# Patient Record
Sex: Female | Born: 1935 | Race: Black or African American | Hispanic: No | Smoking: Former smoker
Health system: Southern US, Community
[De-identification: ages and names within clinical notes are randomized; demographics above are authoritative.]

## PROBLEM LIST (undated history)

## (undated) DIAGNOSIS — E079 Disorder of thyroid, unspecified: Secondary | ICD-10-CM

## (undated) DIAGNOSIS — R569 Unspecified convulsions: Secondary | ICD-10-CM

## (undated) DIAGNOSIS — S065X9A Traumatic subdural hemorrhage with loss of consciousness of unspecified duration, initial encounter: Secondary | ICD-10-CM

## (undated) DIAGNOSIS — B370 Candidal stomatitis: Secondary | ICD-10-CM

## (undated) DIAGNOSIS — D890 Polyclonal hypergammaglobulinemia: Secondary | ICD-10-CM

## (undated) DIAGNOSIS — S065XAA Traumatic subdural hemorrhage with loss of consciousness status unknown, initial encounter: Secondary | ICD-10-CM

## (undated) DIAGNOSIS — R627 Adult failure to thrive: Secondary | ICD-10-CM

## (undated) DIAGNOSIS — F0390 Unspecified dementia without behavioral disturbance: Secondary | ICD-10-CM

## (undated) HISTORY — PX: OTHER SURGICAL HISTORY: SHX169

## (undated) HISTORY — DX: Unspecified convulsions: R56.9

---

## 2006-10-18 ENCOUNTER — Emergency Department (HOSPITAL_COMMUNITY): Admission: EM | Admit: 2006-10-18 | Discharge: 2006-10-18 | Payer: Self-pay | Admitting: Emergency Medicine

## 2011-04-21 ENCOUNTER — Emergency Department (HOSPITAL_COMMUNITY): Payer: Medicare Other

## 2011-04-21 ENCOUNTER — Inpatient Hospital Stay (HOSPITAL_COMMUNITY): Payer: Medicare Other

## 2011-04-21 ENCOUNTER — Inpatient Hospital Stay (HOSPITAL_COMMUNITY)
Admission: EM | Admit: 2011-04-21 | Discharge: 2011-04-28 | DRG: 480 | Disposition: A | Discharge status: Skilled Nursing Facility | Payer: Medicare Other | Attending: Internal Medicine | Admitting: Internal Medicine

## 2011-04-21 DIAGNOSIS — J189 Pneumonia, unspecified organism: Secondary | ICD-10-CM | POA: Diagnosis not present

## 2011-04-21 DIAGNOSIS — D649 Anemia, unspecified: Secondary | ICD-10-CM

## 2011-04-21 DIAGNOSIS — S72143A Displaced intertrochanteric fracture of unspecified femur, initial encounter for closed fracture: Principal | ICD-10-CM | POA: Diagnosis present

## 2011-04-21 DIAGNOSIS — Y92009 Unspecified place in unspecified non-institutional (private) residence as the place of occurrence of the external cause: Secondary | ICD-10-CM

## 2011-04-21 DIAGNOSIS — E876 Hypokalemia: Secondary | ICD-10-CM | POA: Diagnosis not present

## 2011-04-21 DIAGNOSIS — R404 Transient alteration of awareness: Secondary | ICD-10-CM

## 2011-04-21 DIAGNOSIS — N390 Urinary tract infection, site not specified: Secondary | ICD-10-CM | POA: Diagnosis present

## 2011-04-21 DIAGNOSIS — R5381 Other malaise: Secondary | ICD-10-CM | POA: Diagnosis present

## 2011-04-21 DIAGNOSIS — D62 Acute posthemorrhagic anemia: Secondary | ICD-10-CM | POA: Diagnosis not present

## 2011-04-21 DIAGNOSIS — W010XXA Fall on same level from slipping, tripping and stumbling without subsequent striking against object, initial encounter: Secondary | ICD-10-CM | POA: Diagnosis present

## 2011-04-21 DIAGNOSIS — E8809 Other disorders of plasma-protein metabolism, not elsewhere classified: Secondary | ICD-10-CM | POA: Diagnosis present

## 2011-04-21 DIAGNOSIS — R222 Localized swelling, mass and lump, trunk: Secondary | ICD-10-CM

## 2011-04-21 DIAGNOSIS — F0390 Unspecified dementia without behavioral disturbance: Secondary | ICD-10-CM | POA: Diagnosis present

## 2011-04-21 DIAGNOSIS — E039 Hypothyroidism, unspecified: Secondary | ICD-10-CM | POA: Diagnosis present

## 2011-04-21 DIAGNOSIS — I498 Other specified cardiac arrhythmias: Secondary | ICD-10-CM | POA: Diagnosis present

## 2011-04-21 HISTORY — PX: OTHER SURGICAL HISTORY: SHX169

## 2011-04-21 LAB — BASIC METABOLIC PANEL
BUN: 8 mg/dL (ref 6–23)
Calcium: 9.6 mg/dL (ref 8.4–10.5)
Creatinine, Ser: <0.47 mg/dL — ABNORMAL LOW (ref 0.50–1.10)
Glucose, Bld: 115 mg/dL — ABNORMAL HIGH (ref 70–99)
Sodium: 138 mEq/L (ref 135–145)

## 2011-04-21 LAB — POCT I-STAT, CHEM 8
Chloride: 105 mEq/L (ref 96–112)
HCT: 35 % — ABNORMAL LOW (ref 36.0–46.0)
Hemoglobin: 11.9 g/dL — ABNORMAL LOW (ref 12.0–15.0)

## 2011-04-21 LAB — URINALYSIS, ROUTINE W REFLEX MICROSCOPIC
Glucose, UA: NEGATIVE mg/dL
Ketones, ur: NEGATIVE mg/dL
Leukocytes, UA: NEGATIVE
Specific Gravity, Urine: 1.015 (ref 1.005–1.030)
Urobilinogen, UA: 0.2 mg/dL (ref 0.0–1.0)
pH: 7 (ref 5.0–8.0)

## 2011-04-21 LAB — CARDIAC PANEL(CRET KIN+CKTOT+MB+TROPI)
CK, MB: 1.9 ng/mL (ref 0.3–4.0)
Relative Index: INVALID (ref 0.0–2.5)
Relative Index: 2.5 (ref 0.0–2.5)
Total CK: 103 U/L (ref 7–177)
Troponin I: <0.3 ng/mL (ref <0.30)

## 2011-04-21 LAB — CBC
Hemoglobin: 10.5 g/dL — ABNORMAL LOW (ref 12.0–15.0)
MCHC: 30.4 g/dL (ref 30.0–36.0)
MCV: 81.8 fL (ref 78.0–100.0)
Platelets: 347 10*3/uL (ref 150–400)
Platelets: 340 10*3/uL (ref 150–400)
RDW: 16.2 % — ABNORMAL HIGH (ref 11.5–15.5)
WBC: 6.7 10*3/uL (ref 4.0–10.5)
WBC: 7 10*3/uL (ref 4.0–10.5)

## 2011-04-21 LAB — LIPID PANEL
Triglycerides: 79 mg/dL (ref <150)
VLDL: 16 mg/dL (ref 0–40)

## 2011-04-21 LAB — CK TOTAL AND CKMB (NOT AT ARMC): Total CK: 40 U/L (ref 7–177)

## 2011-04-21 LAB — ABO/RH: ABO/RH(D): A POS

## 2011-04-21 LAB — DIFFERENTIAL
Basophils Relative: 0 % (ref 0–1)
Lymphocytes Relative: 30 % (ref 12–46)

## 2011-04-21 LAB — SURGICAL PCR SCREEN: MRSA, PCR: NEGATIVE

## 2011-04-21 LAB — SAMPLE TO BLOOD BANK

## 2011-04-21 LAB — TSH: TSH: 8.002 u[IU]/mL — ABNORMAL HIGH (ref 0.350–4.500)

## 2011-04-21 MED ORDER — IOHEXOL 300 MG/ML  SOLN
100.0000 mL | Freq: Once | INTRAMUSCULAR | Status: AC | PRN
  Administered 2011-04-21: 100 mL via INTRAVENOUS

## 2011-04-22 LAB — COMPREHENSIVE METABOLIC PANEL
ALT: 8 U/L (ref 0–35)
AST: 20 U/L (ref 0–37)
Albumin: 2.6 g/dL — ABNORMAL LOW (ref 3.5–5.2)
CO2: 28 mEq/L (ref 19–32)
Calcium: 8.8 mg/dL (ref 8.4–10.5)
Chloride: 104 mEq/L (ref 96–112)
Creatinine, Ser: 0.58 mg/dL (ref 0.50–1.10)
GFR calc non Af Amer: >60 mL/min (ref >60)
Sodium: 138 mEq/L (ref 135–145)

## 2011-04-22 LAB — IRON AND TIBC
Saturation Ratios: 12 % — ABNORMAL LOW (ref 20–55)
TIBC: 181 ug/dL — ABNORMAL LOW (ref 250–470)
UIBC: 160 ug/dL

## 2011-04-22 LAB — T4, FREE: Free T4: 0.94 ng/dL (ref 0.80–1.80)

## 2011-04-22 LAB — DIFFERENTIAL
Basophils Relative: 0 % (ref 0–1)
Eosinophils Absolute: 0 10*3/uL (ref 0.0–0.7)
Eosinophils Relative: 0 % (ref 0–5)
Neutrophils Relative %: 69 % (ref 43–77)

## 2011-04-22 LAB — HEMOGLOBIN A1C
Hgb A1c MFr Bld: 5.6 % (ref <5.7)
Mean Plasma Glucose: 114 mg/dL (ref <117)

## 2011-04-22 LAB — TSH: TSH: 8.434 u[IU]/mL — ABNORMAL HIGH (ref 0.350–4.500)

## 2011-04-22 LAB — CBC
MCH: 25.9 pg — ABNORMAL LOW (ref 26.0–34.0)
MCV: 81.4 fL (ref 78.0–100.0)
Platelets: 293 10*3/uL (ref 150–400)
RBC: 3.44 MIL/uL — ABNORMAL LOW (ref 3.87–5.11)
RDW: 15.5 % (ref 11.5–15.5)
WBC: 7.7 10*3/uL (ref 4.0–10.5)

## 2011-04-23 ENCOUNTER — Other Ambulatory Visit (HOSPITAL_COMMUNITY): Payer: Medicare Other

## 2011-04-23 DIAGNOSIS — R222 Localized swelling, mass and lump, trunk: Secondary | ICD-10-CM

## 2011-04-23 DIAGNOSIS — R404 Transient alteration of awareness: Secondary | ICD-10-CM

## 2011-04-23 LAB — BASIC METABOLIC PANEL
CO2: 27 mEq/L (ref 19–32)
Calcium: 8.4 mg/dL (ref 8.4–10.5)
Creatinine, Ser: 0.5 mg/dL (ref 0.50–1.10)
GFR calc non Af Amer: >60 mL/min (ref >60)
Sodium: 138 mEq/L (ref 135–145)

## 2011-04-23 LAB — CBC
MCH: 26.7 pg (ref 26.0–34.0)
MCHC: 33 g/dL (ref 30.0–36.0)
MCV: 80.9 fL (ref 78.0–100.0)
Platelets: 238 10*3/uL (ref 150–400)
RBC: 2.77 MIL/uL — ABNORMAL LOW (ref 3.87–5.11)
RDW: 15.6 % — ABNORMAL HIGH (ref 11.5–15.5)

## 2011-04-23 LAB — PREPARE RBC (CROSSMATCH)

## 2011-04-24 DIAGNOSIS — R509 Fever, unspecified: Secondary | ICD-10-CM

## 2011-04-24 DIAGNOSIS — J984 Other disorders of lung: Secondary | ICD-10-CM

## 2011-04-24 LAB — CROSSMATCH
ABO/RH(D): A POS
Antibody Screen: NEGATIVE
Unit division: 0

## 2011-04-24 LAB — BASIC METABOLIC PANEL
CO2: 27 mEq/L (ref 19–32)
Calcium: 8.7 mg/dL (ref 8.4–10.5)
Creatinine, Ser: <0.47 mg/dL — ABNORMAL LOW (ref 0.50–1.10)

## 2011-04-24 LAB — CBC
MCH: 26.3 pg (ref 26.0–34.0)
MCV: 82.8 fL (ref 78.0–100.0)
Platelets: 265 10*3/uL (ref 150–400)
RDW: 15.7 % — ABNORMAL HIGH (ref 11.5–15.5)
WBC: 10.4 10*3/uL (ref 4.0–10.5)

## 2011-04-24 LAB — PROTEIN ELECTROPH W RFLX QUANT IMMUNOGLOBULINS
Alpha-1-Globulin: 7.3 % — ABNORMAL HIGH (ref 2.9–4.9)
Gamma Globulin: 18.9 % — ABNORMAL HIGH (ref 11.1–18.8)
M-Spike, %: NOT DETECTED g/dL
Total Protein ELP: 5.7 g/dL — ABNORMAL LOW (ref 6.0–8.3)

## 2011-04-24 LAB — IGG, IGA, IGM: IgG (Immunoglobin G), Serum: 1130 mg/dL (ref 690–1700)

## 2011-04-24 NOTE — Op Note (Signed)
NAMEMEGHAM, DWYER                 ACCOUNT NO.:  [id number]  MEDICAL RECORD NO.:  [id number]  LOCATION:  1509                         FACILITY:  [facility]  PHYSICIAN:  Vanita Panda. Magnus Ivan, M.D.DATE OF BIRTH:  Nov 19, 1931  DATE OF PROCEDURE:  04/21/2011 DATE OF DISCHARGE:                              OPERATIVE REPORT   PREOPERATIVE DIAGNOSES:  Left acute versus subacute femoral neck/intratrochanteric femur fracture, approximately 6-18 weeks old.  POSTOPERATIVE DIAGNOSES:  Left acute versus subacute femoral neck/intratrochanteric femur fracture, approximately 49-80 weeks old.  PROCEDURE:  Open reduction and internal fixation of left hip/intertochanteric femur fracture.  IMPLANTS:  [employer]hew 10 x 34 left intramedullary nail with 85 mm lag screw and compression screw.  SURGEON:  Vanita Panda. Magnus Ivan, MD  ANESTHESIA:  General.  ANTIBIOTICS:  Ancef IV 1 g.  BLOOD LOSS:  600 cc.  FLUID:  One unit of packed red blood cells.  COMPLICATIONS:  None.  INDICATIONS:  Ms. Mcginniss is a 77 year old female who lives alone except for a younger gentleman who is a son-like figure to her.  He has lived with her the last 10 years.  Over several weeks now, she has had difficulty ambulating and multiple falls.  After finding her out of bed and on the ground again early this morning, he brought her to the emergency room.  She was found to have a left hip fracture.  There was early callus formation, so I suggested that this fracture had the subacute component to it as well.  I talked to the patient's caregiver at length and he said that she had not been walking for about 3 weeks and that leg had been swelling and she had been favoring it and he had tried to do the best he could to take care of it at home.  It is at the point where a surgical intervention is warranted.  I explained to him the very difficult nature of fracture such as this, in fact it would need a very large incision and  instrumentation to fix this, but likely I would still keep her nonweightbearing for sometime.  The risks and benefits of this were explained to him in detail and the desire was to proceed with surgery.  PROCEDURE DESCRIPTION:  After informed consent was obtained, appropriate left hip was marked, she was brought to the operating room, and general anesthesia was obtained while she was on the stretcher.  She was then placed on a fracture table with the operative leg in midline skeletal traction and a perineal post placed and the right hip flexed and abducted out of the field with a stirrup placed and appropriate padding. Under direct fluoroscopic guidance, I was able to pull traction and found the fracture to be somewhat loose enough to get it in decent alignment.  We then prepped the left hip with DuraPrep and sterile drapes.  A time-out was called and she was identified as the correct patient and correct left hip.  I then made a large extensile excision due to the nature of the greater trochanteric piece and dissected down and divided the IT band.  Components of the fracture had healed and it was difficult  to get a lot of this reduced, but I got in close enough of vicinity to be able to put a guide pin from the greater trochanter and drill this down into the femoral shaft.  I then opened up the femoral canal with initiating reamer.  Prior to surgery, I was able to make a size measurement to determine the size rod.  I then placed an intramedullary nail in antegrade fashion from the greater trochanter down into the femoral canal, verified under fluoroscopic guidance. Through a separate small incision further down the thigh, we were able to place a guide pin traversing the fracture and the femoral neck into the head.  We then drilled for an 85 mm lag screw and placed this lag screw in the compression format with all traction out without difficulty.  The hip did move as a unit following this.   I was pleased with the reduction that we were able to obtain given the nature of this fracture.  I then copiously irrigated the tissues and closed the deep tissue with #1 Vicryl followed by 2-0 Vicryl in subcutaneous tissue and interrupted staples on the skin.  A well-padded sterile dressing was then applied.  She was awakened, extubated, and taken off the fracture table in stable condition.  Postoperatively, I will not allow any type of weightbearing while she heals this and she will likely need skilled nursing placement.     Vanita Panda. Magnus Ivan, M.D.     CYB/MEDQ  D:  04/21/2011  T:  04/22/2011  Job:  [id number]  Electronically Signed by Doneen Poisson M.D. on 04/24/2011 06:03:20 PM

## 2011-04-25 LAB — CBC
MCH: 26.5 pg (ref 26.0–34.0)
MCV: 83.4 fL (ref 78.0–100.0)
Platelets: 296 10*3/uL (ref 150–400)
RDW: 16 % — ABNORMAL HIGH (ref 11.5–15.5)

## 2011-04-25 LAB — URINE CULTURE
Colony Count: >=100000
Culture  Setup Time: 201207211107

## 2011-04-25 LAB — CARDIAC PANEL(CRET KIN+CKTOT+MB+TROPI): Total CK: 324 U/L — ABNORMAL HIGH (ref 7–177)

## 2011-04-25 LAB — BASIC METABOLIC PANEL
Calcium: 8.5 mg/dL (ref 8.4–10.5)
Creatinine, Ser: 0.52 mg/dL (ref 0.50–1.10)
GFR calc Af Amer: >60 mL/min (ref >60)
GFR calc non Af Amer: >60 mL/min (ref >60)

## 2011-04-25 LAB — MAGNESIUM: Magnesium: 2.3 mg/dL (ref 1.5–2.5)

## 2011-04-26 ENCOUNTER — Inpatient Hospital Stay (HOSPITAL_COMMUNITY): Payer: Medicare Other

## 2011-04-26 LAB — CARDIAC PANEL(CRET KIN+CKTOT+MB+TROPI)
CK, MB: 2.1 ng/mL (ref 0.3–4.0)
Relative Index: 0.9 (ref 0.0–2.5)
Relative Index: 0.9 (ref 0.0–2.5)
Total CK: 234 U/L — ABNORMAL HIGH (ref 7–177)
Troponin I: <0.3 ng/mL (ref <0.30)
Troponin I: <0.3 ng/mL (ref <0.30)

## 2011-04-26 LAB — BASIC METABOLIC PANEL
BUN: 8 mg/dL (ref 6–23)
Calcium: 8.8 mg/dL (ref 8.4–10.5)
Chloride: 104 mEq/L (ref 96–112)
Creatinine, Ser: 0.52 mg/dL (ref 0.50–1.10)
GFR calc Af Amer: >60 mL/min (ref >60)
GFR calc non Af Amer: >60 mL/min (ref >60)

## 2011-04-26 LAB — FERRITIN: Ferritin: 304 ng/mL — ABNORMAL HIGH (ref 10–291)

## 2011-04-26 LAB — URINALYSIS, ROUTINE W REFLEX MICROSCOPIC
Bilirubin Urine: NEGATIVE
Leukocytes, UA: NEGATIVE
Nitrite: NEGATIVE
Specific Gravity, Urine: 1.019 (ref 1.005–1.030)
Urobilinogen, UA: 1 mg/dL (ref 0.0–1.0)
pH: 7.5 (ref 5.0–8.0)

## 2011-04-26 LAB — CBC
Hemoglobin: 7.8 g/dL — ABNORMAL LOW (ref 12.0–15.0)
MCHC: 31.7 g/dL (ref 30.0–36.0)
RBC: 2.96 MIL/uL — ABNORMAL LOW (ref 3.87–5.11)

## 2011-04-26 LAB — IRON AND TIBC
Saturation Ratios: 22 % (ref 20–55)
UIBC: 118 ug/dL

## 2011-04-26 LAB — MAGNESIUM: Magnesium: 2.2 mg/dL (ref 1.5–2.5)

## 2011-04-27 LAB — HEMOGLOBIN AND HEMATOCRIT, BLOOD: HCT: 27.1 % — ABNORMAL LOW (ref 36.0–46.0)

## 2011-04-28 LAB — URINE CULTURE
Colony Count: NO GROWTH
Culture  Setup Time: 201207270136

## 2011-04-28 NOTE — H&P (Signed)
Kristi Webb, Kristi Webb                 ACCOUNT NO.:  [id number]  MEDICAL RECORD NO.:  [id number]  LOCATION:  1509                         FACILITY:  [facility]  PHYSICIAN:  Della Goo, M.D. DATE OF BIRTH:  08-Mar-1932  DATE OF ADMISSION:  04/21/2011 DATE OF DISCHARGE:                             HISTORY & PHYSICAL   DATE OF ADMISSION:  April 21, 2011.  PRIMARY CARE PHYSICIAN:  None.  CHIEF COMPLAINT:  Fall, left hip pain.  HISTORY OF PRESENT ILLNESS:  This is a 77 year old female, who was brought to the Emergency Department after suffering a fall this evening at about 1:00 a.m.  Her son found her on the floor and patient could not get up.  Patient has had increased falls over the past few weeks. Patient's son states that he has found her on the floor many times and has had to take her back up.  He bought a wheelchair this past week and has had her in the wheelchair secondary to her having difficulty walking.Marland Kitchen  He states that in the past few weeks when she has fallen, he has found swollen areas on her hips and has placed ice packs on them and these areas would get better.  He reports this evening when he picked her up, she was more agitated and uncomfortable, so he brought her to the Emergency Department for further evaluation.  Patient was seen in the Emergency Department and she was evaluated and found to be confused.  When asked, the son states that she has had worsening confusion and problems with her memory over the past 6-8 months.  Her son also reports that she had been in good health prior to this and had not been going to a physician or had not had to be hospitalized.  PAST MEDICAL HISTORY:  Prior to this, none.  PAST SURGICAL HISTORY:  None.  MEDICATIONS: 1. Centrum Silver. 2. Multivitamin. 3. Vitamin D3. 4. Milk thistle. 5. Lecithin. 6. Aleve. 7. Aspirin.  ALLERGIES:  No known drug allergies.  SOCIAL HISTORY:  Patient is a widow.  She retired from [employer].  She had worked there for 43 years.  She is a former smoker.  She quit recently 3 months ago, prior to this, she had smoked for 35 years, one pack a day.  She is a nondrinker.  No history of illicit drug usage.  FAMILY HISTORY:  Noncontributory.  Her son is her primary caretaker. She lives with her son.  REVIEW OF SYSTEMS:  Unable to obtain.  PHYSICAL EXAMINATION FINDINGS:  GENERAL:  This is a 77 year old elderly, thin, African American female, who is in mild discomfort, but no acute distress. VITAL SIGNS:  Temperature 99.0, blood pressure 160/65, heart rate 84, respirations 16, O2 saturation 100%. HEENT EXAMINATION:  Normocephalic, atraumatic.  Pupils are equally round and reactive to light.  Extraocular movements are intact.  Funduscopic benign.  There is no scleral icterus.  Nares are patent bilaterally. Oropharynx is clear. NECK:  Supple  full range of motion.  No thyromegaly, adenopathy or jugular venous distention. CARDIOVASCULAR:  Regular rate and rhythm.  No murmurs, gallops or rubs appreciated. LUNGS:  Clear to auscultation bilaterally.  Breathing is unlabored. Chest wall excursion is symmetric. ABDOMEN:  Positive bowel sounds, soft, nontender, nondistended.  No hepatosplenomegaly. EXTREMITIES:  Without cyanosis, clubbing or edema. NEUROLOGIC EXAMINATION:  Generalized weakness.  Patient is able to move all four of her extremities; however, has limitation of movement of the left leg secondary to her pain from her hip fracture. SKIN EXAMINATION:  Patient has increased scaling patches in her eyebrow ridges and face and overall dry doughy skin.  LABORATORY STUDIES:  White blood cell count 6.7, hemoglobin 10.5, hematocrit 34.5, MCV 82.3, platelets 347,000, neutrophils 59% lymphocytes 30%.  Sodium 142, potassium 4.5, chloride 105, CO2 of 26, BUN 9, creatinine 0.50 and glucose 125.  DIAGNOSTIC STUDIES:  X-rays of the left hip revealed a left femoral neck and  intratrochanteric fracture, superior displacement of the distal component of the inferior pubic ramus and this appears to be a remote fracture.  Chest x-ray reveals a 3.8 cm mass-like consolidation over the right mid lung and CT scan of the chest reveals fluid attenuation within the right upper lobe along the major fissure associated cavitation medially, mild scattered bolus changes.  No pneumothorax.  No pleural effusion elsewhere.  Mild dependent atelectasis, small hiatal hernia. Scattered atherosclerotic calcifications of the aortic arch and aortic root and valve, mild ectasia of the aorta.  CT scan of the brain reveals no evidence of acute hemorrhage or overt hydrocephalus mass lesion or extra-axial fluid collections.  Chronic microangiopathic changes are seen, however.  ASSESSMENT:  [age]-year-old female being admitted with: 1. Left hip fracture, status post fall. 2. Lung mass in the right upper lobe. 3. Elevated blood pressure without a history of hypertension.  Please     note, this may be secondary to pain. 4. Normocytic anemia. 5. Probable dementia.  PLAN:  Patient will be admitted to the telemetry area for monitoring. Cardiac enzymes will be performed along with neurologic checks.  The orthopedic service has been consulted.  patient will be seen by the consultant, Dr. Allie Bossier for further evaluation of surgical  options.  A pulmonary consultation will also be placed for further evaluation of the lung mass.  Pain control therapy has been ordered for patient's hip fracture pain.  SCDs have been ordered for DVT  prophylaxis and patient's code status is a full code at this time.  An anemia panel will be ordered.     Della Goo, M.D.     HJ/MEDQ  D:  04/21/2011  T:  04/21/2011  Job:  [id number]  Electronically Signed by Della Goo M.D. on 04/28/2011 08:26:43 PM

## 2011-04-30 NOTE — Discharge Summary (Signed)
Kristi Webb, Kristi Webb                 ACCOUNT NO.:  [id number]  MEDICAL RECORD NO.:  [id number]  LOCATION:  1509                         FACILITY:  [facility]  PHYSICIAN:  Baltazar Najjar, MD     DATE OF BIRTH:  03-22-1932  DATE OF ADMISSION:  04/21/2011 DATE OF DISCHARGE:                              DISCHARGE SUMMARY   FINAL DISCHARGE DIAGNOSES: 1. Left intertrochanteric and femoral neck fractures status post open     reduction and internal fixation. 2. Right upper lobe mass on x-ray, rule out malignancy. 3. Pneumonia, possibly postobstructive pneumonia. 4. Recurrent fevers secondary to the above. 5. Acute blood loss anemia. 6. Subclinical hypothyroidism 7. Transient supraventricular tachycardia, resolved. 8. Hypokalemia, repleted. 9. Urinary tract infection. 10.Dementia.  CONSULTATIONS: 1. Dr. Magnus Ivan from Orthopedic Service. 2. Dr. Ninetta Lights from Infectious Disease 3. Dr. Sherene Sires from Pulmonary Service.  RADIOLOGICAL STUDIES:1. Left hip x-ray on April 21, 2011, showed comminuted left     intertrochanteric and femoral neck fracture. 2. Chest x-ray on April 21, 2011, showed 3.8-cm mass-like consolidation     within the right mid lung, recommend chest CT. 3. Head CT showed chronic changes.  White matter hypodensities, most     likely chronic microangiopathic changes.  No definitive acute     intracranial abnormality. 4. Chest CT showed oval-shaped fluid attenuation along the major     fissure within the right upper lung with cavitated medial     component.  This may represent fluid within a previous cavity which     is favored, superimposed acute infection (including mycobacterial     cannot be entirely excluded in the appropriate clinical setting).     Furthermore while no enhancement is appreciated, it is not possible     to exclude an underlying mass which could be further evaluated with     a PET CT. 5. Hip x-ray showed internal fixation of the left hip comminuted  intertrochanteric and femoral neck fracture in near anatomic     alignment. 6. Chest x-ray on April 26, 2011, showed mild vascular congestion.     Suboptimal film due to rotation.  Right upper lobe process poorly     demonstrated.  BRIEF ADMITTING HISTORY:  Please refer to H and P for more details.  In summary, [name] is a 77 year old African-American woman who presented to the ER with left hip pain after she sustained a fall.  Her son found her on the floor.  The patient also noted to have increasing falls over the past few weeks prior to the admission.  Please refer to the H and P for more details.  HOSPITAL COURSE: 1. Workup in the emergency room revealed left hip fracture and right     upper lobe mass as above on imaging studies.  The patient was seen     by Orthopedic Surgery Dr. Doneen Poisson and underwent open     reduction and internal fixation of her left hip fracture. 2. Right upper lobe mass.  The patient was seen by post Pulmonary     Service and Infectious Disease Service and is being treated with     Zosyn for possible postobstructive pneumonia  that was discussed     with Dr. Ninetta Lights who recommended to change antibiotic to Augmentin     on discharge.  The patient was also seen by Pulmonary Serve Dr.     Sherene Sires who recommended to see the patient with family in his office     to determine the goals of care regarding further workup of the     right upper lobe mass, for him to better explain the risks and     benefits of diagnosis and treatment.  The patient will need to     follow up with Dr. Sherene Sires in the office upon discharge. 3. Hypokalemia that was repleted.  Magnesium level was also checked     that was normal range. 4. Transient SVT most likely secondary to hypokalemia that has quickly     resolved with repletion of potassium.  There was no recurrence.  If     there is any further concern in the future, that will need to be     addressed by the PCP.   However, I think it is most likely related     to hypokalemia. 5. Subclinical hypothyroidism.  The patient was found to have high TSH     with normal free T4 and T3 and was started on Synthroid.  She will     need repeat thyroid function test within 4 to 6 weeks to adjust the     dose if needed. 6. Acute blood loss anemia.  Required blood transfusion during this     hospitalization.  Currently his hemoglobin stabilized. 7. Dementia. 8. Deconditioning.  The patient was seen by Physical Therapy during     this hospitalization and she will be discharged to skilled nursing     facility.  The patient seen and examined by me today and she is stable for discharge to skilled nursing facility.  DISCHARGE MEDICATIONS: 1. Augmentin 875 mg/125 mg one tablet p.o. twice daily for 5 more days     to complete 10 days of antibiotics. 2. Synthroid 25 mcg p.o. daily. 3. Oxycodone 5 mg IR tablet p.o. q.8 h p.r.n. for 7 days supply. 4. MiraLax powder 17 g p.o. daily as needed. 5. Senokot S tablets 2 tablets p.o. daily as needed p.r.n. 6. Aleve 220 mg 1 tablet p.o. q.8 h p.r.n. 7. Aspirin 81 mg tablets 1 tablet p.o. daily. 8. Multivitamin 1 tablet p.o. daily. 9. Vitamin D3 1000 units 1 tablet p.o. daily.  DISCHARGE INSTRUCTIONS: 1. The patient to continue above medications as prescribed. 2. The patient to follow with Dr. Magnus Ivan in the office as scheduled     and to follow with Dr. Sherene Sires as well as PCP preferably within 1 week     of discharge or ASAP. 3. Any new symptoms or worsening of any symptoms to be reported to PCP     or the patient can be brought to the ER if that is necessary.  CONDITION ON DISCHARGE:  Stable.  As noted from the chart, the patient does not have a PCP.  I will ask the case manager to see if she can assist the patient establishing a PCP for followup.          ______________________________ Baltazar Najjar, MD     SA/MEDQ  D:  04/27/2011  T:  04/27/2011  Job:   [id number]  cc:   Charlaine Dalton. Sherene Sires, MD, FCCP [address] [place] [address]  Vanita Panda. Magnus Ivan, M.D. Fax: [phone]  Electronically  Signed by Hannah Beat MD on 04/30/2011 03:21:10 PM

## 2011-04-30 NOTE — Discharge Summary (Signed)
  [name], Kristi Webb                 ACCOUNT NO.:  [id number]  MEDICAL RECORD NO.:  [id number]  LOCATION:  1509                         FACILITY:  [facility]  PHYSICIAN:  Baltazar Najjar, MD     DATE OF BIRTH:  [birth year]  DATE OF ADMISSION:  04/21/2011 DATE OF DISCHARGE:                              DISCHARGE SUMMARY   ADDENDUM: The patient is seen and examined by me today.  She is doing well. Denies any pain or any other complaints.  She is stable for discharge to skilled nursing facility today, [facility].  Son at bedside who was agreeable for the discharge to this facility after he tore it yesterday.  LABORATORY DATA:  SPEP with IFE was ordered during this hospitalization that showed some evidence of polyclonal gammopathy.  IgA level was elevated at 448 and on protein electrophoresis, she does have elevated alpha-1 and beta-2 and gammaglobulin.  Immunofixation showed no monoclonal protein identified.  Any further workup to be deferred to PCP and other consultant physicians involved in her care.          ______________________________ Baltazar Najjar, MD     SA/MEDQ  D:  04/28/2011  T:  04/28/2011  Job:  [id number]  cc:   Charlaine Dalton. Sherene Sires, MD, FCCP [address] [place] [address]  Vanita Panda. Magnus Ivan, M.D. Fax: [phone]  Rounding Physician [facility]  Electronically Signed by Hannah Beat MD on 04/30/2011 03:22:03 PM

## 2011-05-03 LAB — CULTURE, BLOOD (ROUTINE X 2)
Culture  Setup Time: 201207270134
Culture: NO GROWTH
Culture: NO GROWTH

## 2011-07-25 ENCOUNTER — Other Ambulatory Visit (HOSPITAL_COMMUNITY): Payer: Self-pay | Admitting: Family Medicine

## 2011-08-01 ENCOUNTER — Ambulatory Visit (HOSPITAL_COMMUNITY): Payer: Medicare Other

## 2011-08-01 ENCOUNTER — Inpatient Hospital Stay (HOSPITAL_COMMUNITY): Admission: RE | Admit: 2011-08-01 | Payer: Medicare Other | Source: Ambulatory Visit

## 2011-11-29 DIAGNOSIS — I10 Essential (primary) hypertension: Secondary | ICD-10-CM | POA: Insufficient documentation

## 2012-05-28 ENCOUNTER — Encounter (HOSPITAL_COMMUNITY): Payer: Self-pay | Admitting: Emergency Medicine

## 2012-05-28 ENCOUNTER — Emergency Department (HOSPITAL_COMMUNITY): Payer: Medicare Other

## 2012-05-28 ENCOUNTER — Inpatient Hospital Stay (HOSPITAL_COMMUNITY)
Admission: EM | Admit: 2012-05-28 | Discharge: 2012-06-04 | DRG: 872 | Disposition: A | Discharge status: Skilled Nursing Facility | Payer: Medicare Other | Attending: Internal Medicine | Admitting: Internal Medicine

## 2012-05-28 DIAGNOSIS — A4189 Other specified sepsis: Principal | ICD-10-CM | POA: Diagnosis present

## 2012-05-28 DIAGNOSIS — L89152 Pressure ulcer of sacral region, stage 2: Secondary | ICD-10-CM | POA: Diagnosis present

## 2012-05-28 DIAGNOSIS — Z66 Do not resuscitate: Secondary | ICD-10-CM | POA: Diagnosis not present

## 2012-05-28 DIAGNOSIS — R627 Adult failure to thrive: Secondary | ICD-10-CM | POA: Diagnosis present

## 2012-05-28 DIAGNOSIS — E079 Disorder of thyroid, unspecified: Secondary | ICD-10-CM | POA: Diagnosis present

## 2012-05-28 DIAGNOSIS — R4182 Altered mental status, unspecified: Secondary | ICD-10-CM | POA: Diagnosis present

## 2012-05-28 DIAGNOSIS — L8991 Pressure ulcer of unspecified site, stage 1: Secondary | ICD-10-CM | POA: Diagnosis present

## 2012-05-28 DIAGNOSIS — D72829 Elevated white blood cell count, unspecified: Secondary | ICD-10-CM | POA: Diagnosis present

## 2012-05-28 DIAGNOSIS — L89509 Pressure ulcer of unspecified ankle, unspecified stage: Secondary | ICD-10-CM | POA: Diagnosis present

## 2012-05-28 DIAGNOSIS — E870 Hyperosmolality and hypernatremia: Secondary | ICD-10-CM | POA: Diagnosis present

## 2012-05-28 DIAGNOSIS — R000 Tachycardia, unspecified: Secondary | ICD-10-CM | POA: Diagnosis present

## 2012-05-28 DIAGNOSIS — E46 Unspecified protein-calorie malnutrition: Secondary | ICD-10-CM | POA: Diagnosis present

## 2012-05-28 DIAGNOSIS — N390 Urinary tract infection, site not specified: Secondary | ICD-10-CM | POA: Diagnosis present

## 2012-05-28 DIAGNOSIS — R6510 Systemic inflammatory response syndrome (SIRS) of non-infectious origin without acute organ dysfunction: Secondary | ICD-10-CM | POA: Diagnosis present

## 2012-05-28 DIAGNOSIS — L89109 Pressure ulcer of unspecified part of back, unspecified stage: Secondary | ICD-10-CM | POA: Diagnosis present

## 2012-05-28 DIAGNOSIS — E873 Alkalosis: Secondary | ICD-10-CM | POA: Diagnosis present

## 2012-05-28 DIAGNOSIS — Z8249 Family history of ischemic heart disease and other diseases of the circulatory system: Secondary | ICD-10-CM

## 2012-05-28 DIAGNOSIS — L8992 Pressure ulcer of unspecified site, stage 2: Secondary | ICD-10-CM | POA: Diagnosis present

## 2012-05-28 DIAGNOSIS — Z79899 Other long term (current) drug therapy: Secondary | ICD-10-CM

## 2012-05-28 DIAGNOSIS — A419 Sepsis, unspecified organism: Secondary | ICD-10-CM | POA: Diagnosis present

## 2012-05-28 DIAGNOSIS — R6520 Severe sepsis without septic shock: Secondary | ICD-10-CM | POA: Diagnosis present

## 2012-05-28 DIAGNOSIS — B370 Candidal stomatitis: Secondary | ICD-10-CM | POA: Diagnosis present

## 2012-05-28 DIAGNOSIS — R0682 Tachypnea, not elsewhere classified: Secondary | ICD-10-CM | POA: Diagnosis present

## 2012-05-28 DIAGNOSIS — E039 Hypothyroidism, unspecified: Secondary | ICD-10-CM | POA: Diagnosis present

## 2012-05-28 DIAGNOSIS — IMO0002 Reserved for concepts with insufficient information to code with codable children: Secondary | ICD-10-CM

## 2012-05-28 DIAGNOSIS — R599 Enlarged lymph nodes, unspecified: Secondary | ICD-10-CM | POA: Diagnosis present

## 2012-05-28 DIAGNOSIS — F0280 Dementia in other diseases classified elsewhere without behavioral disturbance: Secondary | ICD-10-CM | POA: Diagnosis present

## 2012-05-28 DIAGNOSIS — F0390 Unspecified dementia without behavioral disturbance: Secondary | ICD-10-CM | POA: Diagnosis present

## 2012-05-28 DIAGNOSIS — R5381 Other malaise: Secondary | ICD-10-CM | POA: Diagnosis present

## 2012-05-28 DIAGNOSIS — Z87891 Personal history of nicotine dependence: Secondary | ICD-10-CM

## 2012-05-28 HISTORY — DX: Disorder of thyroid, unspecified: E07.9

## 2012-05-28 HISTORY — DX: Candidal stomatitis: B37.0

## 2012-05-28 HISTORY — DX: Traumatic subdural hemorrhage with loss of consciousness of unspecified duration, initial encounter: S06.5X9A

## 2012-05-28 HISTORY — DX: Unspecified dementia, unspecified severity, without behavioral disturbance, psychotic disturbance, mood disturbance, and anxiety: F03.90

## 2012-05-28 HISTORY — DX: Traumatic subdural hemorrhage with loss of consciousness status unknown, initial encounter: S06.5XAA

## 2012-05-28 HISTORY — DX: Polyclonal hypergammaglobulinemia: D89.0

## 2012-05-28 HISTORY — DX: Adult failure to thrive: R62.7

## 2012-05-28 LAB — POCT I-STAT 3, ART BLOOD GAS (G3+)
Bicarbonate: 22.9 mEq/L (ref 20.0–24.0)
O2 Saturation: 96 %
pCO2 arterial: 33.7 mmHg — ABNORMAL LOW (ref 35.0–45.0)
pO2, Arterial: 76 mmHg — ABNORMAL LOW (ref 80.0–100.0)

## 2012-05-28 LAB — CBC WITH DIFFERENTIAL/PLATELET
Hemoglobin: 16.4 g/dL — ABNORMAL HIGH (ref 12.0–15.0)
Lymphocytes Relative: 20 % (ref 12–46)
Lymphs Abs: 2.5 10*3/uL (ref 0.7–4.0)
Neutro Abs: 9.3 10*3/uL — ABNORMAL HIGH (ref 1.7–7.7)
Neutrophils Relative %: 74 % (ref 43–77)
Platelets: 206 10*3/uL (ref 150–400)
RBC: 6.09 MIL/uL — ABNORMAL HIGH (ref 3.87–5.11)
WBC: 12.6 10*3/uL — ABNORMAL HIGH (ref 4.0–10.5)

## 2012-05-28 LAB — URINALYSIS, ROUTINE W REFLEX MICROSCOPIC
Glucose, UA: NEGATIVE mg/dL
Hgb urine dipstick: NEGATIVE
Specific Gravity, Urine: 1.025 (ref 1.005–1.030)
Urobilinogen, UA: 1 mg/dL (ref 0.0–1.0)
pH: 7 (ref 5.0–8.0)

## 2012-05-28 LAB — PROTIME-INR: INR: 1.39 (ref 0.00–1.49)

## 2012-05-28 LAB — BASIC METABOLIC PANEL
BUN: 39 mg/dL — ABNORMAL HIGH (ref 6–23)
BUN: 36 mg/dL — ABNORMAL HIGH (ref 6–23)
CO2: 24 mEq/L (ref 19–32)
Calcium: 9.7 mg/dL (ref 8.4–10.5)
Chloride: 134 mEq/L (ref 96–112)
Chloride: >130 mEq/L (ref 96–112)
Creatinine, Ser: 0.76 mg/dL (ref 0.50–1.10)
GFR calc Af Amer: >90 mL/min (ref >90)
GFR calc Af Amer: >90 mL/min (ref >90)
GFR calc non Af Amer: 57 mL/min — ABNORMAL LOW (ref >90)
GFR calc non Af Amer: 78 mL/min — ABNORMAL LOW (ref >90)
GFR calc non Af Amer: 79 mL/min — ABNORMAL LOW (ref >90)
Glucose, Bld: 133 mg/dL — ABNORMAL HIGH (ref 70–99)
Glucose, Bld: 123 mg/dL — ABNORMAL HIGH (ref 70–99)
Potassium: 4.2 mEq/L (ref 3.5–5.1)
Potassium: 4 mEq/L (ref 3.5–5.1)
Potassium: 4 mEq/L (ref 3.5–5.1)
Sodium: 174 mEq/L (ref 135–145)
Sodium: 170 mEq/L (ref 135–145)

## 2012-05-28 LAB — HEPATIC FUNCTION PANEL
Alkaline Phosphatase: 178 U/L — ABNORMAL HIGH (ref 39–117)
Bilirubin, Direct: 0.1 mg/dL (ref 0.0–0.3)
Total Bilirubin: 0.6 mg/dL (ref 0.3–1.2)

## 2012-05-28 LAB — PHOSPHORUS: Phosphorus: 3 mg/dL (ref 2.3–4.6)

## 2012-05-28 LAB — URINE MICROSCOPIC-ADD ON

## 2012-05-28 LAB — LACTIC ACID, PLASMA: Lactic Acid, Venous: 1.9 mmol/L (ref 0.5–2.2)

## 2012-05-28 LAB — MAGNESIUM: Magnesium: 3.1 mg/dL — ABNORMAL HIGH (ref 1.5–2.5)

## 2012-05-28 LAB — SODIUM, URINE, RANDOM: Sodium, Ur: 24 mEq/L

## 2012-05-28 MED ORDER — SODIUM CHLORIDE 0.9 % IV SOLN
Freq: Once | INTRAVENOUS | Status: AC
  Administered 2012-05-28: 13:00:00 via INTRAVENOUS

## 2012-05-28 MED ORDER — CHLORHEXIDINE GLUCONATE 0.12 % MT SOLN
15.0000 mL | Freq: Two times a day (BID) | OROMUCOSAL | Status: DC
  Administered 2012-05-28 – 2012-06-03 (×12): 15 mL via OROMUCOSAL
  Filled 2012-05-28 (×12): qty 15

## 2012-05-28 MED ORDER — ACETAMINOPHEN 650 MG RE SUPP: 325.0000 mg | RECTAL | Status: DC | PRN

## 2012-05-28 MED ORDER — SODIUM CHLORIDE 0.9 % IV BOLUS (SEPSIS)
2000.0000 mL | Freq: Once | INTRAVENOUS | Status: AC
  Administered 2012-05-28: 2000 mL via INTRAVENOUS

## 2012-05-28 MED ORDER — DONEPEZIL HCL 10 MG PO TABS
10.0000 mg | ORAL_TABLET | Freq: Every day | ORAL | Status: DC
  Filled 2012-05-28: qty 1

## 2012-05-28 MED ORDER — BIOTENE DRY MOUTH MT LIQD
15.0000 mL | Freq: Four times a day (QID) | OROMUCOSAL | Status: DC
  Administered 2012-05-29 – 2012-06-03 (×24): 15 mL via OROMUCOSAL

## 2012-05-28 MED ORDER — POTASSIUM CHLORIDE 2 MEQ/ML IV SOLN
INTRAVENOUS | Status: DC
  Administered 2012-05-28 – 2012-05-29 (×3): via INTRAVENOUS
  Filled 2012-05-28 (×7): qty 1000

## 2012-05-28 MED ORDER — SODIUM CHLORIDE 0.9 % IJ SOLN: 3.0000 mL | Freq: Two times a day (BID) | INTRAMUSCULAR | Status: DC

## 2012-05-28 MED ORDER — NYSTATIN 100000 UNIT/ML MT SUSP
500000.0000 [IU] | Freq: Four times a day (QID) | OROMUCOSAL | Status: DC
  Filled 2012-05-28 (×2): qty 5

## 2012-05-28 NOTE — Progress Notes (Signed)
Lab called with critical  Values :  Na 170 and Cl > 130.  Paged Dr. Andre Lefort.  Values were expected.

## 2012-05-28 NOTE — Progress Notes (Signed)
INITIAL ADULT NUTRITION ASSESSMENT Date: 05/28/2012   Time: 4:37 PM Reason for Assessment: Consult   INTERVENTION: 1. Recommend monitoring magnesium, potassium, and phosphorus daily for at least 3 days, MD to replete as needed, as pt is at risk for refeeding syndrome given poor intake, underweight, weight loss and likely malnourished.   2. If diet is able to advance, will add appropriate nutrition supplements.  3. If unable to advance diet, recommend early nutrition support intervention with slow advancement   4. RD will continue to follow    DOCUMENTATION CODES Per approved criteria  -Underweight    ASSESSMENT: Female 78 y.o.  Dx: AMS  Hx:  Past Medical History  Diagnosis Date  . Dementia     worse since 2012  . Thrush   . Thyroid disease   . FTT (failure to thrive) in adult     Related Meds:     . sodium chloride   Intravenous Once  . donepezil  10 mg Oral Daily  . nystatin  500,000 Units Oral QID  . sodium chloride  2,000 mL Intravenous Once  . sodium chloride  3 mL Intravenous Q12H     Ht: 5\' 9"  (175.3 cm)  Wt: 117 lb 8.1 oz (53.3 kg)  Ideal Wt: 66 kg  % Ideal Wt: 81%  Usual Wt: Family unsure of UBW Wt Readings from Last 5 Encounters:  05/28/12 117 lb 8.1 oz (53.3 kg)   % Usual Wt: --  Body mass index is 17.35 kg/(m^2). Pt is underweight per current BMI   Food/Nutrition Related Hx: family unsure of intake, has lost weight, unsure of amount   Labs:  CMP     Component Value Date/Time   NA 174* 05/28/2012 1104   K 4.2 05/28/2012 1104   CL 134* 05/28/2012 1104   CO2 24 05/28/2012 1104   GLUCOSE 133* 05/28/2012 1104   BUN 44* 05/28/2012 1104   CREATININE 0.93 05/28/2012 1104   CALCIUM 9.9 05/28/2012 1104   PROT 6.0 04/22/2011 0540   ALBUMIN 2.6* 04/22/2011 0540   AST 20 04/22/2011 0540   ALT 8 04/22/2011 0540   ALKPHOS 167* 04/22/2011 0540   BILITOT 0.4 04/22/2011 0540   GFRNONAA 57* 05/28/2012 1104   GFRAA 66* 05/28/2012 1104    No intake or output  data in the 24 hours ending 05/28/12 1639   Diet Order: NPO  Supplements/Tube Feeding: none  IVF:    dextrose 5 % and 0.45% NaCl 1,000 mL with potassium chloride 20 mEq infusion    Estimated Nutritional Needs:   Kcal: 1600-1850  Protein: 66-80 gm  Fluid: 1.6-1.9 L   Pt admitted with AMS, sacral ulcer, dehydration, hypernatremia and weight loss per notes.   RD spoke with pt's daughter, states the pt has not eaten anything for the past few days, hardly taken any water. Does not know what the pt's UBW was, but that she "has lost a lot of weight" RD was unable to physically assess the pt at this time, pt was tighly curled under blankets and had several family member and staff around.   Pt likely meets criteria for malnutrition but unable to obtain intake or weight hx.   Given likely malnutrition, dehydration, weight loss and poor intake prior to admission, pt is at high risk for refeeding syndrome.   Per orders, SLP to see pt before beginning diet. Recommend slow intiation of diet if able to pass swallow evaluation given risk of refeeding.  NUTRITION DIAGNOSIS: -Inadequate oral intake (NI-2.1).  Status: Ongoing  RELATED TO: AMS  AS EVIDENCE BY: Body mass index is 17.35 kg/(m^2). and family reported weight loss  MONITORING/EVALUATION(Goals): Goal: Diet advance or intiation of nutrition support to provide >/=905 estimated nutrition needs  Monitor: refeeding syndrome, weight, labs, I/O's, diet advance   EDUCATION NEEDS: -No education needs identified at this time    Clarene Duke RD, LDN Pager [phone] After Hours pager [phone]  05/28/2012, 4:37 PM

## 2012-05-28 NOTE — H&P (Signed)
Hospital Admission Note Date: 05/28/2012  Patient name: Kristi Webb Medical record number: [id number] Date of birth: 30-Aug-1932 Age: 78 y.o. Gender: female PCP: [facility]   Medical Service: Internal Medicine  Attending physician: Dr. Meredith Webb    1st Contact: Kristi Webb (medical student) Pager: [phone] 2nd Contact: Kristi Maxim MD [phone] 3rd Contact: Kristi Herbert MD Pager: [phone]  After 5 pm or weekends: 1st Contact:  Pager: [phone] 2nd Contact:  Pager: [phone]  Chief Complaint: Altered Mental Status  History of Present Illness: Patient is unable to give history or review of systems.  History is given by her daughter Lavana Webb [phone] and grand-daughter Kristi Webb [phone].    Patient presented to the ED today with family after seeing her PCP Kristi Webb Monday prior to admission.  The daughter reports that she just obtained guardianship of her mother on Saturday prior to admission after going to court.  She reports her mother had a roommate she stayed with for 10 years named Kristi Webb in [employer], [place].  For the past two years he has isolated the family from the patient.  She reports she went to court recently and found out her mother had surgery on her hip and brain within the last two years and has fallen within the last two years as well.  Daughter reports her mother has previously stayed in [facility] facility and was provided care by Kristi Webb.   Monday prior to admission she took her mother to her PCP who diagnosed her with dehydration, thrush, tachycardia.  She was given Diflucan 150 mg x 1 and Nystatin oral solution.  She has a PMH of hypothyroidism and dementia and depression. Dementia has been worsening over 1 year. Of note as well her mother has a wound on her buttocks.  SLP came to the house yesterday and noticed increased sleepiness and decreased responsiveness so recommended that the patient come to the hospital.  The  patient is nonambulatory.  Two days prior to admission the daughter reports she would echo words that she heard her saying like "love you" but not much other communication.     She reports her mother has had a decline in health x 2 years, especially decline memory loss.  The patient does not know who her daughter is by face or name.  She reports her mother has had weight loss, decreased oral intake 2 days prior to admission.   SH: Patient has 4 kids; 1 of which is deceased from suicide.  Patient is former smoker, her daughter denies that she drinks or uses illicit drugs.  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   Meds: Medications Prior to Admission  Medication Sig Dispense Refill  . acetaminophen (TYLENOL) 500 MG tablet Take 1,000 mg by mouth every 6 (six) hours as needed. For pain      . donepezil (ARICEPT) 10 MG tablet Take 10 mg by mouth daily.      . fluconazole (DIFLUCAN) 150 MG tablet Take 150 mg by mouth once.      Marland Kitchen levothyroxine (SYNTHROID, LEVOTHROID) 25 MCG tablet Take 25 mcg by mouth daily.      . mirtazapine (REMERON) 15 MG tablet Take 15 mg by mouth at bedtime as needed. For depression      . nystatin (MYCOSTATIN) 100000 UNIT/ML suspension Take 500,000 Units by mouth 4 (four) times daily.      . potassium chloride (K-DUR) 10 MEQ tablet Take  10 mEq by mouth 2 (two) times daily.      Potassium chloride 05/26/12 Remeron 15 mg 05/25/12 prn Levothyroxine 25 mcg   Allergies: Allergies as of 05/28/2012  . (No Known Allergies)   Past Medical History  Diagnosis Date  . Dementia     worse since 2012  . Thrush   . Thyroid disease   . FTT (failure to thrive) in adult    Past Surgical History  Procedure Date  . Other surgical history 04/21/2011    left hip  . Other surgical history ~2 yrs ago    brain surgery   Family History  Problem Relation Age of Onset  . Hypertension      daughter   . Suicidality      daughter 2   History    Social History  . Marital Status: Divorced    Spouse Name: N/A    Number of Children: 3  . Years of Education: HS grad   Occupational History  . 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 Smoking status: Former Smoker    Types: Cigarettes  . Smokeless tobacco: Not on file  . Alcohol Use: Not on file  . Drug Use: Not on file  . Sexually Active: Not on file   Other Topics Concern  . Not on file   Social History Narrative   Kristi Webb' daughter Kristi Webb recently took over custody of her mother in court. As of last Saturday (05/24/12) Kristi Webb is living with this daughter. The patient was previously living with a female roommate for 10-12 years who did not allow the family to see the patient for the last 2 years.       Review of Systems (per daughter and most unable to obtain because the patient has been nonverbal): General: +weight loss x 1 year, denies fever, denies sweating,decreased po intake since Saturday prior to admission  Daughter reports that patient has not been complaining much.   Physical Exam: Blood pressure 108/70, pulse 105, temperature 97.9 F (36.6 C), temperature source Oral, resp. rate 24, height 5\' 9"  (1.753 m), weight 117 lb 8.1 oz (53.3 kg), SpO2 96.00%. Vitals reviewed. HR 117; 98% on RA, RR 19, BP 125/85 General: resting in bed, nonverbal, eye close, intermittently tugging at pulse ox cord HEENT: PERRL, no scleral icterus, neck turned toward the right, dry oral mucosa with metal hardware in bottom gum and top dentures  Cardiac: tachycardic, no murmers Pulm: appear ctab, pt not able to participate in cooperation with physical exam Abd: soft, nondistended, BS present (normal), unable to assess if ttp pt is nonverbal and does not respond as if painful Ext: warm and well perfused, no pedal edema Neuro: eyes closed, nonverbal, responds to pain GCS 6 Skin: stage 2 sacral decub ulcer, ankle with erythema possible stage 1   Lab  results: Basic Metabolic Panel:  Basename 05/28/12 1104  NA 174*  K 4.2  CL 134*  CO2 24  GLUCOSE 133*  BUN 44*  CREATININE 0.93  CALCIUM 9.9  MG --  PHOS --   CBC:  Basename 05/28/12 1104  WBC 12.6*  NEUTROABS 9.3*  HGB 16.4*  HCT 52.9*  MCV 86.9  PLT 206   Urinalysis:  Basename 05/28/12 1231  COLORURINE YELLOW  LABSPEC 1.025  PHURINE 7.0  GLUCOSEU NEGATIVE  HGBUR NEGATIVE  BILIRUBINUR SMALL*  KETONESUR 15*  PROTEINUR 30*  UROBILINOGEN 1.0  NITRITE NEGATIVE  LEUKOCYTESUR LARGE*  Misc. Labs: Pending acetaminophen  Imaging results:  Dg Chest Portable 1 View  05/28/2012  *RADIOLOGY REPORT*  Clinical Data: Fatigue  PORTABLE CHEST - 1 VIEW  Comparison: 04/26/2011  Findings: Lungs are essentially clear.  No pleural effusion or pneumothorax.  The heart is normal in size.  Right suprahilar / paratracheal prominence.  While some of this may reflect patient rotation, the appearance remains worrisome for underlying adenopathy.  IMPRESSION: Right suprahilar / paratracheal prominence, worrisome for underlying adenopathy.  Consider CT chest with contrast for further evaluation.   Original Report Authenticated By: Charline Bills, M.D.     Other results: EKG resulted   Assessment & Plan by Problem: 78 y.o female PMH dementia, polyclonal gammopathy, hypothyroidism, thrush, depression, decubitus ulcer prior to admission presents for altered mental status on 8/28 found to have hypernatremia, leukocytosis, elevated anion gap with respiratory alkalosis, tachycardia, sacral decubitus stage 2 ulcer, stage 1 to ankle, FTT, 3/4 SIRS criteria on admission.    1. Altered mental status -appears to be worse than baseline dementia prob 2/2 hyerperNa but will work up for infection -GCS 6 -q4 neuro checks -pt is nonverbal, responds to pain, will not follow commands -consider CT head -pending urine culture, blood culture, tsh, acetaminophen level   2. Acute hypernatremia -bmet  q4 -free water deficit is 5.8 L -pt is very dehydrated prob 2/2 decreased po intake -started IVF at 130 cc/hr  3. Elevated anion gap with respiratory alkalosis -ABG 7.439/33.7/76.0/22.9 -pending lactic acid, acetaminophen level, w/u for infection   4. Leukocytosis -pending pan cultures   5. Tachycardia -monitor VS -prob 2/2 dehydration but pt getting IVF   6. Sacral decubitus ulcer, stage II, ankle with stage 1  -pending wound nurse consult   7. Dementia -hold Aricept po 2/2 AMS  8. Thyroid disorder -pending TSH -hold home oral Synthyroid 25 mcg   9.  Thrush -pt had Dilflucan and Nystatin solution prior to admission -oral care  10.  FTT (failure to thrive) in adult -associated with wt loss, decreased po -nutrition consult -pending liver function tests  11. DVT Px -scds  12. F/E/N -D5W/NS 0.45 % at 130 cc/hr -bmet q 4 -npo pending swallow eval  13. Dispo -will admit to step down -pt will need social work for home care services. She currently lives with her daughter x 2 days and her daughter wants her to change agencies from [facility] to [facility] -consulted spiritual, social work to review HCPOA    Signed: Annett Gula [phone] 05/28/2012, 6:07 PM

## 2012-05-28 NOTE — H&P (Signed)
Agree with details outlined here but also see my H&P

## 2012-05-28 NOTE — ED Provider Notes (Signed)
I saw and evaluated the patient, reviewed the resident's note and I agree with the findings and plan. She has dementia.  Daughter recently brought her to her house for care.  Daughter says behavior changed.  She had cough.  Concern she may have aspirated.  No hx of vomiting or diarrhea.  On exam, no distress, tachycardia, mild hypoxia.   No abd ttp or distention.  Will do cxr and labs for evaluation.    Cheri Guppy, MD 05/28/12 1155

## 2012-05-28 NOTE — Progress Notes (Signed)
Received Critical lab of Na 169 and CL greater than 130.  Almost the same as previous lab.  Md already aware.  Will continue to monitor. Kristi Webb

## 2012-05-28 NOTE — H&P (Signed)
[facility] Student Hospital Admission Note Date: 05/28/2012  Patient name: Kristi Webb Medical record number: [id number] Date of birth: [birth year] Age: 78 y.o. Gender: female PCP: [name], [facility]  Medical Service: Internal Medicine Teaching Service - [facility]  Attending physician:  Margarito Liner, MD     Chief Complaint: Altered mental status and lethargy  History of Present Illness: Kristi Webb is a 78yo female with PMH of dementia who presents to the ED with acutely altered mental status and lethargy. Per daughter's report, she was seen by her PCP two days ago for re-establishment of care of dementia and hypothyroidism. Over the last two days, Kristi Webb' mental status has deteriorated, her PO intake has decreased, and she now presents with a GCS of 6 - she withdraws from pain but does not open her eyes and does not respond verbally. Her daughter [name] recently took over custody of her mother in court and as of Saturday (05/24/12) the patient is living with this daughter, who notes significant weight loss and increasing confusion compared to 2 years ago. The patient was previously living with a female roommate who did not allow the family to see the patient for the last 2 years. Oral thrush and a decubitus ulcer of ankle were also noted by the PCP two days ago and are present today, although the thrush has improved significantly with one dose of fluconazole last night. A sacral ulcer is also noted today. The daughter states that Kristi Webb' neck has been somewhat twisted to the right for two days as well.   Meds: Current Outpatient Rx  Name Route Sig Dispense Refill  . ACETAMINOPHEN 500 MG PO TABS Oral Take 1,000 mg by mouth every 6 (six) hours as needed. For pain    . DONEPEZIL HCL 10 MG PO TABS Oral Take 10 mg by mouth daily.    Marland Kitchen FLUCONAZOLE 150 MG PO TABS Oral Take 150 mg by mouth once.    Marland Kitchen LEVOTHYROXINE SODIUM 25 MCG PO TABS Oral Take 25 mcg by mouth daily.    Marland Kitchen  MIRTAZAPINE 15 MG PO TABS Oral Take 15 mg by mouth at bedtime as needed. For depression    . NYSTATIN 100000 UNIT/ML MT SUSP Oral Take 500,000 Units by mouth 4 (four) times daily.    Marland Kitchen POTASSIUM CHLORIDE ER 10 MEQ PO TBCR Oral Take 10 mEq by mouth 2 (two) times daily.      Allergies: Allergies as of 05/28/2012  . (Not on File)   Past Medical History  Diagnosis Date  . Dementia   . Thrush   . Hypothyroidism    Surgical History -"Brain surgery" per daughter report (within last 2 years) -Left hip surgery 04/21/2011 - left open reduction and internal fixation of intertrochanteric and femoral neck fractures  Family History -Daughter: HTN -Daughter: Suicide  History   Social History  . Marital Status: Divorced    Spouse Name: N/A    Number of Children: 4 (3 living)  . Years of Education: High school graduate   Occupational History  . Retired, former English as a second language teacher [employer]   Social History Main Topics  . Smoking status: Past smoker, 30-95yrs per daughter's estimate  . Smokeless tobacco: Not on file  . Alcohol Use:   . Drug Use:   . Sexually Active:    Other Topics Concern  . Not on file   Social History Narrative  . Her daughter [name] recently took over custody of her mother in court  and as of Saturday (05/24/12) the patient is living with this daughter. The patient was previously living with a female roommate for 10-12 years who did not allow the family to see the patient for the last 2 years.     Review of Systems: Constitutional: positive for weight loss and lethargy Eyes: negative Ears, nose, mouth, throat, and face: positive for oral thrush being treated by PCP, decreased PO intake  Respiratory: negative Cardiovascular: negative Gastrointestinal: negative Genitourinary: negative Integument/breast: negative Hematologic/lymphatic: negative Musculoskeletal:positive for neck twisted to right for 2 days Neurological: positive for memory problems and  weakness Behavioral/Psych: positive for depression and fatigue Allergic/Immunologic: negative  Physical Exam: Blood pressure 110/80, pulse 112, temperature 99.4 F (37.4 C), temperature source Oral, resp. rate 23, SpO2 96.00%.  Vitals reviewed. General: resting in ED bed, lethargic HEENT: PERRL, no scleral icterus Cardiac: tacchycardic, no rubs, murmurs or gallops Pulm: clear to auscultation bilaterally, no wheezes, rales, or rhonchi Abd: soft, nontender, nondistended Ext: decubitus ulcer of ankle, stage 2 GU - sacral ulcer, stage 2 Neuro: lethargic, GCS 6, withdraws from pain but does not open eyes, does not respond verbally  Lab results: Basic Metabolic Panel:  Basename 05/28/12 1104  NA 174*  K 4.2  CL 134*  CO2 24  GLUCOSE 133*  BUN 44*  CREATININE 0.93  CALCIUM 9.9  MG --  PHOS --     Basename 05/28/12 1104  WBC 12.6*  NEUTROABS 9.3*  HGB 16.4*  HCT 52.9*  MCV 86.9  PLT 206   Urinalysis:  Basename 05/28/12 1231  COLORURINE YELLOW  LABSPEC 1.025  PHURINE 7.0  GLUCOSEU NEGATIVE  HGBUR NEGATIVE  BILIRUBINUR SMALL*  KETONESUR 15*  PROTEINUR 30*  UROBILINOGEN 1.0  NITRITE NEGATIVE  LEUKOCYTESUR LARGE*    Imaging results:  Dg Chest Portable 1 View  05/28/2012  *RADIOLOGY REPORT*  Clinical Data: Fatigue  PORTABLE CHEST - 1 VIEW  Comparison: 04/26/2011  Findings: Lungs are essentially clear.  No pleural effusion or pneumothorax.  The heart is normal in size.  Right suprahilar / paratracheal prominence.  While some of this may reflect patient rotation, the appearance remains worrisome for underlying adenopathy.  IMPRESSION: Right suprahilar / paratracheal prominence, worrisome for underlying adenopathy.  Consider CT chest with contrast for further evaluation.   Original Report Authenticated By: Charline Bills, M.D.     Other results: EKG: normal EKG, normal sinus rhythm, unchanged from previous tracings.  Assessment & Plan by Problem: Kristi Webb is a  78yo female with dementia who presents with acutely altered mental status and lethargy. She has significant hypernatremia at Na 174. She also has decubitus ulcers of the ankle and sacrum, stage II, as well as oral thrush and weight loss. She has PMH of dementia, depression, and hypothyroidism.   Differential: dehydration, delirium on dementia, depression, failure to thrive, elder abuse  1. Acutely Altered Mental Status and Lethargy -Differential: hypernatremia, dehydration, delirium on dementia, depression, elder abuse -Will continue to monitor as correct hypernatremia -Home meds - Donepezil 10mg  for memory (daily), Mirtazapine 15mg  as needed for depression (daughter not giving)  2. Hypernatremia -Labs @ 11:00am 05/28/12 emergency dept: Na 174, Anion gap 16, Free water deficit ~5L -Start D5 1/2 NS @ 130 + K 20 -Recheck Na q3h -Reassess volume status and infusion rate daily  3. Sacral Decubitus Ulcer, stage II -Keep clean and dry, avoid pressure to area as possible, frequent turns  4. Ankle Decubitus Ulcer, stage II -Tegaderm to area per PCP, keep clean and dry,  avoid pressure to area, frequent turns  5. Oral Thrush -Nystatin started 05/26/12 per PCP -Fluconazole 150mg  1st dose 05/27/12, next dose 06/03/12 per PCP -Appears significantly improved today after 1st dose fluconazole last night  6. Weight Loss -Significant weight loss per daughter's report, 1-40yrs -Will evaluate PO intake once swallow study results -Possible nutrition c/s, calorie counts  7. Hypothyroidism -Levothyroxine per PCP, last renewed 05/19/12  8. Diet -NPO now -Needs swallow study before advance diet  Dispo -Admit to stepdown unit    This is a Psychologist, occupational Note.  The care of the patient was discussed with Dr. Shirlee Latch and the assessment and plan was formulated with their assistance.  Please see their note for official documentation of the patient encounter.   Signed: Lashika Erker E 05/28/2012, 3:35 PM

## 2012-05-28 NOTE — ED Notes (Signed)
Per EMS: pt from home c/o increased lethargy and poor PO intake; pt withdrawal to pain only; baseline pt min responds to family; pt recently treated for thrush and has not eaten in 2 days per family; per family no change in urine output

## 2012-05-28 NOTE — ED Provider Notes (Signed)
History     CSN: [id number]  Arrival date & time 05/28/12  4098   First MD Initiated Contact with Patient 05/28/12 1016      Chief Complaint  Patient presents with  . Fatigue    HPI 78 yo female with dementia who presents with 2 days of altered state. Patient lives with her daughter at home. Her daughter received gardianship of her since Saturday. Per her daughter, the nursing staff that took care of her last week found her to be very different today, not talking and not sitting in her chair per normal. Daughter reports that she has been complaining of more pain in her body and has been favoring the right side of her neck. She also reports significant decreased oral intake. She denies any obvious fevers or chills. No diarrhea. Daughter reports foul smelling urine. She also has a sacral decubitus ulcer that has been monitored by the nursing staff that comes to the home.  No diarrhea, no constipation.   Code Status: daughter unsure at this point.  Past Medical History  Diagnosis Date  . Dementia   . Thrush   . Thyroid disease     History reviewed. No pertinent past surgical history.  History reviewed. No pertinent family history.  History  Substance Use Topics  . Smoking status: Not on file  . Smokeless tobacco: Not on file  . Alcohol Use:     Review of Systems  All other systems reviewed and are negative.    Allergies  Review of patient's allergies indicates not on file.  Home Medications   Current Outpatient Rx  Name Route Sig Dispense Refill  . ACETAMINOPHEN 500 MG PO TABS Oral Take 1,000 mg by mouth every 6 (six) hours as needed. For pain    . DONEPEZIL HCL 10 MG PO TABS Oral Take 10 mg by mouth daily.    Marland Kitchen FLUCONAZOLE 150 MG PO TABS Oral Take 150 mg by mouth once.    Marland Kitchen LEVOTHYROXINE SODIUM 25 MCG PO TABS Oral Take 25 mcg by mouth daily.    Marland Kitchen MIRTAZAPINE 15 MG PO TABS Oral Take 15 mg by mouth at bedtime as needed. For depression    . NYSTATIN 100000 UNIT/ML MT  SUSP Oral Take 500,000 Units by mouth 4 (four) times daily.    Marland Kitchen POTASSIUM CHLORIDE ER 10 MEQ PO TBCR Oral Take 10 mEq by mouth 2 (two) times daily.      BP 110/80  Pulse 112  Temp 99.4 F (37.4 C) (Oral)  Resp 23  SpO2 96%  Physical Exam  Constitutional:       Frail appearing and non verbal  HENT:       Dry mucous membranes  Eyes: EOM are normal. Pupils are equal, round, and reactive to light.  Neck:       Neck contracted to right side.   Cardiovascular: Normal rate and regular rhythm.        2/6 systolic murmur  Pulmonary/Chest: Effort normal and breath sounds normal.       Bibasilar crackles  Abdominal: Soft. She exhibits no distension. There is no rebound and no guarding.  Musculoskeletal: She exhibits no edema.  Skin:       Tenting of the skin is present Stage 2 decubitus ulcer on buttock  Psychiatric:       Able to follow command when asked to squeeze hands and open mouth but non verbal    ED Course  Procedures (including critical care time)  Date: 05/28/2012  Rate: 111  Rhythm: normal sinus rhythm  QRS Axis: left axis deviation  Intervals: normal  ST/T Wave abnormalities: normal  Conduction Disutrbances: none  Narrative Interpretation:   Old EKG Reviewed: 04/29/11 worsening left axis deviation  Labs Reviewed  CBC WITH DIFFERENTIAL - Abnormal; Notable for the following:    WBC 12.6 (*)     RBC 6.09 (*)     Hemoglobin 16.4 (*)     HCT 52.9 (*)     RDW 17.0 (*)     Neutro Abs 9.3 (*)     All other components within normal limits  BASIC METABOLIC PANEL - Abnormal; Notable for the following:    Sodium 174 (*)     Chloride 134 (*)     Glucose, Bld 133 (*)     BUN 44 (*)     GFR calc non Af Amer 57 (*)     GFR calc Af Amer 66 (*)     All other components within normal limits  URINALYSIS, ROUTINE W REFLEX MICROSCOPIC - Abnormal; Notable for the following:    APPearance HAZY (*)     Bilirubin Urine SMALL (*)     Ketones, ur 15 (*)     Protein, ur 30 (*)      Leukocytes, UA LARGE (*)     All other components within normal limits  URINE MICROSCOPIC-ADD ON - Abnormal; Notable for the following:    Squamous Epithelial / LPF MANY (*)     Bacteria, UA MANY (*)     Casts HYALINE CASTS (*)     Crystals TRIPLE PHOSPHATE CRYSTALS (*)     All other components within normal limits  URINE CULTURE   Dg Chest Portable 1 View  05/28/2012  *RADIOLOGY REPORT*  Clinical Data: Fatigue  PORTABLE CHEST - 1 VIEW  Comparison: 04/26/2011  Findings: Lungs are essentially clear.  No pleural effusion or pneumothorax.  The heart is normal in size.  Right suprahilar / paratracheal prominence.  While some of this may reflect patient rotation, the appearance remains worrisome for underlying adenopathy.  IMPRESSION: Right suprahilar / paratracheal prominence, worrisome for underlying adenopathy.  Consider CT chest with contrast for further evaluation.   Original Report Authenticated By: Charline Bills, M.D.    Diagnosis: Hypernatremia   MDM  Dementia patient with altered mental state which is likely from infectious source.  Check cbc, urine cx, ua, cxr, bmp.  BMP significant for hypernatremia with Na of 174. NS at 18ml/hr started. NS bolus d/ced.  Patient to be admitted to step down unit by Internal Medicine Teaching Service.        Lonia Skinner, MD 05/28/12 [phone]

## 2012-05-28 NOTE — Care Management Note (Addendum)
    Page 1 of 2   06/04/2012     8:18:56 AM   CARE MANAGEMENT NOTE 06/04/2012  Patient:  Kristi Webb, Kristi Webb   Account Number:  [id number]  Date Initiated:  05/28/2012  Documentation initiated by:  Junius Creamer  Subjective/Objective Assessment:     Action/Plan:   lives w fam, has aid   Anticipated DC Date:     Anticipated DC Plan:  HOME W HOME HEALTH SERVICES  In-house referral  Clinical Social Worker      DC Associate Professor  CM consult      [facility] Choice  HOME HEALTH   Choice offered to / List presented to:  C-4 Adult Children        HH arranged  HH-1 RN  HH-2 PT  HH-3 OT  HH-4 NURSE'S AIDE  HH-5 SPEECH THERAPY      HH agency  [facility]   Status of service:   Medicare Important Message given?   (If response is "NO", the following Medicare IM given date fields will be blank) Date Medicare IM given:   Date Additional Medicare IM given:    Discharge Disposition:  SKILLED NURSING FACILITY  Per UR Regulation:  Reviewed for med. necessity/level of care/duration of stay  If discussed at Long Length of Stay Meetings, dates discussed:    Comments:  9/4 8:17a debbie Apoorva Bugay rn,bsn met w da vickie and several granda on 9-3 w sw kristen. fam has decided on snf short term while they work on 24hr care. prob dc to snf today.  8/30 15:47 debbie Lachlan Mckim rn,bsn [phone] spoke w da/poa vickie Netherton [phone]. da states pt act w another hhc agency prior to adm but they were not pleased w that agency. copy of hhc agency list in [place] and also sistter priv duty list in room. da req hhc services to be set up w [facility]. ref to donna w [facility]. will await final orders at disch. there maybe eq needs. pt does have hosp bed. alerted da this cm off til tuesday 9/3 but cm will be be in bldg ea day.have cancelled ref w prev hhc agency and they state md office had already cancelled ref. [facility] will now follow up after disch.  8/28 17:00 debbie Charon Akamine rn,bns [phone]

## 2012-05-28 NOTE — Progress Notes (Signed)
I met with the patient's guardian, Kristi Webb, and her daughter in the patient's room this evening.  They had asked to speak with me regarding the code status for [name].  We discussed her current medical problems including hypernatremia, dementia, sacral decubitus wounds, as well as her debility.  The family states that after much consideration of what she would want that they have decided to make her DNR, DNI and that if her heart were to stop or she required intubation for life support measures they would like [place] to "help make her transition from this life to the next comfortable as possible."    We discussed that this, at this time, we would continue to treat her medical problems and support her throughout this admission and will take their wishes into account.  The order was placed for DNR/DNI.   Dietrich Ke Internal Medicine Resident Pager: [phone] 05/28/2012 7:14 PM

## 2012-05-28 NOTE — Progress Notes (Signed)
Md notified about urine lab needed will do I&O to obtain.  Vs stable will continue to monitor. Emilie [place] Liter

## 2012-05-28 NOTE — ED Notes (Signed)
NS bolus stopped patient received of the bolus. Changed rate to 116ml/hr

## 2012-05-29 ENCOUNTER — Encounter (HOSPITAL_COMMUNITY): Payer: Self-pay | Admitting: Internal Medicine

## 2012-05-29 DIAGNOSIS — E46 Unspecified protein-calorie malnutrition: Secondary | ICD-10-CM | POA: Diagnosis present

## 2012-05-29 LAB — CBC WITH DIFFERENTIAL/PLATELET
HCT: 46.4 % — ABNORMAL HIGH (ref 36.0–46.0)
Hemoglobin: 14.1 g/dL (ref 12.0–15.0)
Lymphocytes Relative: 20 % (ref 12–46)
Monocytes Absolute: 0.7 10*3/uL (ref 0.1–1.0)
Monocytes Relative: 5 % (ref 3–12)
Neutro Abs: 9.8 10*3/uL — ABNORMAL HIGH (ref 1.7–7.7)
WBC: 13.1 10*3/uL — ABNORMAL HIGH (ref 4.0–10.5)

## 2012-05-29 LAB — BASIC METABOLIC PANEL
BUN: 29 mg/dL — ABNORMAL HIGH (ref 6–23)
BUN: 24 mg/dL — ABNORMAL HIGH (ref 6–23)
BUN: 21 mg/dL (ref 6–23)
CO2: 24 mEq/L (ref 19–32)
CO2: 25 mEq/L (ref 19–32)
CO2: 25 mEq/L (ref 19–32)
Calcium: 9.4 mg/dL (ref 8.4–10.5)
Calcium: 9.2 mg/dL (ref 8.4–10.5)
Calcium: 9 mg/dL (ref 8.4–10.5)
Chloride: >130 mEq/L (ref 96–112)
Creatinine, Ser: 0.71 mg/dL (ref 0.50–1.10)
Creatinine, Ser: 0.69 mg/dL (ref 0.50–1.10)
Creatinine, Ser: 0.66 mg/dL (ref 0.50–1.10)
GFR calc Af Amer: >90 mL/min (ref >90)
GFR calc non Af Amer: 82 mL/min — ABNORMAL LOW (ref >90)
Glucose, Bld: 161 mg/dL — ABNORMAL HIGH (ref 70–99)
Glucose, Bld: 106 mg/dL — ABNORMAL HIGH (ref 70–99)
Glucose, Bld: 169 mg/dL — ABNORMAL HIGH (ref 70–99)
Sodium: 166 mEq/L (ref 135–145)

## 2012-05-29 LAB — FOLATE: Folate: 8 ng/mL

## 2012-05-29 LAB — BASIC METABOLIC PANEL WITH GFR
BUN: 31 mg/dL — ABNORMAL HIGH (ref 6–23)
CO2: 24 meq/L (ref 19–32)
Calcium: 9.3 mg/dL (ref 8.4–10.5)
Chloride: >130 meq/L (ref 96–112)
Creatinine, Ser: 0.71 mg/dL (ref 0.50–1.10)
GFR calc Af Amer: >90 mL/min
GFR calc non Af Amer: 80 mL/min — ABNORMAL LOW
Glucose, Bld: 173 mg/dL — ABNORMAL HIGH (ref 70–99)
Potassium: 3.7 meq/L (ref 3.5–5.1)
Sodium: 167 meq/L (ref 135–145)

## 2012-05-29 LAB — HEMOGLOBIN A1C
Hgb A1c MFr Bld: 5.7 % — ABNORMAL HIGH
Mean Plasma Glucose: 117 mg/dL — ABNORMAL HIGH

## 2012-05-29 LAB — OSMOLALITY, URINE: Osmolality, Ur: 961 mOsm/kg (ref 390–1090)

## 2012-05-29 LAB — IRON AND TIBC
Iron: 33 ug/dL — ABNORMAL LOW (ref 42–135)
Saturation Ratios: 22 % (ref 20–55)
TIBC: 153 ug/dL — ABNORMAL LOW (ref 250–470)
UIBC: 120 ug/dL — ABNORMAL LOW (ref 125–400)

## 2012-05-29 LAB — HEPATIC FUNCTION PANEL
ALT: 7 U/L (ref 0–35)
AST: 11 U/L (ref 0–37)
Total Protein: 7.2 g/dL (ref 6.0–8.3)

## 2012-05-29 LAB — RETICULOCYTES: RBC.: 5.29 MIL/uL — ABNORMAL HIGH (ref 3.87–5.11)

## 2012-05-29 MED ORDER — DEXTROSE 5 % IV SOLN
20.0000 mmol | Freq: Once | INTRAVENOUS | Status: AC
  Administered 2012-05-29: 20 mmol via INTRAVENOUS
  Filled 2012-05-29: qty 6.67

## 2012-05-29 MED ORDER — POTASSIUM PHOSPHATE DIBASIC 3 MMOLE/ML IV SOLN
40.0000 meq | Freq: Once | INTRAVENOUS | Status: DC
  Filled 2012-05-29: qty 9.09

## 2012-05-29 MED ORDER — SODIUM CHLORIDE 0.9 % IV BOLUS (SEPSIS)
500.0000 mL | Freq: Once | INTRAVENOUS | Status: AC
  Administered 2012-05-29: 500 mL via INTRAVENOUS

## 2012-05-29 MED ORDER — SODIUM CHLORIDE 0.9 % IJ SOLN
10.0000 mL | Freq: Two times a day (BID) | INTRAMUSCULAR | Status: DC
  Administered 2012-05-29 – 2012-06-01 (×4): 10 mL
  Administered 2012-06-02: 20 mL
  Administered 2012-06-02 – 2012-06-03 (×2): 10 mL
  Administered 2012-06-03: 20 mL
  Administered 2012-06-04: 10 mL

## 2012-05-29 MED ORDER — LEVOTHYROXINE SODIUM 100 MCG IV SOLR
12.5000 ug | Freq: Every day | INTRAVENOUS | Status: DC
  Administered 2012-05-29 – 2012-06-04 (×7): 12.5 ug via INTRAVENOUS
  Filled 2012-05-29 (×11): qty 0.63

## 2012-05-29 MED ORDER — POTASSIUM PHOSPHATE DIBASIC 3 MMOLE/ML IV SOLN: 40.0000 meq | Freq: Once | INTRAVENOUS | Status: DC

## 2012-05-29 MED ORDER — DEXTROSE-NACL 5-0.45 % IV SOLN
INTRAVENOUS | Status: DC
  Administered 2012-05-29: 150 mL/h via INTRAVENOUS
  Administered 2012-05-30 (×2): via INTRAVENOUS
  Administered 2012-05-30: 150 mL/h via INTRAVENOUS
  Administered 2012-05-31: 100 mL/h via INTRAVENOUS
  Administered 2012-05-31 – 2012-06-01 (×2): via INTRAVENOUS

## 2012-05-29 MED ORDER — DEXTROSE 5 % IV SOLN
1.0000 g | INTRAVENOUS | Status: AC
  Administered 2012-05-29 – 2012-06-03 (×6): 1 g via INTRAVENOUS
  Filled 2012-05-29 (×6): qty 10

## 2012-05-29 MED ORDER — SODIUM CHLORIDE 0.9 % IJ SOLN
10.0000 mL | INTRAMUSCULAR | Status: DC | PRN
  Administered 2012-05-31: 10 mL

## 2012-05-29 NOTE — Progress Notes (Signed)
Peripherally Inserted Central Catheter/Midline Placement  The IV Nurse has discussed with the patient and/or persons authorized to consent for the patient, the purpose of this procedure and the potential benefits and risks involved with this procedure.  The benefits include less needle sticks, lab draws from the catheter and patient may be discharged home with the catheter.  Risks include, but not limited to, infection, bleeding, blood clot (thrombus formation), and puncture of an artery; nerve damage and irregular heat beat.  Alternatives to this procedure were also discussed.  PICC/Midline Placement Documentation  PICC / Midline Double Lumen 05/29/12 PICC Left Basilic (Active)       Stacie Glaze Horton 05/29/2012, 3:53 PM

## 2012-05-29 NOTE — Progress Notes (Signed)
Nutrition Follow-up  Intervention:   1. Please monitor magnesium, potassium, and phosphorus daily for at least 3 days, MD to replete as needed, as pt is at risk for refeeding syndrome given given poor intake, underweight, weight loss and likely malnourished. 2. Recommend place Panda for temporary nutrition until able to safely take PO's 3. RD will continue to follow     Assessment:   Pt failed swallow evaluation this am.  Recommend initiation of short term EN given likely malnutrition.   Diet Order:  NPO  Meds: Scheduled Meds:   . sodium chloride   Intravenous Once  . antiseptic oral rinse  15 mL Mouth Rinse QID  . cefTRIAXone (ROCEPHIN)  IV  1 g Intravenous Q24H  . chlorhexidine  15 mL Mouth/Throat BID  . levothyroxine  12.5 mcg Intravenous Daily  . potassium phosphate IVPB (mmol)  20 mmol Intravenous Once  . sodium chloride  2,000 mL Intravenous Once  . sodium chloride  3 mL Intravenous Q12H  . DISCONTD: donepezil  10 mg Oral Daily  . DISCONTD: nystatin  500,000 Units Oral QID  . DISCONTD: potassium phosphate IVPB (mEq)  40 mEq Intravenous Once   Continuous Infusions:   . dextrose 5 % and 0.45% NaCl 1,000 mL with potassium chloride 20 mEq infusion 130 mL/hr at 05/29/12 0236   PRN Meds:.acetaminophen  Labs:  CMP     Component Value Date/Time   NA 167* 05/29/2012 0625   K 3.7 05/29/2012 0625   CL >130* 05/29/2012 0625   CO2 24 05/29/2012 0625   GLUCOSE 173* 05/29/2012 0625   BUN 31* 05/29/2012 0625   CREATININE 0.71 05/29/2012 0625   CALCIUM 9.3 05/29/2012 0625   PROT 7.2 05/29/2012 0625   ALBUMIN 3.0* 05/29/2012 0625   AST 11 05/29/2012 0625   ALT 7 05/29/2012 0625   ALKPHOS 158* 05/29/2012 0625   BILITOT 0.5 05/29/2012 0625   GFRNONAA 80* 05/29/2012 0625   GFRAA >90 05/29/2012 0625   Sodium  Date/Time Value Range Status  05/29/2012  6:25 AM 167* 135 - 145 mEq/L Final     CRITICAL RESULT CALLED TO, READ BACK BY AND VERIFIED WITH:     P STRAMOSKI,RN AT 1191 05/29/12 BY K BARR   05/28/2012 11:48 PM 166* 135 - 145 mEq/L Final     CRITICAL RESULT CALLED TO, READ BACK BY AND VERIFIED WITH:     J.COMPTON RN [phone] E.GADDY  05/28/2012  9:12 PM 169* 135 - 145 mEq/L Final     CRITICAL RESULT CALLED TO, READ BACK BY AND VERIFIED WITH:     Thayer Headings [id number] [facility]    Potassium  Date/Time Value Range Status  05/29/2012  6:25 AM 3.7  3.5 - 5.1 mEq/L Final  05/28/2012 11:48 PM 4.7  3.5 - 5.1 mEq/L Final  05/28/2012  9:12 PM 4.0  3.5 - 5.1 mEq/L Final    Phosphorus  Date/Time Value Range Status  05/29/2012  6:25 AM 2.0* 2.3 - 4.6 mg/dL Final  7/84/6962  9:52 PM 3.0  2.3 - 4.6 mg/dL Final    Magnesium  Date/Time Value Range Status  05/29/2012  6:25 AM 2.8* 1.5 - 2.5 mg/dL Final  8/41/3244  0:10 PM 3.1* 1.5 - 2.5 mg/dL Final  2/72/5366  4:40 AM 2.2  1.5 - 2.5 mg/dL Final       Intake/Output Summary (Last 24 hours) at 05/29/12 1025 Last data filed at 05/29/12 1000  Gross per 24 hour  Intake   2080 ml  Output     20 ml  Net   2060 ml    Weight Status:  121 lbs, trending up with increased fluids  Re-estimated needs:  1600-1850 kcal, 66-80 gm protein   Nutrition Dx:  Inadequate oral intake r/t AMS AEB Body mass index is 17.94 kg/(m^2).  Goal:  Diet advance vs EN initiation, currently unmet New Goal: Initiation of EN until able to pass swallow evaluation   Monitor:  EN initiation, weight, labs   Clarene Duke RD, LDN Pager [phone] After Hours pager [phone]

## 2012-05-29 NOTE — Progress Notes (Signed)
Medical Student Daily Progress Note  Subjective: 24 Hour Events : [name] was admitted to the stepdown unit last night. She is currently NPO and failed a swallow study due to LOA this AM. Nutrition saw her yesterday and recommends monitoring Mg, K, phos and repleting as needed as patient is at risk for refeeding syndrome. A discussion was had with the patient's guardian (her daughter Kristi Webb) as well as her granddaughter about code status and the decision was made to make her DNR/DNI. Overnight a urine sample was unable to be obtained due to patient's incontinence. [facility] were called this morning and are sending over her primary care records today. We might want to consider a PICC line as it is extremely difficult to draw blood peripherally per nursing. Otherwise, Kristi Webb is more responsive today and following some commands (i.e., "open mouth").   Objective: Vital signs in last 24 hours: Filed Vitals:   05/29/12 0759 05/29/12 0800 05/29/12 0900 05/29/12 1000  BP:  125/71  132/97  Pulse: 93 92 86 93  Temp: 98.7 F (37.1 C)     TempSrc: Oral     Resp:  19 18 19   Height:      Weight:      SpO2: 97% 98% 98% 99%    Intake/Output Summary (Last 24 hours) at 05/29/12 1022 Last data filed at 05/29/12 1000  IVF: D5 1/2 NS @ 130cc/hr   Gross per 24 hour  Intake   2080 ml  Output     20 ml  Net   2060 ml   Physical Exam: Vitals reviewed. General: laying on side in bed, NAD, nonverbal HEENT: Eyes - open, no scleral icterus. Mouth - top plate bolted, bottom plate missing but metal screw is there Cardiac: RRR, no rubs, murmurs or gallops Pulm: clear to auscultation bilaterally. Exam limited due to lack of following commands d/t AMS Abd: soft, nondistended, BS present. Exam limited d/t AMS but does not appear to be tender with palpation Ext: warm and well perfused, 1+ edema bilaterally. Ankle ulcer stage 1 with Mediplex bandage, clean and dry GU: Sacral ulcer stage 2 with  Mediplex bandage Neuro: nonverbal, GCS improved to 11 today, intermittently following commands (i.e., "open mouth"), eyes open spontaneously, hand grip 2/5 (right)  Lab Results: Basic Metabolic Panel:  Lab 05/29/12 1610 05/28/12 2348 05/28/12 1702  NA 167* 166* --  K 3.7 4.7 --  CL >130* >130* --  CO2 24 22 --  GLUCOSE 173* 161* --  BUN 31* 34* --  CREATININE 0.71 0.71 --  CALCIUM 9.3 9.4 --  MG 2.8* -- 3.1*  PHOS 2.0* -- 3.0   Liver Function Tests:  Lab 05/29/12 0625 05/28/12 1702  AST 11 11  ALT 7 10  ALKPHOS 158* 178*  BILITOT 0.5 0.6  PROT 7.2 8.1  ALBUMIN 3.0* 3.4*   CBC:  Lab 05/28/12 1104  WBC 12.6*  NEUTROABS 9.3*  HGB 16.4*  HCT 52.9*  MCV 86.9  PLT 206   CBG:  Lab 05/28/12 1929  GLUCAP 152*   Thyroid Function Tests:  Lab 05/28/12 1702  TSH 2.563  T4TOTAL --  FREET4 --  T3FREE --  THYROIDAB --   Coagulation:  Lab 05/28/12 1702  LABPROT 17.3*  INR 1.39   Urinalysis:  Lab 05/28/12 1231  COLORURINE YELLOW  LABSPEC 1.025  PHURINE 7.0  GLUCOSEU NEGATIVE  HGBUR NEGATIVE  BILIRUBINUR SMALL*  KETONESUR 15*  PROTEINUR 30*  UROBILINOGEN 1.0  NITRITE NEGATIVE  LEUKOCYTESUR LARGE*  Misc. Labs: Urine osmolarity pending, anemia panel pending  Micro Results: Recent Results (from the past 240 hour(s))  MRSA PCR SCREENING     Status: Normal   Collection Time   05/28/12  4:51 PM      Component Value Range Status Comment   MRSA by PCR NEGATIVE  NEGATIVE Final    Studies/Results: Dg Chest Portable 1 View  05/28/2012  *RADIOLOGY REPORT*  Clinical Data: Fatigue  PORTABLE CHEST - 1 VIEW  Comparison: 04/26/2011  Findings: Lungs are essentially clear.  No pleural effusion or pneumothorax.  The heart is normal in size.  Right suprahilar / paratracheal prominence.  While some of this may reflect patient rotation, the appearance remains worrisome for underlying adenopathy.  IMPRESSION: Right suprahilar / paratracheal prominence, worrisome for  underlying adenopathy.  Consider CT chest with contrast for further evaluation.   Original Report Authenticated By: Charline Bills, M.D.    Medications: I have reviewed the patient's current medications. Scheduled Meds:   . sodium chloride   Intravenous Once  . antiseptic oral rinse  15 mL Mouth Rinse QID  . cefTRIAXone (ROCEPHIN)  IV  1 g Intravenous Q24H  . chlorhexidine  15 mL Mouth/Throat BID  . levothyroxine  12.5 mcg Intravenous Daily  . potassium phosphate IVPB (mmol)  20 mmol Intravenous Once  . sodium chloride  2,000 mL Intravenous Once  . sodium chloride  3 mL Intravenous Q12H  . DISCONTD: donepezil  10 mg Oral Daily  . DISCONTD: nystatin  500,000 Units Oral QID  . DISCONTD: potassium phosphate IVPB (mEq)  40 mEq Intravenous Once   Continuous Infusions:   . dextrose 5 % and 0.45% NaCl 1,000 mL with potassium chloride 20 mEq infusion 130 mL/hr at 05/29/12 0236   PRN Meds:.acetaminophen  Assessment/Plan: Kristi Webb is a 78yo female with dementia who presented to the ED on 05/28/12 with acutely altered mental status. On admission she had significant hypernatremia at Na 174. She also has decubitus stage 2 sacral ulcer, stage 1 ankle ulcer, as well as oral thrush and failure to thrive. She has PMH of dementia, depression, and hypothyroidism.   1. Hypernatremia - trending down: 174--> 170--> 169--> 166-->167, so -7 over 19.5hrs from 8/28 11:00am - 8/29 6:25am -Continue IVF D5 1/2 NS @ 130cc/hr, likely severely dehydrated -Monitor with bmet q4 -Pending urine Os - possible foley -Free water deficit improved to 4.9 L this morning  2. Altered Mental Status - improved: GCS 6-->11 -Likely 2/2 hypernatremia, baseline dementia, possible UTI -GCS 11 this morning: nonverbal, intermittently follows commands, eyes spontaneously open -Consider CT head/neck, non-contrast -UA suspicious for UTI, empirically tx w/ Rocephin (ceftriaxone)  3. Sacral Decubitus Ulcer, stage 2  -Mediplex in  place; pt turned on side -Keep clean and dry, avoid pressure to area as possible, frequent turns  -Wound care nurse c/s pending  4. Ankle Decubitus Ulcer, stage 1  -Mediplex in place -Keep clean and dry, avoid pressure to area, frequent turns -Wound care nurse c/s pending   5. Oral Thrush - improved  -Nystatin started 05/26/12 per PCP  -Fluconazole 150mg  1st dose 05/27/12, next dose 06/03/12 per PCP  -Appears significantly improved after 1st dose fluconazole Tues night   6. Failure to Thrive / Diet - weight trending up: 117-->121lb -Significant weight loss per daughter's report, 1-49yrs  -BMI 17.94, weight trending up with fluids -Currently NPO, speech consult - failed swallow study -Nutrition consult: monitor Mg, K, and phos and replete as needed; patient is at risk for  refeeding syndrome -Recommend place Panda for temporary nutrition until able to safely take PO's -pending pre-albumin (albumin 3.0)  7. Hypothyroidism - prior diagnosis -Levothyroxine per PCP, last renewed 05/19/12, holding for now  -TSH 2.563 this admission  8. Pulmonary Mass - noted previously, want to f/u -Previous CXR 04/2011: 3.8 mass like consolidation in the R mid lung  -Previous CT 04/2011: concerning for infection with R upper lobe lesion  -this admission CXR 05/2012: R suprahilar/paratracheal prominence, worrisome for underlying adenopathy  -consider repeat CT chest this admission   9. DVT Px -scds  10. Dispo  -Admitted to stepdown unit -Social work c/s, spiritual c/s -Daughter wants to change agencies [facility] to [facility]  Code status: DNR/DNI   LOS: 1 day   This is a Psychologist, occupational Note.  The care of the patient was discussed with Dr. Shirlee Latch and the assessment and plan formulated with their assistance.  Please see their attached note for official documentation of the daily encounter.  Kristi Webb E 05/29/2012, 10:22 AM

## 2012-05-29 NOTE — Progress Notes (Signed)
Spoke with RN Dennie Bible states patient is not putting out much urine.  Only recorded 100 cc since this am and 75 in the bag now.  RN reports the patient is more alert and answering yes to questions.  GCS 11.   She reports PICC line was placed at 3:50 PM and the IVF started running at 4 PM.

## 2012-05-29 NOTE — Progress Notes (Signed)
MD (Dr. Manson Passey) notified of current lab results from 2000 lab draw, no new orders. Will cont to monitor.

## 2012-05-29 NOTE — Progress Notes (Signed)
Dr.  Samuel Bouche notified of positive aerobic blood culture:  Gram positive cocci in pairs and clusters.

## 2012-05-29 NOTE — Consult Note (Signed)
WOC consult Note Reason for Consult: Consult requested for sacrum and left ankle wounds. Wound type: Sacrum and buttocks with pink scar tissue from previous wound which is healing.  Unable to determine previous stage of wound.  Pt is immobile and was incontinent prior to receiving foley upon admission. Pressure Ulcer POA: Yes Measurement: Patchy areas of multiple stage 2 wounds in area 8X10cm.   All pink and dry with depth of .1cm. Left ankle with .5X.5cm dark purple deep tissue injury. Drainage (amount, consistency, odor) No odor or drainage to any sites. Dressing procedure/placement/frequency: Foam dressing to protect and promote healing.  Air overlay mattress to reduce pressure.  Optimize nutrition. Will not plan to follow further unless re-consulted.  [address], RN, MSN, [employer]  [phone]

## 2012-05-29 NOTE — Progress Notes (Signed)
ANTIBIOTIC CONSULT NOTE - INITIAL  Pharmacy Consult for Rocephin Indication:   Possible UTI  No Known Allergies  Patient Measurements: Height: 5\' 9"  (175.3 cm) Weight: 121 lb 7.6 oz (55.1 kg) IBW/kg (Calculated) : 66.2    Vital Signs: Temp: 98.7 F (37.1 C) (08/29 0759) Temp src: Oral (08/29 0759) BP: 125/71 mmHg (08/29 0800) Pulse Rate: 93  (08/29 0759) Intake/Output from previous day: 08/28 0701 - 08/29 0700 In: 1560 [I.V.:1560] Out: -  Intake/Output from this shift:    Labs:  Basename 05/29/12 0625 05/28/12 2348 05/28/12 2112 05/28/12 1104  WBC -- -- -- 12.6*  HGB -- -- -- 16.4*  PLT -- -- -- 206  LABCREA -- -- -- --  CREATININE 0.71 0.71 0.76 --   Estimated Creatinine Clearance: 50.4 ml/min (by C-G formula based on Cr of 0.71). No results found for this basename: VANCOTROUGH:2,VANCOPEAK:2,VANCORANDOM:2,GENTTROUGH:2,GENTPEAK:2,GENTRANDOM:2,TOBRATROUGH:2,TOBRAPEAK:2,TOBRARND:2,AMIKACINPEAK:2,AMIKACINTROU:2,AMIKACIN:2, in the last 72 hours   Microbiology: Recent Results (from the past 720 hour(s))  MRSA PCR SCREENING     Status: Normal   Collection Time   05/28/12  4:51 PM      Component Value Range Status Comment   MRSA by PCR NEGATIVE  NEGATIVE Final     Medical History: Past Medical History  Diagnosis Date  . Dementia     worse since 2012  . Thrush   . Thyroid disease   . FTT (failure to thrive) in adult    Assessment: 80yof with Hx dementia, admitted with MS changes, leukocytosis.  UA hazy with leukocytes possible UTI, Cxr negative for pna.  Empiric abx - rocephin for possible UTI.  Renal function stable with Cr 0.7 and crcl approx 68ml/min  Goal of Therapy:  Resolution of infection  Plan:  Rocephin 1Gm IV daily This does not need to be adjusted for renal function or other so RX will sign off. Please reconsult if further ABX needs.  Thank you for allowing [place] to help care for this patient.   Marcelino Scot 05/29/2012,8:44 AM

## 2012-05-29 NOTE — Progress Notes (Signed)
Attempted twice during night to obtain Urine sample for lab. Once another Rn, then later myself.  Each time pt had incont. Episode.  Pt unable to verbalize the need to void.  Vs stable will continue to monitor. Kristi Webb

## 2012-05-29 NOTE — Evaluation (Signed)
Clinical/Bedside Swallow Evaluation Patient Details  Name: CHRISANDRA WIEMERS MRN: [id number] Date of Birth: [birth year]  Today's Date: 05/29/2012 Time: 0810-0834 SLP Time Calculation (min): 24 min  Past Medical History:  Past Medical History  Diagnosis Date  . Dementia     worse since 2012  . Thrush   . Thyroid disease   . FTT (failure to thrive) in adult    Past Surgical History:  Past Surgical History  Procedure Date  . Other surgical history 04/21/2011    left hip  . Other surgical history ~2 yrs ago    brain surgery   HPI:  Ms. Croslin is a 78yo female with dementia who presents with acutely altered mental status and lethargy. She has significant hypernatremia at Na 174. She also has decubitus ulcers of the ankle and sacrum, stage II, as well as oral thrush and weight loss. She has PMH of dementia, depression, and hypothyroidism.    Assessment / Plan / Recommendation Clinical Impression  Pt presents with decreased LOA impairing awareness of POs. With max cues to stimulate pt and presentation of PO with contextual cues, pt with no response or awareness of PO. PO poured anteriorally from oral cavity. No swallow response or coughing elicited. SLP discussed with RN and pts daughter who verbalizes understanding of NPO status. SLP will follow closely for increased alertness. Consider short term alternate nutrition if mental status persists. SLP received order for Bedside swallow and Eval and Treat. Unclear if MD desire cognitive linguistic evaluation. Will defer at this time, if so as pt is not able to participate in evaluation due to LOA.     Aspiration Risk  Severe    Diet Recommendation NPO;Alternative means - temporary        Other  Recommendations Oral Care Recommendations: Oral care QID   Follow Up Recommendations   (TBD)    Frequency and Duration min 2x/week  2 weeks   Pertinent Vitals/Pain NA    SLP Swallow Goals Goal #3: Pt will orally manipulate transit and swallow  SLP provided trials of thin and puree consistencies with max contextual cues to determine readiness for PO intake.    Swallow Study Prior Functional Status       General HPI: Ms. Laatsch is a 78yo female with dementia who presents with acutely altered mental status and lethargy. She has significant hypernatremia at Na 174. She also has decubitus ulcers of the ankle and sacrum, stage II, as well as oral thrush and weight loss. She has PMH of dementia, depression, and hypothyroidism.  Type of Study: Bedside swallow evaluation Diet Prior to this Study: NPO Temperature Spikes Noted: No History of Recent Intubation: No Behavior/Cognition: Other (comment) (awake but unresponsive) Oral Cavity - Dentition: Dentures, top (dentures bottom not available) Self-Feeding Abilities: Refused PO Patient Positioning: Upright in bed Baseline Vocal Quality: Other (comment) (No attempt at phonation) Volitional Cough: Cognitively unable to elicit Volitional Swallow: Unable to elicit    Oral/Motor/Sensory Function Overall Oral Motor/Sensory Function: Impaired (No attempts at movement)   Ice Chips Ice chips: Impaired Presentation: Spoon Oral Phase Impairments: Poor awareness of bolus Oral Phase Functional Implications: Right anterior spillage;Oral holding Pharyngeal Phase Impairments:  (No attempt to trigger swallow)   Thin Liquid Thin Liquid: Impaired Presentation: Cup;Straw Oral Phase Impairments: Poor awareness of bolus (No attempt to manipulate bolus) Oral Phase Functional Implications: Right anterior spillage;Oral holding;Oral residue    Nectar Thick Nectar Thick Liquid: Not tested   Honey Thick Honey Thick Liquid: Not tested  Puree Puree: Not tested   Solid   GO    Solid: Not tested       Emersen Carroll, [name] 05/29/2012,8:54 AM

## 2012-05-29 NOTE — Progress Notes (Signed)
Rec'd referral to see pt.  Pt unresp.  Spoke with nurse who asked for chapl to return whn family is present.

## 2012-05-29 NOTE — Progress Notes (Signed)
Critical value of Na 160 and CL greater than 130 received from lab. Na slightly improving.  Md already aware of abn values from previous lab.  Vs stable will continue to monitor. Emilie [place] Liter

## 2012-05-29 NOTE — H&P (Signed)
Internal Medicine Attending Admission Note Date: 05/29/2012  Patient name: Kristi Webb Medical record number: [id number] Date of birth: [birth year] Age: 78 y.o. Gender: female  I saw and evaluated the patient. I reviewed the resident's note and I agree with the resident's findings and plan as documented in the resident's note, with the following additional comments.  Chief Complaint(s): Altered mental status  History - key components related to admission: Patient is a 78 year old woman with history of dementia, normocytic anemia, left hip fracture status post open reduction and internal fixation in July of 2012, right upper lobe lesion by chest x-ray and chest CT in July 2012, and other problems as outlined in medical history admitted with altered mental status and decreased responsiveness.   Physical Exam - key components related to admission:  Filed Vitals:   05/29/12 0759 05/29/12 0800 05/29/12 0900 05/29/12 1000  BP:  125/71  132/97  Pulse: 93 92 86 93  Temp: 98.7 F (37.1 C)     TempSrc: Oral     Resp:  19 18 19   Height:      Weight:      SpO2: 97% 98% 98% 99%   General: Alert, opens eyes; nonverbal; does not follow commands Lungs: Clear Heart: Regular; S1-S2, no S3, no S4, no murmurs Abdomen: Bowel sounds present, soft, nontender Extremities: No edema  Lab results:   Basename 05/29/12 0625 05/28/12 2348 05/28/12 2112 05/28/12 1702 05/28/12 1104  NA 167* 166* 169* 170* 174*  K 3.7 4.7 4.0 4.0 4.2  CL >130* >130* >130* >130* 134*  CO2 24 22 24 24 24   BUN 31* 34* 36* 39* 44*  CREATININE 0.71 0.71 0.76 0.77 0.93  GLUCOSE 173* 161* 174* 123* 133*  CALCIUM 9.3 9.4 9.4 9.7 9.9  MG 2.8* -- -- 3.1* --  PHOS 2.0* -- -- 3.0 --     Liver Function Tests:  Basename 05/29/12 0625 05/28/12 1702  AST 11 11  ALT 7 10  ALKPHOS 158* 178*  BILITOT 0.5 0.6  PROT 7.2 8.1  ALBUMIN 3.0* 3.4*    CBC:  Basename 05/28/12 1104  WBC 12.6*  NEUTROABS 9.3*  HGB 16.4*  HCT  52.9*  MCV 86.9  PLT 206    CBG:  Basename 05/28/12 1929  GLUCAP 152*   Hemoglobin A1C:  Basename 05/28/12 1702  HGBA1C 5.7*    Thyroid Function Tests:  Basename 05/28/12 1702  TSH 2.563  T4TOTAL --  FREET4 --  T3FREE --  THYROIDAB --    Coagulation:  Basename 05/28/12 1702  INR 1.39   Urinalysis    Component Value Date/Time   COLORURINE YELLOW 05/28/2012 1231   APPEARANCEUR HAZY* 05/28/2012 1231   LABSPEC 1.025 05/28/2012 1231   PHURINE 7.0 05/28/2012 1231   GLUCOSEU NEGATIVE 05/28/2012 1231   HGBUR NEGATIVE 05/28/2012 1231   BILIRUBINUR SMALL* 05/28/2012 1231   KETONESUR 15* 05/28/2012 1231   PROTEINUR 30* 05/28/2012 1231   UROBILINOGEN 1.0 05/28/2012 1231   NITRITE NEGATIVE 05/28/2012 1231   LEUKOCYTESUR LARGE* 05/28/2012 1231    Urine microscopic: WBCs 21-50, RBCs 0-2, many squamous epithelial, many bacteria, triple phosphate crystals, hyaline casts    Imaging results:  Dg Chest Portable 1 View  05/28/2012  *RADIOLOGY REPORT*  Clinical Data: Fatigue  PORTABLE CHEST - 1 VIEW  Comparison: 04/26/2011  Findings: Lungs are essentially clear.  No pleural effusion or pneumothorax.  The heart is normal in size.  Right suprahilar / paratracheal prominence.  While some of this may reflect  patient rotation, the appearance remains worrisome for underlying adenopathy.  IMPRESSION: Right suprahilar / paratracheal prominence, worrisome for underlying adenopathy.  Consider CT chest with contrast for further evaluation.   Original Report Authenticated By: Charline Bills, M.D.     Other results: EKG:  normal sinus rhythm, left axis deviation, borderline repolarization abnormality  Assessment & Plan by Problem:  1.  Hypernatremia.  Chronicity is unclear; likely due to dehydration secondary to poor oral intake.  Plan is gradual correction at a rate of no more than 10 mEq/L in a 24 hour period; given concurrent volume deficit, we are using D5 half-normal saline; can switch to D5W  when patient is euvolemic.  Would place a Foley catheter to monitor urine output given patient's incontinence.  Would also check a urine osmolality.  2.  Altered mental status.  Patient's baseline mental status is unclear; would discuss with family to further clarify.  Acute worsening is likely due to hypernatremia, with possible contribution from urinary tract infection.  There also appears to be a component of chronic dementia.  3.  UTI.  Plan is empiric ceftriaxone pending urine culture result.  4.  Other problems as per resident physician.

## 2012-05-29 NOTE — Progress Notes (Signed)
Subjective: Unable to obtain as patient is nonverbal   Objective: Vital signs in last 24 hours: Filed Vitals:   05/29/12 0759 05/29/12 0800 05/29/12 0900 05/29/12 1000  BP:  125/71  132/97  Pulse: 93 92 86 93  Temp: 98.7 F (37.1 C)     TempSrc: Oral     Resp:  19 18 19   Height:      Weight:      SpO2: 97% 98% 98% 99%   Weight change:   Intake/Output Summary (Last 24 hours) at 05/29/12 1014 Last data filed at 05/29/12 1000  Gross per 24 hour  Intake   2080 ml  Output     20 ml  Net   2060 ml   Vitals reviewed. General: resting in bed, NAD, nonverbal, HEENT: spontaneously opening eyes, no scleral icterus Cardiac: slightly tachycardic, no rubs, murmurs or gallops Pulm: clear to auscultation bilaterally though not able to assess adequately due to lack of cooperation d/t AMS Abd: soft, nondistended, BS present (normal); not responding with palpation as if abdomen is tender Ext: warm and well perfused, 1+ edema b/l, stage 1 to ankle with Mediplex bandage Neuro: nonverbal, will open eyes to verbal command, will obey verbal command but hand grip is 2/5 (right)  Lab Results: Basic Metabolic Panel:  Lab 05/29/12 9562 05/28/12 2348 05/28/12 1702  NA 167* 166* --  K 3.7 4.7 --  CL >130* >130* --  CO2 24 22 --  GLUCOSE 173* 161* --  BUN 31* 34* --  CREATININE 0.71 0.71 --  CALCIUM 9.3 9.4 --  MG 2.8* -- 3.1*  PHOS 2.0* -- 3.0   Liver Function Tests:  Lab 05/29/12 0625 05/28/12 1702  AST 11 11  ALT 7 10  ALKPHOS 158* 178*  BILITOT 0.5 0.6  PROT 7.2 8.1  ALBUMIN 3.0* 3.4*   CBC:  Lab 05/28/12 1104  WBC 12.6*  NEUTROABS 9.3*  HGB 16.4*  HCT 52.9*  MCV 86.9  PLT 206   CBG:  Lab 05/28/12 1929  GLUCAP 152*   Thyroid Function Tests:  Lab 05/28/12 1702  TSH 2.563  T4TOTAL --  FREET4 --  T3FREE --  THYROIDAB --   Coagulation:  Lab 05/28/12 1702  LABPROT 17.3*  INR 1.39   Urinalysis:  Lab 05/28/12 1231  COLORURINE YELLOW  LABSPEC 1.025  PHURINE  7.0  GLUCOSEU NEGATIVE  HGBUR NEGATIVE  BILIRUBINUR SMALL*  KETONESUR 15*  PROTEINUR 30*  UROBILINOGEN 1.0  NITRITE NEGATIVE  LEUKOCYTESUR LARGE*   Misc. Labs: Urine Os pending, anemia panel  Micro Results: Recent Results (from the past 240 hour(s))  MRSA PCR SCREENING     Status: Normal   Collection Time   05/28/12  4:51 PM      Component Value Range Status Comment   MRSA by PCR NEGATIVE  NEGATIVE Final    Studies/Results: Dg Chest Portable 1 View  05/28/2012  *RADIOLOGY REPORT*  Clinical Data: Fatigue  PORTABLE CHEST - 1 VIEW  Comparison: 04/26/2011  Findings: Lungs are essentially clear.  No pleural effusion or pneumothorax.  The heart is normal in size.  Right suprahilar / paratracheal prominence.  While some of this may reflect patient rotation, the appearance remains worrisome for underlying adenopathy.  IMPRESSION: Right suprahilar / paratracheal prominence, worrisome for underlying adenopathy.  Consider CT chest with contrast for further evaluation.   Original Report Authenticated By: Charline Bills, M.D.    Medications:  Scheduled Meds:    . sodium chloride   Intravenous Once  .  antiseptic oral rinse  15 mL Mouth Rinse QID  . cefTRIAXone (ROCEPHIN)  IV  1 g Intravenous Q24H  . chlorhexidine  15 mL Mouth/Throat BID  . levothyroxine  12.5 mcg Intravenous Daily  . potassium phosphate IVPB (mmol)  20 mmol Intravenous Once  . sodium chloride  2,000 mL Intravenous Once  . sodium chloride  3 mL Intravenous Q12H  . DISCONTD: donepezil  10 mg Oral Daily  . DISCONTD: nystatin  500,000 Units Oral QID  . DISCONTD: potassium phosphate IVPB (mEq)  40 mEq Intravenous Once   Continuous Infusions:    . dextrose 5 % and 0.45% NaCl 1,000 mL with potassium chloride 20 mEq infusion 130 mL/hr at 05/29/12 0236   PRN Meds:.acetaminophen Assessment/Plan: 78 y.o female PMH dementia, polyclonal gammopathy, hypothyroidism, thrush, depression, decubitus ulcer prior to admission presents  for altered mental status on 8/28 found to have hypernatremia, leukocytosis, elevated anion gap (16) with respiratory alkalosis, tachycardia, sacral decubitus stage 2 ulcer, stage 1 to ankle, FTT, 3/4 SIRS criteria on admission 8/28.    1. Altered mental status  -multifactorial prob 2/2 baseline dementia, hyerperNa, but will work up for infection  -GCS 10 this morning;pt is nonverbal, intermittently follows commands, opens eyes to verbal command  -q4 neuro checks  -consider CT head/neck w/o contrast when patient is able to tolerate  -pending urine culture, blood culture, UA suspcious for UTI -tsh2.563, acetaminophen level <15.0, lactic acid 1.9 -Empirically tx'ing possibly UTI with Rocephin  2. Acute hypernatremia  -bmet q4  -free water deficit was 5.8 L on admission, FWD this am is 4.9 L -pt is very dehydrated prob 2/2 decreased po intake which potentially could be related to worsening dementia contributing decreased want for po intake which could be leading to hypernatremia -started IVF at 130 cc/hr  -pending urine Os  3. Elevated anion gap with respiratory alkalosis  -ABG 7.439/33.7/76.0/22.9  -lactic acid 1.9, acetaminophen level <15.0 -w/u for infection and treating for dehydration  4. Leukocytosis  -pt remains afebrile, still tachycardic, slightly tachypneic -pending CBC this am -pending pan cultures   5. Tachycardia  -monitor VS  -prob 2/2 dehydration but pt getting IVF   6. Sacral decubitus ulcer, stage II, ankle with stage 1  -pending wound nurse consult  -RN to continue wound care  7. Dementia  -holding Aricept b/c pt is NPO  8. Thyroid disorder  -h/o subclinical hypothyroidism  -TSH was 2.563 this admission -holding home oral Synthyroid 25 mcg   9. Thrush  -continue oral care   10. FTT (failure to thrive) in adult  -currently NPO -associated with wt loss, decreased po  -nutrition consulted -pending preAlbumin, Albumin was low 3.0    11.  Pulmonary -previous CXR 04/2011 showed 3.8 mass like consolidation in the R mid lung -previous CT 04/2011 concerning for infection with right upper lobe lesion -CXR this admission with right suprahilar/paratracheal prominence, worrisome for underlying adenopathy -Consider repeat CT chest this admission.   12. DVT Px  -scds   13. F/E/N  -D5W/NS 0.45 % at 130 cc/hr; IV access currently consider PICC b/c pt is hard stick -bmet q 4 (replaced phosphorus today with Kphos 20 mmol iv) -npo currently failed swallow evaluation   14. Dispo   -Consulted social work for home care services. Her daughter wants her to change agencies from [place] to [facility]); consulted also to fill out HCPOA forms with daughter Larene Beach -consulted spiritual   Code Status: DNR     LOS: 1 day  Desma Maxim N 05/29/2012, 10:14 AM

## 2012-05-30 ENCOUNTER — Inpatient Hospital Stay (HOSPITAL_COMMUNITY): Payer: Medicare Other

## 2012-05-30 ENCOUNTER — Encounter (HOSPITAL_COMMUNITY): Payer: Self-pay | Admitting: Internal Medicine

## 2012-05-30 DIAGNOSIS — E870 Hyperosmolality and hypernatremia: Secondary | ICD-10-CM

## 2012-05-30 DIAGNOSIS — R4182 Altered mental status, unspecified: Secondary | ICD-10-CM

## 2012-05-30 LAB — BASIC METABOLIC PANEL
BUN: 17 mg/dL (ref 6–23)
BUN: 14 mg/dL (ref 6–23)
CO2: 24 mEq/L (ref 19–32)
CO2: 20 mEq/L (ref 19–32)
Calcium: 8.6 mg/dL (ref 8.4–10.5)
Calcium: 7.6 mg/dL — ABNORMAL LOW (ref 8.4–10.5)
Calcium: 8.6 mg/dL (ref 8.4–10.5)
Chloride: >130 mEq/L (ref 96–112)
Chloride: 125 mEq/L — ABNORMAL HIGH (ref 96–112)
Creatinine, Ser: 0.54 mg/dL (ref 0.50–1.10)
Creatinine, Ser: 0.49 mg/dL — ABNORMAL LOW (ref 0.50–1.10)
Creatinine, Ser: 0.5 mg/dL (ref 0.50–1.10)
GFR calc Af Amer: >90 mL/min (ref >90)
GFR calc Af Amer: >90 mL/min (ref >90)
GFR calc Af Amer: >90 mL/min (ref >90)
GFR calc Af Amer: >90 mL/min (ref >90)
GFR calc non Af Amer: 87 mL/min — ABNORMAL LOW (ref >90)
GFR calc non Af Amer: 88 mL/min — ABNORMAL LOW (ref >90)
GFR calc non Af Amer: >90 mL/min (ref >90)
GFR calc non Af Amer: 89 mL/min — ABNORMAL LOW (ref >90)
GFR calc non Af Amer: >90 mL/min (ref >90)
Glucose, Bld: 130 mg/dL — ABNORMAL HIGH (ref 70–99)
Glucose, Bld: 108 mg/dL — ABNORMAL HIGH (ref 70–99)
Potassium: 3 mEq/L — ABNORMAL LOW (ref 3.5–5.1)
Sodium: 151 mEq/L — ABNORMAL HIGH (ref 135–145)
Sodium: 155 mEq/L — ABNORMAL HIGH (ref 135–145)

## 2012-05-30 LAB — HEPATIC FUNCTION PANEL
ALT: 8 U/L (ref 0–35)
AST: 15 U/L (ref 0–37)
Bilirubin, Direct: 0.1 mg/dL (ref 0.0–0.3)
Indirect Bilirubin: 0.2 mg/dL — ABNORMAL LOW (ref 0.3–0.9)
Total Bilirubin: 0.3 mg/dL (ref 0.3–1.2)

## 2012-05-30 LAB — CBC WITH DIFFERENTIAL/PLATELET
Basophils Absolute: 0 10*3/uL (ref 0.0–0.1)
Basophils Relative: 0 % (ref 0–1)
Eosinophils Absolute: 0.1 10*3/uL (ref 0.0–0.7)
Hemoglobin: 11.8 g/dL — ABNORMAL LOW (ref 12.0–15.0)
MCH: 26.5 pg (ref 26.0–34.0)
MCHC: 30.5 g/dL (ref 30.0–36.0)
Monocytes Relative: 6 % (ref 3–12)
Neutro Abs: 5.8 10*3/uL (ref 1.7–7.7)
Neutrophils Relative %: 61 % (ref 43–77)
Platelets: 126 10*3/uL — ABNORMAL LOW (ref 150–400)
RDW: 16.4 % — ABNORMAL HIGH (ref 11.5–15.5)

## 2012-05-30 LAB — PHOSPHORUS: Phosphorus: 2.3 mg/dL (ref 2.3–4.6)

## 2012-05-30 LAB — MAGNESIUM: Magnesium: 2.5 mg/dL (ref 1.5–2.5)

## 2012-05-30 MED ORDER — POTASSIUM CHLORIDE 10 MEQ/50ML IV SOLN
10.0000 meq | INTRAVENOUS | Status: AC
  Administered 2012-05-31 (×3): 10 meq via INTRAVENOUS
  Filled 2012-05-30 (×2): qty 50

## 2012-05-30 MED ORDER — POTASSIUM CHLORIDE 10 MEQ/100ML IV SOLN: 10.0000 meq | INTRAVENOUS | Status: DC

## 2012-05-30 MED ORDER — POTASSIUM CHLORIDE 10 MEQ/50ML IV SOLN
INTRAVENOUS | Status: AC
  Administered 2012-05-30: 10 meq via INTRAVENOUS
  Filled 2012-05-30: qty 200

## 2012-05-30 MED ORDER — POTASSIUM CHLORIDE 10 MEQ/50ML IV SOLN
INTRAVENOUS | Status: AC
  Administered 2012-05-30: 10 meq via INTRAVENOUS
  Filled 2012-05-30: qty 100

## 2012-05-30 MED ORDER — POTASSIUM CHLORIDE 10 MEQ/50ML IV SOLN
10.0000 meq | INTRAVENOUS | Status: AC
  Administered 2012-05-30 (×4): 10 meq via INTRAVENOUS

## 2012-05-30 MED ORDER — POTASSIUM CHLORIDE 10 MEQ/50ML IV SOLN
10.0000 meq | INTRAVENOUS | Status: AC
  Administered 2012-05-30 (×4): 10 meq via INTRAVENOUS
  Filled 2012-05-30: qty 100

## 2012-05-30 NOTE — Progress Notes (Signed)
MD made rounds and d51/2ns decreased (112ml/h)as requested verbally and pt to have 4 runs of potassium ( each) for a total fluid of 127ml/h. Will cont to monitor.   Critical lab (na and cl) not called to MD as they are aware and na level trending down.

## 2012-05-30 NOTE — Progress Notes (Signed)
Clinical Social Work-CSW received referral and discussed case with MD with regards to Daughters Guardianship-CSW attempted to contact dtr and has left message-Pt AMS and not able to participate in d/c planning at this time-full assessment to follow Jodean Lima, [phone]
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
## 2012-05-30 NOTE — Progress Notes (Signed)
Medical records were obtained via fax from [facility] on 05/28/12. I've reviewed these records and note the following:  4 total encounters @ [facility] (appts with Iona Hansen, FNP-C) 1. 07/20/11 - dx dementia 2. 09/14/11 - dx dementia, constipation 3. 10/17/11 - dx dementia 4. 05/26/12 - dx dementia, hypothyroidism, tachycardia, candida infection of mouth, decubitus ulcer or ankle, decreased mental status. This was 2 days prior to current hospital admission.  Pertinent info gained from [facility] notes: 1. Mentioned admission to [facility] 2013. Dx: acute/chronic subdural hematomas (see below) 2. Mentioned [facility] for rehab 6/20-7/17/13 per [facility] nurse Johnny Bridge 3. Per H&P: dementia was dx'd in the last 1-2 years 4. Per social history: stopped smoking in last 1-2 years, wears glasses 5. Per ROS 05/26/12: General - Not wanting to eat or drink anything other then ensure or applesauce (good to know as we advance diet) MSK - not able to walk, uses wheelchair, has hospital bed (per daughter 05/26/12) 6. Ms. Olkowski was accompanied by Leonette Most (former caregiver) to earlier appts (07/20/11 & 09/14/11).The 09/14/11 note relays that "Leonette Most is tired, 'can't do this much longer,' plans to turn over to her daughter by March."  [facility]  11/29/11 ED visit. CC: Tremors, AMS. Of note, wt 74kg (183lb), BMI 27.1 (much lower now, 05/28/12). HPI: AMS progressed over 1 wk, family reported very functional prior, able to eat and dress herself. Now nonverbal and trouble swallowing, GCS 13. PMH: dementia, hypothyroidism. Labs - Na 138, K 4.8, Cl 103, glucose 102. 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 Plan: admit to neuro ICU  04/25/12 CT head without contrast - no significant interval change or acute abnormality (comparison: 02/04/12; don't have these records)  Pertinent info gained from [facility] notes: 1. Mentioned dementia  of unknown etiology since March 2012  Missing: Neuro ICU hospital course, questionable "brain surgery" per daughter's report, CT head 02/14/12, any explanation of metal plates/screw in mouth

## 2012-05-30 NOTE — Progress Notes (Signed)
Agree with medical student. See my noted from 8/29 as well

## 2012-05-30 NOTE — Progress Notes (Signed)
Went to see patient around 1:10 am.  Patient resting comfortably in bed, opens eyes when we walk in the room.  She is unable to follow commands while we are in the room, but nursing reports that she is intermittently able to open her mouth to command and squeeze the nurse's hand on command.  Potassium noted to be 3.4, and orders were placed for 4 runs of potassium IV.  Fluids adjusted accordingly, so that patient will only get 150 cc/hr total of fluids (100 cc/hr of D5,1/2NS, with 50 cc/hr of NS).  Signed, Janalyn Harder, PGY2 05/30/2012, 1:21 AM

## 2012-05-30 NOTE — Progress Notes (Signed)
Speech Language Pathology Dysphagia Treatment Patient Details Name: Kristi Webb MRN: [id number] DOB: 17-Oct-1931 Today's Date: 05/30/2012 Time: 1130-1200 SLP Time Calculation (min): 30 min  Assessment / Plan / Recommendation Clinical Impression  Pt now awake and alert. Demonstrates a moderate, cognitive based oral dysphagia. SLP was able to provide max assist for pt to feed herself. Pt held thin liquid boluses in oral cavity for several second with piecemeal deglutition, no signs of aspiraiton. With trial of puree, despite max cues pt did not transit bolus to pharynx, applesauce was suctioned from oral cavity. Recommend initiating a full liquid diet with a nutrition consult to faciliate calorie intake. SLP will continue trials of solid PO and recommend upgrade to solids when pt demonstrates functional consumption.     Diet Recommendation  Initiate / Change Diet: Thin liquid    SLP Plan Continue with current plan of care   Pertinent Vitals/Pain NA   Swallowing Goals  SLP Swallowing Goals Patient will consume recommended diet without observed clinical signs of aspiration with: Maximum assistance Goal #3: Pt will orally manipulate transit and swallow SLP provided trials of thin and puree consistencies with max contextual cues to determine readiness for PO intake.  Swallow Study Goal #3 - Progress: Met Goal #4: Pt will conume trials of pureed solids with max contextual cues without prolongued pocketing x5 during one session.   General Temperature Spikes Noted: No Respiratory Status: Room air Behavior/Cognition: Alert;Confused;Requires cueing Oral Cavity - Dentition: Dentures, top Patient Positioning: Upright in bed  Oral Cavity - Oral Hygiene Patient is AT RISK - Oral Care Protocol followed (see row info): Yes   Dysphagia Treatment Treatment focused on: Skilled observation of diet tolerance Treatment Methods/Modalities: Skilled observation Patient observed directly with PO's:  Yes Type of PO's observed: Dysphagia 1 (puree);Thin liquids Feeding: Able to feed self;Needs assist Liquids provided via: Cup;Straw Oral Phase Signs & Symptoms: Prolonged oral phase Type of cueing: Verbal;Tactile;Visual Amount of cueing: Minimal   GO     Kristi Webb, Kristi Webb 05/30/2012, 1:26 PM

## 2012-05-30 NOTE — Progress Notes (Signed)
Excellent summary of hospital admission will work on trying to get further records

## 2012-05-30 NOTE — Progress Notes (Signed)
HOME HEALTH AGENCIES SERVING [place]   Agencies that are Medicare-Certified and are affiliated with [facility]  Telephone Number Address  [facility]   [facility] has ownership interest in this company; however, you are under no obligation to use this agency. [phone] or  [phone] [address] [place], [place] [address]   Agencies that are Medicare-Certified and are not affiliated with [facility]                                                                                 Home Health Agency Telephone Number Address  [facility] [phone] Fax [phone] [address], [address] [place], [place]  [address]  [facility] [phone] or [phone] Fax [phone] [address] [place], [place] [address]  [facility] [phone] Fax [phone] [address] [place], [place] [address]  [facility] [phone] Fax [phone] [address], [address] [place], [place]  [address]  [facility] [phone] Fax [phone] [address], [address] [place], [place] [address]  [facility] [phone] [address] [place], [place] [address]  [facility] [phone]  [address] [place], [place] [address]  [facility] [phone] or [phone] Fax [phone] [address], [address] , [place]  [address]  [facility] [phone] Fax [phone] [address] [place], [place]  [address]  [facility]  [phone] Fax [phone] [address] [place], [place] [address]      

## 2012-05-30 NOTE — Progress Notes (Addendum)
Subjective: Patient says good morning when I said good morning RN: pt is intermittently following commands for the night RN and said thank you; urine 15 cc/hr.    Objective: Vital signs in last 24 hours: Filed Vitals:   05/30/12 1100 05/30/12 1200 05/30/12 1204 05/30/12 1300  BP:  111/59    Pulse: 65 66 71 64  Temp:   97.3 F (36.3 C)   TempSrc:   Oral   Resp: 15 18  15   Height:      Weight:      SpO2: 100% 100% 100% 100%   Weight change:   Intake/Output Summary (Last 24 hours) at 05/30/12 1357 Last data filed at 05/30/12 1300  Gross per 24 hour  Intake 3060.34 ml  Output    505 ml  Net 2555.34 ml   Vitals reviewed. General: resting in bed, NAD, eyes open HEENT: no scleral icterus Cardiac: RRR, no rubs, murmurs or gallops Pulm: ctab Abd: soft, non tender, nondistended, BS present (normal); ab with area that appears to be previous PEG site in epigastric region Ext: warm and well perfused, 1+ edema b/l, ecchymosis to left ankle with Mediplex bandage intact Neuro: nonverbal but answers questions appropriately by nodding head or mouthing words; eyes spontaneously; intermittently obeys verbal command GCS 11-12   Lab Results: Basic Metabolic Panel:  Lab 05/30/12 1610 05/30/12 0819 05/30/12 0540 05/29/12 0625  NA 155* 151* -- --  K 3.0* 2.9* -- --  CL 125* 122* -- --  CO2 22 20 -- --  GLUCOSE 262* 539* -- --  BUN 12 12 -- --  CREATININE 0.52 0.49* -- --  CALCIUM 8.2* 7.6* -- --  MG -- -- 2.5 2.8*  PHOS -- -- 2.3 2.0*   Liver Function Tests:  Lab 05/30/12 0540 05/29/12 0625  AST 15 11  ALT 8 7  ALKPHOS 129* 158*  BILITOT 0.3 0.5  PROT 5.9* 7.2  ALBUMIN 2.3* 3.0*   CBC:  Lab 05/30/12 0540 05/29/12 0956  WBC 9.4 13.1*  NEUTROABS 5.8 9.8*  HGB 11.8* 14.1  HCT 38.7 46.4*  MCV 86.8 87.7  PLT 126* 170   CBG:  Lab 05/28/12 1929  GLUCAP 152*   Thyroid Function Tests:  Lab 05/28/12 1702  TSH 2.563  T4TOTAL --  FREET4 --  T3FREE --  THYROIDAB --    Coagulation:  Lab 05/28/12 1702  LABPROT 17.3*  INR 1.39   Urinalysis:  Lab 05/28/12 1231  COLORURINE YELLOW  LABSPEC 1.025  PHURINE 7.0  GLUCOSEU NEGATIVE  HGBUR NEGATIVE  BILIRUBINUR SMALL*  KETONESUR 15*  PROTEINUR 30*  UROBILINOGEN 1.0  NITRITE NEGATIVE  LEUKOCYTESUR LARGE*   Misc. Labs: none  Micro Results: Recent Results (from the past 240 hour(s))  URINE CULTURE     Status: Normal (Preliminary result)   Collection Time   05/28/12 12:32 PM      Component Value Range Status Comment   Specimen Description URINE, RANDOM   Final    Special Requests NONE   Final    Culture  Setup Time 05/28/2012 17:51   Final    Colony Count 85,000 COLONIES/ML   Final    Culture PROTEUS MIRABILIS   Final    Report Status PENDING   Incomplete   MRSA PCR SCREENING     Status: Normal   Collection Time   05/28/12  4:51 PM      Component Value Range Status Comment   MRSA by PCR NEGATIVE  NEGATIVE Final   CULTURE,  BLOOD (ROUTINE X 2)     Status: Normal (Preliminary result)   Collection Time   05/28/12  9:12 PM      Component Value Range Status Comment   Specimen Description BLOOD RIGHT HAND   Final    Special Requests BOTTLES DRAWN AEROBIC ONLY   Final    Culture  Setup Time 05/29/2012 01:37   Final    Culture     Final    Value: GRAM POSITIVE COCCI IN PAIRS AND     GRAM POSITIVE COCCI IN CLUSTERS     29 Note: Gram Stain Report Called to,Read Back By and Verified With: PAT STRAMOSKI AT 6:22PM 8 13  BY THOMI   Report Status PENDING   Incomplete   CULTURE, BLOOD (ROUTINE X 2)     Status: Normal (Preliminary result)   Collection Time   05/28/12  9:16 PM      Component Value Range Status Comment   Specimen Description BLOOD RIGHT ARM   Final    Special Requests BOTTLES DRAWN AEROBIC ONLY   Final    Culture  Setup Time 05/29/2012 01:37   Final    Culture     Final    Value:        BLOOD CULTURE RECEIVED NO GROWTH TO DATE CULTURE WILL BE HELD FOR 5 DAYS BEFORE ISSUING A FINAL  NEGATIVE REPORT   Report Status PENDING   Incomplete    Studies/Results: Dg Chest Port 1 View  05/30/2012  *RADIOLOGY REPORT*  Clinical Data: Possible aspiration.  PORTABLE CHEST - 1 VIEW  Comparison: 05/28/2012.  Findings: Normal sized heart.  Clear lungs.  Less prominent right suprahilar and paratracheal regions.  Interval left PICC with its tip in the inferior aspect of the superior vena cava.  Clear lungs with normal vascularity.  Diffuse osteopenia.  Bilateral shoulder degenerative changes.  IMPRESSION: No acute abnormality.   Original Report Authenticated By: Darrol Angel, M.D.    Medications:  Scheduled Meds:    . antiseptic oral rinse  15 mL Mouth Rinse QID  . cefTRIAXone (ROCEPHIN)  IV  1 g Intravenous Q24H  . chlorhexidine  15 mL Mouth/Throat BID  . levothyroxine  12.5 mcg Intravenous Daily  . potassium chloride  10 mEq Intravenous Q1 Hr x 4  . potassium chloride  10 mEq Intravenous Q1 Hr x 4  . potassium chloride      . potassium phosphate IVPB (mmol)  20 mmol Intravenous Once  . sodium chloride  500 mL Intravenous Once  . sodium chloride  10-40 mL Intracatheter Q12H  . sodium chloride  3 mL Intravenous Q12H  . DISCONTD: potassium phosphate IVPB (mEq)  40 mEq Intravenous Once   Continuous Infusions:    . dextrose 5 % and 0.45% NaCl 100 mL/hr at 05/30/12 1303  . DISCONTD: dextrose 5 % and 0.45% NaCl 1,000 mL with potassium chloride 20 mEq infusion 130 mL/hr at 05/29/12 1600   PRN Meds:.acetaminophen, sodium chloride Assessment/Plan: 79 y.o female PMH dementia, polyclonal gammopathy, hypothyroidism, thrush, depression, decubitus ulcer prior to admission presents for altered mental status on 8/28 found to have hypernatremia, leukocytosis, elevated anion gap (16) with respiratory alkalosis, tachycardia, sacral decubitus stage 2 ulcer, stage 1 to ankle, FTT, 3/4 SIRS criteria on admission 8/28.    1. Acute hypernatremia  -last bmet q4 last Na was 163 this am trended down to  155 after repeat -free water deficit was 5.8 L on admission, FWD this am is 3.9  L -pt is very dehydrated prob 2/2 decreased po intake which potentially could be related to worsening dementia contributing decreased want for po intake which could be leading to hypernatremia -urine Os >800 (961) consistent with above dx -changing bmet q6 -Changed D51/2NS 150cc/hr-->100cc/hr  2. Altered mental status  -pt actually silently conversing today -multifactorial prob 2/2 baseline dementia, hyerperNa but pending w/u for infection showed +Proteus UTI and 1/2 aerobic blood cxs showing gm +cocci in pairs and clusters which could be a contaminant -GCS 11-12 -q4 neuro checks  -Cont Rocephin   3. +blood culture -1/2 aerobic bottles with gm +cocci in pairs and clusters -may be a contaminant -will follow  4. Proteus UTI -On Rocephin pending sensitivities  5. Leukocytosis, resolved -pt remains afebrile, still intermittently tachycardic, intermittently tachypneic -pending pan cultures (+Proteus urine, 1/2 aerobic blood cultures showing gm +cocci in pairs and clusters) -on Rocephin  6. Decreased urine output -over 24 hours made 425 cc urine out recorded  -strict i/o with foley  7 Tachycardia, intermittent -monitor VS   8.Sacral decubitus ulcer, stage II, ankle with stage 1  -wound nurse consult saw 8/29 with recs of foam dressing per RN -RN to continue wound care qd  9 Dementia  -unknown baseline per daughter pt did not recognize her at baseline -holding Aricept currently   10.thyroid disorder  -h/o subclinical hypothyroidism  -TSH was 2.563 this admission -holding home oral Synthyroid 25 mcg   11. Thrush  -continue oral care   12. FTT (failure to thrive) in adult  -currently NPO, repeat swallow eval showed pt can try thin liq diet which was started today -associated with wt loss, decreased po  -prealbumin 9.7 (low), Albumin was low 3.0   -if we do resume feeds monitor for re-feeding  syndrome (Mag, Phos, K)  13. Pulmonary -previous CXR 04/2011 showed 3.8 mass like consolidation in the R mid lung -previous CT 04/2011 concerning for infection with right upper lobe lesion -CXR this admission with right suprahilar/paratracheal prominence, worrisome for underlying adenopathy -Consider repeat CT chest this admission once stable -repeat CXR 8/30 neg  14. History of acute on chronic b/l SDH -noted at [facility] 11/2011 pt was in neuro ICU placed on Keppra 250 mg bid -will review records further to see if pt needs repeat head CT this admission and needs to be on Keppra  15. DVT Px  -scds   16. F/E/N  -D5W/NS 0.45 % at 150 cc/h via PICC decreased to 100 cc/hr  -bmet q 6, will replace electrolytes prn (will replace K with10 meq x 4 runs) -passed swallow changed to thin liq diet    17. Dispo   -Consulted social work who had been in touch with family   Code Status: DNR     LOS: 2 days   Annett Gula [phone] 05/30/2012, 1:57 PM

## 2012-05-30 NOTE — Progress Notes (Signed)
Internal Medicine Attending  Date: 05/30/2012  Patient name: Kristi Webb Medical record number: [id number] Date of birth: 23-Jul-1932 Age: 78 y.o. Gender: female  I saw and evaluated the patient on a.m. rounds with house staff. I reviewed the resident's note by Dr. Shirlee Latch and I agree with the resident's findings and plans as documented in her note, with the following additional comments.  Patient's mental status is much improved with further correction of her volume status and serum sodium.  Her urine output has also picked up.  Agree with plans to titrate her IV rate with a target of correcting her serum sodium by about 10 mEq per liter over a 24-hour period.  Further assessment with repeat chest CT scan of her previously noted right lung lesion (an oval shaped fluid attenuation along the major fissure within the right upper lung, with a cavitated medial component on chest CT scan done 04/21/2011) can be done once her metabolic abnormalities have corrected.

## 2012-05-31 LAB — URINE CULTURE

## 2012-05-31 LAB — BASIC METABOLIC PANEL
BUN: 7 mg/dL (ref 6–23)
BUN: 6 mg/dL (ref 6–23)
CO2: 21 mEq/L (ref 19–32)
CO2: 22 mEq/L (ref 19–32)
Calcium: 8.5 mg/dL (ref 8.4–10.5)
Calcium: 8.5 mg/dL (ref 8.4–10.5)
Calcium: 8.3 mg/dL — ABNORMAL LOW (ref 8.4–10.5)
Chloride: 121 mEq/L — ABNORMAL HIGH (ref 96–112)
Chloride: 121 mEq/L — ABNORMAL HIGH (ref 96–112)
Creatinine, Ser: 0.51 mg/dL (ref 0.50–1.10)
Creatinine, Ser: 0.49 mg/dL — ABNORMAL LOW (ref 0.50–1.10)
GFR calc Af Amer: >90 mL/min (ref >90)
GFR calc Af Amer: >90 mL/min (ref >90)
GFR calc Af Amer: >90 mL/min (ref >90)
GFR calc Af Amer: >90 mL/min (ref >90)
GFR calc Af Amer: >90 mL/min (ref >90)
GFR calc non Af Amer: >90 mL/min (ref >90)
GFR calc non Af Amer: 90 mL/min — ABNORMAL LOW (ref >90)
Glucose, Bld: 105 mg/dL — ABNORMAL HIGH (ref 70–99)
Potassium: 3.5 mEq/L (ref 3.5–5.1)
Sodium: 154 mEq/L — ABNORMAL HIGH (ref 135–145)
Sodium: 151 mEq/L — ABNORMAL HIGH (ref 135–145)
Sodium: 151 mEq/L — ABNORMAL HIGH (ref 135–145)

## 2012-05-31 LAB — CBC WITH DIFFERENTIAL/PLATELET
Basophils Absolute: 0 10*3/uL (ref 0.0–0.1)
Eosinophils Relative: 2 % (ref 0–5)
HCT: 37.9 % (ref 36.0–46.0)
Lymphocytes Relative: 42 % (ref 12–46)
Lymphs Abs: 3.2 10*3/uL (ref 0.7–4.0)
MCV: 83.8 fL (ref 78.0–100.0)
Monocytes Absolute: 0.4 10*3/uL (ref 0.1–1.0)
Neutro Abs: 3.9 10*3/uL (ref 1.7–7.7)
Platelets: 139 10*3/uL — ABNORMAL LOW (ref 150–400)
RBC: 4.52 MIL/uL (ref 3.87–5.11)
RDW: 15.8 % — ABNORMAL HIGH (ref 11.5–15.5)
WBC: 7.6 10*3/uL (ref 4.0–10.5)

## 2012-05-31 LAB — MAGNESIUM: Magnesium: 2.1 mg/dL (ref 1.5–2.5)

## 2012-05-31 LAB — OCCULT BLOOD X 1 CARD TO LAB, STOOL: Fecal Occult Bld: NEGATIVE

## 2012-05-31 LAB — CULTURE, BLOOD (ROUTINE X 2)

## 2012-05-31 MED ORDER — POTASSIUM CHLORIDE 10 MEQ/50ML IV SOLN
INTRAVENOUS | Status: AC
  Administered 2012-05-31: 10 meq via INTRAVENOUS
  Filled 2012-05-31: qty 50

## 2012-05-31 MED ORDER — POTASSIUM CHLORIDE 10 MEQ/100ML IV SOLN
10.0000 meq | INTRAVENOUS | Status: AC
  Administered 2012-05-31 (×5): 10 meq via INTRAVENOUS
  Filled 2012-05-31: qty 100
  Filled 2012-05-31: qty 400
  Filled 2012-05-31: qty 100

## 2012-05-31 MED ORDER — PNEUMOCOCCAL VAC POLYVALENT 25 MCG/0.5ML IJ INJ
0.5000 mL | INJECTION | INTRAMUSCULAR | Status: AC
  Administered 2012-06-01: 0.5 mL via INTRAMUSCULAR
  Filled 2012-05-31: qty 0.5

## 2012-05-31 MED ORDER — SODIUM CHLORIDE 0.9 % IJ SOLN
10.0000 mL | Freq: Two times a day (BID) | INTRAMUSCULAR | Status: DC
  Administered 2012-05-31 – 2012-06-04 (×4): 10 mL via INTRAVENOUS

## 2012-05-31 NOTE — Progress Notes (Signed)
Subjective: Non-verbal towards MD today besides grunting or slightly nodding to signal yes or no, no acute events overnight  Objective: Vital signs in last 24 hours: Filed Vitals:   05/31/12 0800 05/31/12 1141 05/31/12 1200 05/31/12 1555  BP: 122/74     Pulse: 70  78   Temp:  99.1 F (37.3 C)  98.4 F (36.9 C)  TempSrc:  Tympanic  Oral  Resp: 18  18   Height:      Weight:      SpO2:   100%    Weight change:   Intake/Output Summary (Last 24 hours) at 05/31/12 1647 Last data filed at 05/31/12 1500  Gross per 24 hour  Intake   3470 ml  Output    802 ml  Net   2668 ml   Vitals reviewed. General: resting in bed bent over on left side, NAD, eyes open to verbal command, cooperative on exam and will take deep breathes when asked HEENT: no scleral icterus Cardiac: RRR, no rubs, murmurs or gallops Pulm: ctab Abd: soft, non tender, nondistended, BS present (normal); ab with area that appears to be previous PEG site in epigastric region Ext: warm and well perfused, 1+ edema b/l, ecchymosis to left ankle with Mediplex bandage intact Neuro: nonverbal but answers questions appropriately by nodding head or mouthing words; eyes spontaneously; intermittently obeys verbal command GCS 11-12   Lab Results: Basic Metabolic Panel:  Lab 05/31/12 6045 05/31/12 0500 05/31/12 0455 05/30/12 0540  NA 146* 151* -- --  K 3.1* 3.4* -- --  CL 117* 121* -- --  CO2 21 21 -- --  GLUCOSE 347* 101* -- --  BUN 6 6 -- --  CREATININE 0.51 0.53 -- --  CALCIUM 8.1* 8.4 -- --  MG -- -- 2.1 2.5  PHOS -- -- 2.3 2.3   Liver Function Tests:  Lab 05/30/12 0540 05/29/12 0625  AST 15 11  ALT 8 7  ALKPHOS 129* 158*  BILITOT 0.3 0.5  PROT 5.9* 7.2  ALBUMIN 2.3* 3.0*   CBC:  Lab 05/31/12 0455 05/30/12 0540  WBC 7.6 9.4  NEUTROABS 3.9 5.8  HGB 11.9* 11.8*  HCT 37.9 38.7  MCV 83.8 86.8  PLT 139* 126*   CBG:  Lab 05/28/12 1929  GLUCAP 152*   Thyroid Function Tests:  Lab 05/28/12 1702  TSH 2.563   T4TOTAL --  FREET4 --  T3FREE --  THYROIDAB --   Coagulation:  Lab 05/28/12 1702  LABPROT 17.3*  INR 1.39   Urinalysis:  Lab 05/28/12 1231  COLORURINE YELLOW  LABSPEC 1.025  PHURINE 7.0  GLUCOSEU NEGATIVE  HGBUR NEGATIVE  BILIRUBINUR SMALL*  KETONESUR 15*  PROTEINUR 30*  UROBILINOGEN 1.0  NITRITE NEGATIVE  LEUKOCYTESUR LARGE*   Misc. Labs: none  Micro Results: Recent Results (from the past 240 hour(s))  URINE CULTURE     Status: Normal   Collection Time   05/28/12 12:32 PM      Component Value Range Status Comment   Specimen Description URINE, RANDOM   Final    Special Requests NONE   Final    Culture  Setup Time 05/28/2012 17:51   Final    Colony Count 85,000 COLONIES/ML   Final    Culture PROTEUS MIRABILIS   Final    Report Status 05/31/2012 FINAL   Final    Organism ID, Bacteria PROTEUS MIRABILIS   Final   MRSA PCR SCREENING     Status: Normal   Collection Time   05/28/12  4:51 PM      Component Value Range Status Comment   MRSA by PCR NEGATIVE  NEGATIVE Final   CULTURE, BLOOD (ROUTINE X 2)     Status: Normal   Collection Time   05/28/12  9:12 PM      Component Value Range Status Comment   Specimen Description BLOOD RIGHT HAND   Final    Special Requests BOTTLES DRAWN AEROBIC ONLY   Final    Culture  Setup Time 05/29/2012 01:37   Final    Culture     Final    Value: STAPHYLOCOCCUS SPECIES (COAGULASE NEGATIVE)     Note: THE SIGNIFICANCE OF ISOLATING THIS ORGANISM FROM A SINGLE SET OF BLOOD CULTURES WHEN MULTIPLE SETS ARE DRAWN IS UNCERTAIN. PLEASE NOTIFY THE MICROBIOLOGY DEPARTMENT WITHIN ONE WEEK IF SPECIATION AND SENSITIVITIES ARE REQUIRED.     DIPHTHEROIDS(CORYNEBACTERIUM SPECIES)     Note: Standardized susceptibility testing for this organism is not available.     29 Note: Gram Stain Report Called to,Read Back By and Verified With: PAT STRAMOSKI AT 6:22PM 8 13  BY THOMI   Report Status 05/31/2012 FINAL   Final   CULTURE, BLOOD (ROUTINE X 2)      Status: Normal (Preliminary result)   Collection Time   05/28/12  9:16 PM      Component Value Range Status Comment   Specimen Description BLOOD RIGHT ARM   Final    Special Requests BOTTLES DRAWN AEROBIC ONLY   Final    Culture  Setup Time 05/29/2012 01:37   Final    Culture     Final    Value:        BLOOD CULTURE RECEIVED NO GROWTH TO DATE CULTURE WILL BE HELD FOR 5 DAYS BEFORE ISSUING A FINAL NEGATIVE REPORT   Report Status PENDING   Incomplete    Studies/Results: Dg Chest Port 1 View  05/30/2012  *RADIOLOGY REPORT*  Clinical Data: Possible aspiration.  PORTABLE CHEST - 1 VIEW  Comparison: 05/28/2012.  Findings: Normal sized heart.  Clear lungs.  Less prominent right suprahilar and paratracheal regions.  Interval left PICC with its tip in the inferior aspect of the superior vena cava.  Clear lungs with normal vascularity.  Diffuse osteopenia.  Bilateral shoulder degenerative changes.  IMPRESSION: No acute abnormality.   Original Report Authenticated By: Darrol Angel, M.D.    Medications:  Scheduled Meds:    . antiseptic oral rinse  15 mL Mouth Rinse QID  . cefTRIAXone (ROCEPHIN)  IV  1 g Intravenous Q24H  . chlorhexidine  15 mL Mouth/Throat BID  . levothyroxine  12.5 mcg Intravenous Daily  . pneumococcal 23 valent vaccine  0.5 mL Intramuscular Tomorrow-1000  . potassium chloride  10 mEq Intravenous Q1 Hr x 6  . potassium chloride  10 mEq Intravenous Q1 Hr x 4  . potassium chloride  10 mEq Intravenous Q1 Hr x 2  . sodium chloride  10 mL Intravenous Q12H  . sodium chloride  10-40 mL Intracatheter Q12H  . DISCONTD: sodium chloride  3 mL Intravenous Q12H   Continuous Infusions:    . dextrose 5 % and 0.45% NaCl 100 mL/hr (05/31/12 0825)   PRN Meds:.acetaminophen, sodium chloride Assessment/Plan: 78 y.o female PMH dementia, polyclonal gammopathy, hypothyroidism, thrush, depression, decubitus ulcer prior to admission presents for altered mental status on 8/28 found to have  hypernatremia, leukocytosis, elevated anion gap (16) with respiratory alkalosis, tachycardia, sacral decubitus stage 2 ulcer, stage 1 to ankle, FTT,  3/4 SIRS criteria on admission 8/28.    1. Acute hypernatremia: resolving with hydration -free water deficit was 5.8 L on admission -urine Os >800 (961) consistent with above dx -cont bmet q6 -Changed D51/2NS100cc/hr  Lab 05/31/12 1200 05/31/12 0500 05/31/12 0455 05/31/12 0115 05/30/12 2005  NA 146* 151* 151* 154* 153*    2. Altered mental status: stable -multifactorial prob 2/2 baseline dementia, hyerperNa but pending w/u for infection showed +Proteus UTI and 1/2 aerobic blood cxs showing gm +cocci in pairs and clusters which could be a contaminant -GCS 11-12 -q4 neuro checks  -Cont Rocephin  3. +blood culture -1/2 aerobic bottles with coag neg staph thus likely contaminant   4. Proteus UTI: sensitive to ceftriaxone, Day #3 -cont Rocephin  5. Leukocytosis, resolved  Lab 05/31/12 0455 05/30/12 0540 05/29/12 0956 05/28/12 1104  WBC 7.6 9.4 13.1* 12.6*    6. Decreased urine output -over 24 hours made 425 cc urine out recorded  -strict i/o with foley  7. Tachycardia, intermittent -monitor VS   8.Sacral decubitus ulcer, stage II, ankle with stage 1  -wound nurse consult saw 8/29 with recs of foam dressing per RN -RN to continue wound care qd  9 Dementia  -unknown baseline per daughter pt did not recognize her at baseline -holding Aricept currently   10.thyroid disorder  -h/o subclinical hypothyroidism  -TSH was 2.563 this admission -holding home oral Synthyroid 25 mcg   11. Thrush  -continue oral care   12. FTT (failure to thrive) in adult  -on full liquid diet -associated with wt loss, decreased po  -prealbumin 9.7 (low), Albumin was low 3.0   -will monitor for re-feeding syndrome (Mg and Phos normal) K 3.1 -replete K   13. Pulmonary -previous CXR 04/2011 showed 3.8 mass like consolidation in the R mid  lung -previous CT 04/2011 concerning for infection with right upper lobe lesion -CXR this admission with right suprahilar/paratracheal prominence, worrisome for underlying adenopathy -Consider repeat CT chest this admission once stable -repeat CXR 8/30 neg  14. History of acute on chronic b/l SDH -noted at [facility] 11/2011 pt was in neuro ICU placed on Keppra 250 mg bid -will review records further to see if pt needs repeat head CT this admission and needs to be on Keppra  15. DVT Px  -scds   16. F/E/N  -D5W/NS 0.45 % at 100 cc/h via PICC  -bmet q 6, will replace electrolytes prn (will replace K with10 meq x 4 runs) -passed swallow -->thin liq diet    17. Dispo   -social work consulted  Code Status: DNR     LOS: 3 days   Declin Rajan  05/31/2012, 4:47 PM
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
## 2012-06-01 LAB — BASIC METABOLIC PANEL
BUN: 3 mg/dL — ABNORMAL LOW (ref 6–23)
Calcium: 7.7 mg/dL — ABNORMAL LOW (ref 8.4–10.5)
Chloride: 111 mEq/L (ref 96–112)
Creatinine, Ser: 0.45 mg/dL — ABNORMAL LOW (ref 0.50–1.10)
GFR calc Af Amer: >90 mL/min (ref >90)
GFR calc non Af Amer: >90 mL/min (ref >90)
GFR calc non Af Amer: >90 mL/min (ref >90)
Glucose, Bld: 448 mg/dL — ABNORMAL HIGH (ref 70–99)
Potassium: 3.4 mEq/L — ABNORMAL LOW (ref 3.5–5.1)
Sodium: 140 mEq/L (ref 135–145)
Sodium: 140 mEq/L (ref 135–145)

## 2012-06-01 LAB — CBC WITH DIFFERENTIAL/PLATELET
Basophils Absolute: 0 10*3/uL (ref 0.0–0.1)
Basophils Relative: 0 % (ref 0–1)
Eosinophils Absolute: 0.1 10*3/uL (ref 0.0–0.7)
Hemoglobin: 11.3 g/dL — ABNORMAL LOW (ref 12.0–15.0)
MCHC: 32.1 g/dL (ref 30.0–36.0)
Monocytes Relative: 6 % (ref 3–12)
Neutro Abs: 3.3 10*3/uL (ref 1.7–7.7)
Neutrophils Relative %: 47 % (ref 43–77)
Platelets: 118 10*3/uL — ABNORMAL LOW (ref 150–400)

## 2012-06-01 MED ORDER — POTASSIUM CHLORIDE 10 MEQ/100ML IV SOLN
10.0000 meq | INTRAVENOUS | Status: AC
  Administered 2012-06-01 (×3): 10 meq via INTRAVENOUS
  Filled 2012-06-01: qty 300

## 2012-06-01 MED ORDER — POTASSIUM CHLORIDE 10 MEQ/50ML IV SOLN
10.0000 meq | INTRAVENOUS | Status: AC
  Administered 2012-06-01: 10 meq via INTRAVENOUS
  Filled 2012-06-01 (×2): qty 50

## 2012-06-01 MED ORDER — POTASSIUM CHLORIDE CRYS ER 20 MEQ PO TBCR: 40.0000 meq | EXTENDED_RELEASE_TABLET | Freq: Once | ORAL | Status: DC

## 2012-06-01 MED ORDER — SODIUM CHLORIDE 0.45 % IV SOLN
INTRAVENOUS | Status: DC
  Administered 2012-06-01 – 2012-06-02 (×2): via INTRAVENOUS

## 2012-06-01 NOTE — Progress Notes (Signed)
Subjective:  Pt more verbal today expressing no complaints and oriented to place and self. Per nursing pt refused to eat yesterday even when family attempted to feed her. Pt not combative. Per nursing pt also having some diarrhea.   Objective:  Vital signs in last 24 hours:  Filed Vitals:   06/01/12 0200 06/01/12 0337 06/01/12 0400 06/01/12 0756  BP: 110/61  110/61   Pulse: 85  80   Temp:  98.9 F (37.2 C)  97.4 F (36.3 C)  TempSrc:  Oral  Oral  Resp: 20  22   Height:      Weight:      SpO2: 100%  100%    Weight change:   Intake/Output Summary (Last 24 hours) at 05/31/12 1647 Last data filed at 05/31/12 1500   Gross per 24 hour   Intake  3470 ml   Output  802 ml   Net  2668 ml    Vitals reviewed.  General: resting in bed, NAD, eyes open to verbal command, cooperative on exam and can follow instuctions HEENT: no scleral icterus  Cardiac: RRR, no rubs, murmurs or gallops  Pulm: CTAB, no crackles or wheezes Abd: soft, non tender, nondistended, BS present (normal); ab with area that appears to be previous PEG site in epigastric region  Ext: warm and well perfused, 1+ edema b/l, ecchymosis to left ankle with Mediplex bandage intact  Neuro: verbal with short answers yes/no or one word; open eyes spontaneously; intermittently obeys verbal command GCS 11-12   Lab Results:  Basic Metabolic Panel:  BMET    Component Value Date/Time   NA 140 06/01/2012 0535   K 3.4* 06/01/2012 0535   CL 111 06/01/2012 0535   CO2 22 06/01/2012 0535   GLUCOSE 359* 06/01/2012 0535   BUN 3* 06/01/2012 0535   CREATININE 0.45* 06/01/2012 0535   CALCIUM 7.9* 06/01/2012 0535   GFRNONAA >90 06/01/2012 0535   GFRAA >90 06/01/2012 0535   CBC    Component Value Date/Time   WBC 7.0 06/01/2012 0535   RBC 4.28 06/01/2012 0535   HGB 11.3* 06/01/2012 0535   HCT 35.2* 06/01/2012 0535   PLT 118* 06/01/2012 0535   MCV 82.2 06/01/2012 0535   MCH 26.4 06/01/2012 0535   MCHC 32.1 06/01/2012 0535   RDW 15.4 06/01/2012 0535   LYMPHSABS 3.2  06/01/2012 0535   MONOABS 0.4 06/01/2012 0535   EOSABS 0.1 06/01/2012 0535   BASOSABS 0.0 06/01/2012 0535   CBG:  448 and 359   Medications:  Scheduled Meds:     . antiseptic oral rinse  15 mL Mouth Rinse QID  . cefTRIAXone (ROCEPHIN)  IV  1 g Intravenous Q24H  . chlorhexidine  15 mL Mouth/Throat BID  . levothyroxine  12.5 mcg Intravenous Daily  . pneumococcal 23 valent vaccine  0.5 mL Intramuscular Tomorrow-1000  . potassium chloride  10 mEq Intravenous Q1 Hr x 6  . potassium chloride  10 mEq Intravenous Q1 Hr x 2  . potassium chloride  10 mEq Intravenous Q1 Hr x 2  . potassium chloride  40 mEq Oral Once  . sodium chloride  10 mL Intravenous Q12H  . sodium chloride  10-40 mL Intracatheter Q12H   Continuous Infusions:   .  dextrose 5 % and 0.45% NaCl  100 mL/hr (05/31/12 0825)    PRN Meds:.acetaminophen, sodium chloride   Assessment/Plan:  78 y.o female PMH dementia, polyclonal gammopathy, hypothyroidism, thrush, depression, decubitus ulcer prior to admission presents for altered mental  status on 8/28 found to have hypernatremia, leukocytosis, elevated anion gap (16) with respiratory alkalosis, tachycardia, sacral decubitus stage 2 ulcer, stage 1 to ankle, FTT, 3/4 SIRS criteria on admission 8/28. Hypernatremia, leukocytosis, AG, tachycardia all resolved hospital day 4.   1. Acute hypernatremia: resolving with hydration  -free water deficit was 5.8 L on admission urine Os >800 (961) consistent with above dx  -cont bmet q6  - Discontinue D51/2NS100cc/hr but pt eating and having elevated CBG in 300s  -switch to 1/2 NS 100cc/hr -strict I/O 1450cc in last 24hr   Ref. Range 05/31/2012 12:00 05/31/2012 12:30 05/31/2012 18:00 06/01/2012 00:00 06/01/2012 05:35  Sodium Latest Range: 135-145 mEq/L 146 (H)  146 (H) 140 140   2. Altered mental status: stable  -multifactorial prob 2/2 baseline dementia, +Proteus UTI and 1/2 aerobic blood cxs showing gm +cocci in pairs and clusters which could be a  contaminant  -GCS 11-12  -q4 neuro checks  -Cont Rocephin   3. +blood culture  -1/2 aerobic bottles with coag neg staph thus likely contaminant  -final Cx pending  4. Proteus UTI: sensitive to ceftriaxone, Day #4  -cont Rocephin   5.Sacral decubitus ulcer, stage II, ankle with stage 1  -wound nurse consult saw 8/29 with recs of foam dressing RN to continue wound care qd   6. Dementia  -unknown baseline per daughter pt did not recognize her at baseline  -holding Aricept currently   7.thyroid disorder  -h/o subclinical hypothyroidism  -TSH was 2.563 this admission  -holding home oral Synthyroid 25 mcg   8. Thrush  -continue oral care   9. FTT (failure to thrive) in adult  -on full liquid diet and tolerating intermittently 2/2 mood -associated with wt loss, decreased po  -prealbumin 9.7 (low), Albumin was low 3.0  -will monitor for re-feeding syndrome (Mg and Phos normal)   -K 3.4 today replete 20 IV K given since pt not tolerating PO consistently  10. Concerning pulmonary adenopathy  -previous CXR 04/2011 showed 3.8 mass like consolidation in the R mid lung  -previous CT 04/2011 concerning for infection with right upper lobe lesion  -CXR this admission with right suprahilar/paratracheal prominence, worrisome for underlying adenopathy  -Consider repeat CT chest this admission once stable  -repeat CXR 8/30 neg   11. History of acute on chronic b/l SDH  -noted at [facility] 11/2011 pt was in neuro ICU placed on Keppra 250 mg bid  -will review records further to see if pt needs repeat head CT this admission and needs to be on Keppra   12. DVT Px  -scds given slight thrombocytopenia  13. F/E/N  -NS 0.45 % at 100 cc/h via PICC given recent elevated CBGs in 300s HgbA1c 5.7 on 6/13 -bmet q 6, will replace electrolytes prn (will replace K with10 meq x 4 runs)  -passed swallow -->thin liq diet   14. Dispo  -social work consulted  Code Status: DNR  Christen Bame, MD  PGY-1 [phone]

## 2012-06-01 NOTE — Progress Notes (Signed)
Internal Medicine Attending  Date: 06/01/2012  Patient name: Kristi Webb Medical record number: [id number] Date of birth: 09-22-1932 Age: 78 y.o. Gender: female  I saw and evaluated the patient, and discussed her care with house staff.  She is more alert, answers a few questions, is doing well.  Her hypernatremia has corrected.  She is now on maintenance IV fluid, and if she does well with oral intake we can discontinue maintenance IV fluid.  We could reduce the frequency of her labs now that her sodium has corrected. She will need a CT scan of her chest to follow up the previously noted abnormality; would discuss with radiology whether IV contrast will be needed for that study.

## 2012-06-01 NOTE — ED Provider Notes (Signed)
I saw and evaluated the patient, reviewed the resident's note and I agree with the findings and plan.  Cheri Guppy, MD 06/01/12 [phone]

## 2012-06-02 DIAGNOSIS — R627 Adult failure to thrive: Secondary | ICD-10-CM

## 2012-06-02 DIAGNOSIS — F0390 Unspecified dementia without behavioral disturbance: Secondary | ICD-10-CM

## 2012-06-02 LAB — BASIC METABOLIC PANEL
BUN: 3 mg/dL — ABNORMAL LOW (ref 6–23)
BUN: 3 mg/dL — ABNORMAL LOW (ref 6–23)
CO2: 23 mEq/L (ref 19–32)
CO2: 24 mEq/L (ref 19–32)
Chloride: 109 mEq/L (ref 96–112)
Chloride: 108 mEq/L (ref 96–112)
Creatinine, Ser: 0.43 mg/dL — ABNORMAL LOW (ref 0.50–1.10)
Glucose, Bld: 379 mg/dL — ABNORMAL HIGH (ref 70–99)
Potassium: 3.6 mEq/L (ref 3.5–5.1)
Potassium: 3.3 mEq/L — ABNORMAL LOW (ref 3.5–5.1)

## 2012-06-02 LAB — GLUCOSE, CAPILLARY: Glucose-Capillary: 98 mg/dL (ref 70–99)

## 2012-06-02 MED ORDER — POTASSIUM CHLORIDE 20 MEQ/15ML (10%) PO LIQD: 20.0000 meq | Freq: Two times a day (BID) | ORAL | Status: DC

## 2012-06-02 MED ORDER — POTASSIUM CHLORIDE CRYS ER 20 MEQ PO TBCR
20.0000 meq | EXTENDED_RELEASE_TABLET | Freq: Two times a day (BID) | ORAL | Status: DC
  Administered 2012-06-02 – 2012-06-04 (×5): 20 meq via ORAL
  Filled 2012-06-02 (×6): qty 1

## 2012-06-02 MED ORDER — INSULIN ASPART 100 UNIT/ML ~~LOC~~ SOLN
0.0000 [IU] | Freq: Three times a day (TID) | SUBCUTANEOUS | Status: DC
  Administered 2012-06-03: 2 [IU] via SUBCUTANEOUS

## 2012-06-02 MED ORDER — POTASSIUM CHLORIDE IN NACL 20-0.45 MEQ/L-% IV SOLN
INTRAVENOUS | Status: DC
  Administered 2012-06-02 – 2012-06-03 (×2): via INTRAVENOUS
  Filled 2012-06-02 (×6): qty 1000
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
## 2012-06-02 NOTE — Progress Notes (Signed)
Internal Medicine Attending  Date: 06/02/2012  Patient name: Kristi Webb Medical record number: [id number] Date of birth: [birth year] Age: 78 y.o. Gender: female  I saw and evaluated the patient, and discussed her care with house staff. I reviewed the resident's note by Dr. Burtis Junes and I agree with the resident's findings and plans as documented in her note, with the following additional comments.  Patient has persisting hyperglycemia; would use a sliding scale insulin protocol with pre-meal checks while we are assessing her need for a basal regimen.  She will need a repeat chest CT scan this admission; would discuss with radiology to see if it should be done with IV contrast.  Dr. Josem Kaufmann will take over as attending physician tomorrow 06/03/2012.

## 2012-06-02 NOTE — Progress Notes (Addendum)
Speech Language Pathology Dysphagia Treatment Patient Details Name: KASSIDIE HENDRIKS MRN: [id number] DOB: 08/09/1932 Today's Date: 06/02/2012 Time: 1000-1029 SLP Time Calculation (min): 29 min  Assessment / Plan / Recommendation Clinical Impression  Pt maximally awake and interactive, responbd to automatic social communication. SLP provided trials of thin liquids and puree, allowing pt to feed herself, eliciting a more automatic response. Prolonged oral phase still present, but pt cues herself to swallow by presenting consecutive boluses. No observed s/s of aspiration. Recommned upgrade to dys 1 (purre) diet with thin liquids.  Compensatory strategies for self feeding posted in room, discussed with RN.     Diet Recommendation  Initiate / Change Diet: Dysphagia 1 (puree);Thin liquid    SLP Plan Continue with current plan of care   Pertinent Vitals/Pain NA   Swallowing Goals  SLP Swallowing Goals Patient will consume recommended diet without observed clinical signs of aspiration with: Minimal assistance Patient will utilize recommended strategies during swallow to increase swallowing safety with: Total assistance  General Temperature Spikes Noted: No Respiratory Status: Room air Behavior/Cognition: Alert;Confused;Requires cueing Oral Cavity - Dentition: Dentures, top Patient Positioning: Upright in bed  Oral Cavity - Oral Hygiene Does patient have any of the following "at risk" factors?: Nutritional status - inadequate Brush patient's teeth BID with toothbrush (using toothpaste with fluoride): Yes Patient is AT RISK - Oral Care Protocol followed (see row info): Yes   Dysphagia Treatment Treatment focused on: Upgraded PO texture trials;Skilled observation of diet tolerance;Facilitation of oral phase;Facilitation of pharyngeal phase Treatment Methods/Modalities: Skilled observation Patient observed directly with PO's: Yes Type of PO's observed: Dysphagia 1 (puree);Thin  liquids Feeding: Able to feed self;Needs assist Liquids provided via: Cup;Straw Oral Phase Signs & Symptoms: Prolonged bolus formation (oral holding) Pharyngeal Phase Signs & Symptoms: Suspected delayed swallow initiation Type of cueing: Verbal;Tactile;Visual Amount of cueing: Minimal   GO     Maryclare Nydam, [name] 06/02/2012, 10:33 AM

## 2012-06-02 NOTE — Progress Notes (Signed)
Subjective:  No acute events overnight. Pt more verbal today expressing no complaints and oriented to place and self. Pt was sitting up and eating.  Per nursing pt doing much better today. Social worker was there evaluating placement situation for patient.   Objective:  Vital signs in last 24 hours:  Filed Vitals:   06/02/12 0000 06/02/12 0339 06/02/12 0340 06/02/12 0813  BP: 101/55  118/68 95/56  Pulse: 88  76 74  Temp:  98 F (36.7 C)  98.2 F (36.8 C)  TempSrc:  Oral  Oral  Resp: 18  19 18   Height:      Weight:      SpO2: 100%  100% 100%   Weight change:   Intake/Output Summary (Last 24 hours) at 05/31/12 1647 Last data filed at 05/31/12 1500   Gross per 24 hour   Intake  3470 ml   Output  802 ml   Net  2668 ml    Vitals reviewed.  General: sitting up in bed, NAD, eyes open to verbal command, cooperative on exam and can follow instuctions HEENT: no scleral icterus  Cardiac: RRR, no rubs, murmurs or gallops  Pulm: CTAB, no crackles or wheezes Abd: soft, non tender, nondistended, BS present (normal); ab with area that appears to be previous PEG site in epigastric region  Ext: warm and well perfused, 1+ edema b/l, ecchymosis to left ankle with Mediplex bandage intact  Neuro: alert, verbal with short answers intermittently obeys verbal command GCS 11-12   Lab Results:  Basic Metabolic Panel:  BMET    Component Value Date/Time   NA 139 06/02/2012 0500   K 3.3* 06/02/2012 0500   CL 108 06/02/2012 0500   CO2 24 06/02/2012 0500   GLUCOSE 379* 06/02/2012 0500   BUN 3* 06/02/2012 0500   CREATININE 0.41* 06/02/2012 0500   CALCIUM 7.9* 06/02/2012 0500   GFRNONAA >90 06/02/2012 0500   GFRAA >90 06/02/2012 0500   CBC    Component Value Date/Time   WBC 7.0 06/01/2012 0535   RBC 4.28 06/01/2012 0535   HGB 11.3* 06/01/2012 0535   HCT 35.2* 06/01/2012 0535   PLT 118* 06/01/2012 0535   MCV 82.2 06/01/2012 0535   MCH 26.4 06/01/2012 0535   MCHC 32.1 06/01/2012 0535   RDW 15.4 06/01/2012 0535   LYMPHSABS 3.2  06/01/2012 0535   MONOABS 0.4 06/01/2012 0535   EOSABS 0.1 06/01/2012 0535   BASOSABS 0.0 06/01/2012 0535   CBG:  448 and 359   Medications:  Scheduled Meds:     . antiseptic oral rinse  15 mL Mouth Rinse QID  . cefTRIAXone (ROCEPHIN)  IV  1 g Intravenous Q24H  . chlorhexidine  15 mL Mouth/Throat BID  . levothyroxine  12.5 mcg Intravenous Daily  . pneumococcal 23 valent vaccine  0.5 mL Intramuscular Tomorrow-1000  . potassium chloride  10 mEq Intravenous Q1 Hr x 3  . potassium chloride  20 mEq Oral BID  . sodium chloride  10 mL Intravenous Q12H  . sodium chloride  10-40 mL Intracatheter Q12H  . DISCONTD: potassium chloride  40 mEq Oral Once   Continuous Infusions:   .  dextrose 5 % and 0.45% NaCl  100 mL/hr (05/31/12 0825)    PRN Meds:.acetaminophen, sodium chloride   Assessment/Plan:  78 y.o female PMH dementia, polyclonal gammopathy, hypothyroidism, thrush, depression, decubitus ulcer prior to admission presents for altered mental status on 8/28 found to have hypernatremia, leukocytosis, elevated anion gap (16) with respiratory alkalosis, tachycardia,  sacral decubitus stage 2 ulcer, stage 1 to ankle, FTT, 3/4 SIRS criteria on admission 8/28. Hypernatremia, leukocytosis, AG, tachycardia all resolved hospital day 5.   1. FTT (failure to thrive) in adult  -speech evaluated today and since pt improving tolerating thicker liquids- dysphagia 1 diet -associated with wt loss, decreased po  -prealbumin 9.7 (low), Albumin was low 3.0  -will monitor for re-feeding syndrome (Mg and Phos normal)  -pt continues to be hypokalemic transition fluids 1/2NS + K and K solution   2. Altered mental status: stable  -multifactorial prob 2/2 baseline dementia, +Proteus UTI and 1/2 aerobic blood cxs showing gm +cocci in pairs and clusters which could be a contaminant  -GCS 11-12  -q4 neuro checks  -Cont Rocephin and last dose will be 06/03/12 at 9AM  3. +blood culture and Proteus UTI: sensitive to  ceftriaxone, Day #5  -1/2 aerobic bottles with coag neg staph thus likely contaminant final Cx pending -pt will complete on 06/03/12 for total of 6 days rocephin  4.Sacral decubitus ulcer, stage II, ankle with stage 1  -wound nurse consult saw 8/29 with recs of foam dressing RN to continue wound care qd   5. Dementia  -unknown baseline per daughter pt did not recognize her at baseline  -holding Aricept currently   6.thyroid disorder  -h/o subclinical hypothyroidism  -TSH was 2.563 this admission  -holding home oral Synthyroid 25 mcg   7. Thrush  -continue oral care   8. Concerning pulmonary adenopathy  -previous CXR 04/2011 showed 3.8 mass like consolidation in the R mid lung  -previous CT 04/2011 concerning for infection with right upper lobe lesion  -CXR this admission with right suprahilar/paratracheal prominence, worrisome for underlying adenopathy  -Consider repeat CT chest this admission once stable  -repeat CXR 8/30 neg   9. History of acute on chronic b/l SDH  -noted at [facility] 11/2011 pt was in neuro ICU placed on Keppra 250 mg bid  -will review records further to see if pt needs repeat head CT this admission and needs to be on Keppra   10. DVT Px  -scds given slight thrombocytopenia  11. F/E/N  -1/2 NS 0.45 % at 100 cc/h via PICC given recent elevated CBGs in 300s HgbA1c 5.7 on 6/13 -bmet q 6, will replace electrolytes prn (will replace K with10 meq x 4 runs)  -dsyphagia 1  12. Dispo  -social work consulted and feels pt can be d/c home  Code Status: DNR  Christen Bame, MD PGY-1 [phone]

## 2012-06-03 LAB — BASIC METABOLIC PANEL
BUN: 4 mg/dL — ABNORMAL LOW (ref 6–23)
CO2: 24 mEq/L (ref 19–32)
Chloride: 107 mEq/L (ref 96–112)
Creatinine, Ser: 0.4 mg/dL — ABNORMAL LOW (ref 0.50–1.10)
Potassium: 4.8 mEq/L (ref 3.5–5.1)

## 2012-06-03 LAB — GLUCOSE, CAPILLARY
Glucose-Capillary: 77 mg/dL (ref 70–99)
Glucose-Capillary: 105 mg/dL — ABNORMAL HIGH (ref 70–99)
Glucose-Capillary: 120 mg/dL — ABNORMAL HIGH (ref 70–99)

## 2012-06-03 MED ORDER — BIOTENE DRY MOUTH MT LIQD
15.0000 mL | Freq: Two times a day (BID) | OROMUCOSAL | Status: DC
  Administered 2012-06-03 – 2012-06-04 (×2): 15 mL via OROMUCOSAL

## 2012-06-03 MED ORDER — FLUCONAZOLE 150 MG PO TABS
150.0000 mg | ORAL_TABLET | Freq: Once | ORAL | Status: AC
  Administered 2012-06-03: 150 mg via ORAL
  Filled 2012-06-03 (×2): qty 1

## 2012-06-03 MED ORDER — ENSURE COMPLETE PO LIQD
237.0000 mL | Freq: Two times a day (BID) | ORAL | Status: DC
  Administered 2012-06-04: 237 mL via ORAL

## 2012-06-03 NOTE — Progress Notes (Signed)
Nutrition Follow-up  Intervention:   1. Ensure Complete po BID, each supplement provides 350 kcal and 13 grams of protein. 2. Encourage protein food intake  3. RD will continue to follow    Assessment:   Pt more alert today. Provided some yes/no answers.  Diet was advanced to full liquid on 8/30 and then to D1 on 9/2. Refeeding labs have looked good.  Plans to d/c home with family likely tomorrow.  Pt with pressure ulcers, now that she is taking PO's, will likely benefit from increased protein.   Diet Order:  Dysphagia 1, thin liquids  PO intake: 50-75% Supplements: none   Meds: Scheduled Meds:   . antiseptic oral rinse  15 mL Mouth Rinse QID  . cefTRIAXone (ROCEPHIN)  IV  1 g Intravenous Q24H  . chlorhexidine  15 mL Mouth/Throat BID  . insulin aspart  0-9 Units Subcutaneous TID WC  . levothyroxine  12.5 mcg Intravenous Daily  . potassium chloride  20 mEq Oral BID  . sodium chloride  10 mL Intravenous Q12H  . sodium chloride  10-40 mL Intracatheter Q12H   Continuous Infusions:   . DISCONTD: 0.45 % NaCl with KCl 20 mEq / L Stopped (06/03/12 1130)   PRN Meds:.acetaminophen, sodium chloride  Labs:  CMP     Component Value Date/Time   NA 136 06/03/2012 0430   K 4.8 06/03/2012 0430   CL 107 06/03/2012 0430   CO2 24 06/03/2012 0430   GLUCOSE 85 06/03/2012 0430   BUN 4* 06/03/2012 0430   CREATININE 0.40* 06/03/2012 0430   CALCIUM 8.1* 06/03/2012 0430   PROT 5.9* 05/30/2012 0540   ALBUMIN 2.3* 05/30/2012 0540   AST 15 05/30/2012 0540   ALT 8 05/30/2012 0540   ALKPHOS 129* 05/30/2012 0540   BILITOT 0.3 05/30/2012 0540   GFRNONAA >90 06/03/2012 0430   GFRAA >90 06/03/2012 0430   Sodium  Date/Time Value Range Status  06/03/2012  4:30 AM 136  135 - 145 mEq/L Final  06/02/2012  5:00 AM 139  135 - 145 mEq/L Final  06/02/2012  1:00 AM 139  135 - 145 mEq/L Final    Potassium  Date/Time Value Range Status  06/03/2012  4:30 AM 4.8  3.5 - 5.1 mEq/L Final     DELTA CHECK NOTED     NO VISIBLE HEMOLYSIS    06/02/2012  5:00 AM 3.3* 3.5 - 5.1 mEq/L Final  06/02/2012  1:00 AM 3.6  3.5 - 5.1 mEq/L Final    Phosphorus  Date/Time Value Range Status  05/31/2012  4:55 AM 2.3  2.3 - 4.6 mg/dL Final  9/60/4540  9:81 AM 2.3  2.3 - 4.6 mg/dL Final  1/91/4782  9:56 AM 2.0* 2.3 - 4.6 mg/dL Final    Magnesium  Date/Time Value Range Status  05/31/2012  4:55 AM 2.1  1.5 - 2.5 mg/dL Final  11/14/863  7:84 AM 2.5  1.5 - 2.5 mg/dL Final  6/96/2952  8:41 AM 2.8* 1.5 - 2.5 mg/dL Final       Intake/Output Summary (Last 24 hours) at 06/03/12 1602 Last data filed at 06/03/12 1400  Gross per 24 hour  Intake   2240 ml  Output   3050 ml  Net   -810 ml    Weight Status:  133 lbs, up 16 lbs from admission, likely some from fluids.   Re-estimated needs:  1600-1850 kcal, 66-80 gm protein  Nutrition Dx:  Inadequate oral intake r/t AMS AEB Body mass index is 17.94 kg/(m^2).  Goal:  Initiation of EN goal no longer applicable  New Goal: PO intake of meals and supplements to meet 90-100% of estimated nutrition needs  Monitor:  PO intake, weight, labs   Clarene Duke RD, LDN Pager [phone] After Hours pager [phone]

## 2012-06-03 NOTE — Progress Notes (Signed)
Medical Student Daily Progress Note  Subjective: 24 Hour Events : No acute events overnight. Pt is markedly improved, alert and responsive, answering some questions and following commands. Very pleasant. She is sitting up in bed eating scrambled eggs this morning and says she has no additional issues we can help her with. Denies any pain.   Objective: Vital signs in last 24 hours: Filed Vitals:   06/02/12 2013 06/02/12 2300 06/03/12 0000 06/03/12 0400  BP:   147/92 115/59  Pulse:   45   Temp: 99.4 F (37.4 C) 98.9 F (37.2 C)  98.4 F (36.9 C)  TempSrc:  Oral  Oral  Resp:   22 15  Height:      Weight:    60.7 kg (133 lb 13.1 oz)  SpO2: 100% 96% 97% 95%    Intake/Output Summary (Last 24 hours) at 06/03/12 0824 Last data filed at 06/03/12 0700  Gross per 24 hour  Intake   2530 ml  Output   2675 ml  Net   -145 ml   Physical Exam: Vitals reviewed. General: sitting up in bed, NAD, feeding self scrambled eggs HEENT: no scleral icterus Cardiac: RRR, no rubs, murmurs or gallops Pulm: clear to auscultation bilaterally Abd: soft, nontender, nondistended Ext: warm and well perfused, 1+ edema bilaterally. Left ankle ulcer with Mediplex bandage intact Neuro: alert, answers questions, follows commands. Markedly improved.   Lab Results: Basic Metabolic Panel:  Lab 06/03/12 1610 06/02/12 0500 05/31/12 0455 05/30/12 0540  NA 136 139 -- --  K 4.8 3.3* -- --  CL 107 108 -- --  CO2 24 24 -- --  GLUCOSE 85 379* -- --  BUN 4* 3* -- --  CREATININE 0.40* 0.41* -- --  CALCIUM 8.1* 7.9* -- --  MG -- -- 2.1 2.5  PHOS -- -- 2.3 2.3   Liver Function Tests:  Lab 05/30/12 0540 05/29/12 0625  AST 15 11  ALT 8 7  ALKPHOS 129* 158*  BILITOT 0.3 0.5  PROT 5.9* 7.2  ALBUMIN 2.3* 3.0*   CBC:  Lab 06/01/12 0535 05/31/12 0455  WBC 7.0 7.6  NEUTROABS 3.3 3.9  HGB 11.3* 11.9*  HCT 35.2* 37.9  MCV 82.2 83.8  PLT 118* 139*   CBG:  Lab 06/02/12 2105 06/02/12 1648 05/28/12 1929    GLUCAP 98 92 152*   Hemoglobin A1C:  Lab 05/28/12 1702  HGBA1C 5.7*   Thyroid Function Tests:  Lab 05/28/12 1702  TSH 2.563  T4TOTAL --  FREET4 --  T3FREE --  THYROIDAB --   Coagulation:  Lab 05/28/12 1702  LABPROT 17.3*  INR 1.39   Anemia Panel:  Lab 05/29/12 0956  VITAMINB12 1400*  FOLATE 8.0  FERRITIN 224  TIBC 153*  IRON 33*  RETICCTPCT 0.6   Urinalysis:  Lab 05/28/12 1231  COLORURINE YELLOW  LABSPEC 1.025  PHURINE 7.0  GLUCOSEU NEGATIVE  HGBUR NEGATIVE  BILIRUBINUR SMALL*  KETONESUR 15*  PROTEINUR 30*  UROBILINOGEN 1.0  NITRITE NEGATIVE  LEUKOCYTESUR LARGE*    Micro Results: Recent Results (from the past 240 hour(s))  URINE CULTURE     Status: Normal   Collection Time   05/28/12 12:32 PM      Component Value Range Status Comment   Specimen Description URINE, RANDOM   Final    Special Requests NONE   Final    Culture  Setup Time 05/28/2012 17:51   Final    Colony Count 85,000 COLONIES/ML   Final    Culture PROTEUS  MIRABILIS   Final    Report Status 05/31/2012 FINAL   Final    Organism ID, Bacteria PROTEUS MIRABILIS   Final   MRSA PCR SCREENING     Status: Normal   Collection Time   05/28/12  4:51 PM      Component Value Range Status Comment   MRSA by PCR NEGATIVE  NEGATIVE Final   CULTURE, BLOOD (ROUTINE X 2)     Status: Normal   Collection Time   05/28/12  9:12 PM      Component Value Range Status Comment   Specimen Description BLOOD RIGHT HAND   Final    Special Requests BOTTLES DRAWN AEROBIC ONLY   Final    Culture  Setup Time 05/29/2012 01:37   Final    Culture     Final    Value: STAPHYLOCOCCUS SPECIES (COAGULASE NEGATIVE)     Note: THE SIGNIFICANCE OF ISOLATING THIS ORGANISM FROM A SINGLE SET OF BLOOD CULTURES WHEN MULTIPLE SETS ARE DRAWN IS UNCERTAIN. PLEASE NOTIFY THE MICROBIOLOGY DEPARTMENT WITHIN ONE WEEK IF SPECIATION AND SENSITIVITIES ARE REQUIRED.     DIPHTHEROIDS(CORYNEBACTERIUM SPECIES)     Note: Standardized  susceptibility testing for this organism is not available.     29 Note: Gram Stain Report Called to,Read Back By and Verified With: PAT STRAMOSKI AT 6:22PM 8 13  BY THOMI   Report Status 05/31/2012 FINAL   Final   CULTURE, BLOOD (ROUTINE X 2)     Status: Normal (Preliminary result)   Collection Time   05/28/12  9:16 PM      Component Value Range Status Comment   Specimen Description BLOOD RIGHT ARM   Final    Special Requests BOTTLES DRAWN AEROBIC ONLY   Final    Culture  Setup Time 05/29/2012 01:37   Final    Culture     Final    Value:        BLOOD CULTURE RECEIVED NO GROWTH TO DATE CULTURE WILL BE HELD FOR 5 DAYS BEFORE ISSUING A FINAL NEGATIVE REPORT   Report Status PENDING   Incomplete    Medications: I have reviewed the patient's current medications. Scheduled Meds:   . antiseptic oral rinse  15 mL Mouth Rinse QID  . cefTRIAXone (ROCEPHIN)  IV  1 g Intravenous Q24H  . chlorhexidine  15 mL Mouth/Throat BID  . insulin aspart  0-9 Units Subcutaneous TID WC  . levothyroxine  12.5 mcg Intravenous Daily  . potassium chloride  20 mEq Oral BID  . sodium chloride  10 mL Intravenous Q12H  . sodium chloride  10-40 mL Intracatheter Q12H  . DISCONTD: potassium chloride  20 mEq Oral BID   Continuous Infusions:   . 0.45 % NaCl with KCl 20 mEq / L 100 mL/hr at 06/03/12 0100  . DISCONTD: sodium chloride 100 mL/hr at 06/02/12 0613   PRN Meds:.acetaminophen, sodium chloride  Assessment/Plan: [name] is a 79yo female with dementia who presented to the ED on 05/28/12 with acutely altered mental status. On admission she had significant hypernatremia at Na 174. She also has decubitus stage 2 sacral ulcer, stage 1 ankle ulcer, as well as oral thrush and failure to thrive. She has PMH of dementia, depression, and hypothyroidism.   1. Hypernatremia -resolved: 174-->155-->146--> 139-->136 -D/c'd IVF 06/03/12 -Modify bmet q4-->q12 06/03/12 -Replace electrolytes prn   2. Altered Mental Status  -improved: GCS 6-->11-12-->14-15  -Likely 2/2 hypernatremia, baseline dementia, possible UTI  -GCS 14-15 this morning: verbally cconverses,  intermittently oriented to name, follows commands, spontaneously opens eyes  -Consider CT head/neck, non-contrast  -UA suspicious for UTI, empirically tx'd w/ Rocephin, last dose today 06/03/12  3. Sacral Decubitus Ulcer, stage 2  -Mediplex in place -Keep clean and dry, avoid pressure to area as possible, frequent turns  -Wound care nurse c/s ordered air mattress, says nothing further to do  4. Ankle Decubitus Ulcer, stage 1  -Mediplex in place  -Keep clean and dry, avoid pressure to area, frequent turns  -Wound care nurse c/s ordered air mattress, says nothing further to do  5. Oral Thrush - improved  -Nystatin started 05/26/12 per PCP  -Fluconazole 150mg  1st dose 05/27/12, next dose 06/03/12 per PCP  -Appears significantly improved after 1st dose fluconazole last Tues night; due for 2nd dose tonight 06/03/12   6. Failure to Thrive / Diet - weight trending up: 117-->121-->133lb  -Significant weight loss per daughter's report, 1-69yrs  -BMI17.94 on admission, weight trending up with fluids  -Tolerating dysphagia 1 diet; eating scrambled eggs this AM -Nutrition c/s: monitor Mg, K, and phos and replete as needed; patient is at risk for refeeding syndrome. -Hypokalemia improving; Mg and phos normal   7. Hypothyroidism - prior diagnosis  -Levothyroxine per PCP, last renewed 05/19/12, holding for now  -TSH 2.563 this admission   8. Pulmonary Mass - noted previously, consider further w/u -Previous CXR 7/12: 3.8 mass like consolidation in the R mid lung  -Previous CT 7/12: concerning for infection with R upper lobe lesion  -This admission CXR 05/28/12: R suprahilar/paratracheal prominence, worrisome for underlying adenopathy  -Repeat CXR 05/30/12: neg. Will not pursue further w/u; pt does not meet SIRS criteria   9. DVT Px  -scds   10. Dispo    -Admitted to stepdown unit  -Social work c/s, spiritual c/s  -Daughter wants to change agencies [facility] to [facility] -Ready for d/c; likely tomorrow. Will d/c home with legal guardian (daughter Larene Beach)   Code status: DNR/DNI   LOS: 6 days   This is a Psychologist, occupational Note.  The care of the patient was discussed with Dr. Vance Peper assessment and plan formulated with their assistance.  Please see their attached note for official documentation of the daily encounter.  Estle Huguley E 06/03/2012, 8:24 AM

## 2012-06-03 NOTE — Progress Notes (Signed)
CSW met with pt daughter, who is poa, and pt granddaughter along with rn case manager to discuss pt dc plans. CSW and pt daughter discussed short term rehab at skilled nursing facility. Please see placement note for progress. Pt daughter shared that 24 hour care can not be arranged at home quick enough but short term rehab for a 1 week or two would allow pt daughter to arrange private care at home. Pt daughter agreed to look at some facilities on the way home while csw initiated search. Pt daughter aware of pt anticipated discharge of tomorrow. .Please contact service line csw for further csw needs and assistance with discharge.   Kristi Webb, Kristi Webb  [phone] .06/03/2012 1713pm

## 2012-06-03 NOTE — Progress Notes (Signed)
OT Cancellation Note  Treatment cancelled today due to medical issues with patient which prohibited therapy (bedrest ordered at this time). OT to hold evaluation pending increased activity order set.  Harrel Carina [place]wood Pager: [phone]  06/03/2012, 7:03 AM

## 2012-06-03 NOTE — Progress Notes (Signed)
Internal Medicine Attending  Date: 06/03/2012  Patient name: Kristi Webb Medical record number: [id number] Date of birth: 1932-09-15 Age: 78 y.o. Gender: female  I saw and evaluated the patient. I reviewed the resident's note by Dr. Burtis Junes and I agree with the resident's findings and plans as documented in her progress note.  Ms. Martus' hypernatremia has resolved and supposedly her mental status has returned to baseline with a Na of 136 this morning.  Arrangements for short term rehab are underway while the daughter (guardian) arranges 24 hour care at home.  It is anticipated she will be discharged to the SNF within the next 24-48 hours.  In the meantime, we will continue her chronic care regimen.
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
## 2012-06-03 NOTE — Progress Notes (Signed)
Subjective: No acute events overnight per nursing and had no elevated BGs overnight. Pt tolerating food well and improved appetite. Pt is responsive today speaking in short sentences. Pt denies any pain. Pt oriented to self and follows commands. Pt feels safe going home with daughter saying "that would be nice."  Objective: Vital signs in last 24 hours: Filed Vitals:   06/02/12 2013 06/02/12 2300 06/03/12 0000 06/03/12 0400  BP:   147/92 115/59  Pulse:   45   Temp: 99.4 F (37.4 C) 98.9 F (37.2 C)  98.4 F (36.9 C)  TempSrc:  Oral  Oral  Resp:   22 15  Height:      Weight:    60.7 kg (133 lb 13.1 oz)  SpO2: 100% 96% 97% 95%   Weight change:   Intake/Output Summary (Last 24 hours) at 06/03/12 1610 Last data filed at 06/03/12 0600  Gross per 24 hour  Intake   2530 ml  Output   3550 ml  Net  -1020 ml   No exam performed today, patient refused exam. Lab Results: Basic Metabolic Panel:  Lab 06/03/12 9604 06/02/12 0500 05/31/12 0455 05/30/12 0540  NA 136 139 -- --  K 4.8 3.3* -- --  CL 107 108 -- --  CO2 24 24 -- --  GLUCOSE 85 379* -- --  BUN 4* 3* -- --  CREATININE 0.40* 0.41* -- --  CALCIUM 8.1* 7.9* -- --  MG -- -- 2.1 2.5  PHOS -- -- 2.3 2.3    Micro Results: Recent Results (from the past 240 hour(s))  URINE CULTURE     Status: Normal   Collection Time   05/28/12 12:32 PM      Component Value Range Status Comment   Specimen Description URINE, RANDOM   Final    Special Requests NONE   Final    Culture  Setup Time 05/28/2012 17:51   Final    Colony Count 85,000 COLONIES/ML   Final    Culture PROTEUS MIRABILIS   Final    Report Status 05/31/2012 FINAL   Final    Organism ID, Bacteria PROTEUS MIRABILIS   Final   MRSA PCR SCREENING     Status: Normal   Collection Time   05/28/12  4:51 PM      Component Value Range Status Comment   MRSA by PCR NEGATIVE  NEGATIVE Final   CULTURE, BLOOD (ROUTINE X 2)     Status: Normal   Collection Time   05/28/12  9:12 PM   Component Value Range Status Comment   Specimen Description BLOOD RIGHT HAND   Final    Special Requests BOTTLES DRAWN AEROBIC ONLY   Final    Culture  Setup Time 05/29/2012 01:37   Final    Culture     Final    Value: STAPHYLOCOCCUS SPECIES (COAGULASE NEGATIVE)     Note: THE SIGNIFICANCE OF ISOLATING THIS ORGANISM FROM A SINGLE SET OF BLOOD CULTURES WHEN MULTIPLE SETS ARE DRAWN IS UNCERTAIN. PLEASE NOTIFY THE MICROBIOLOGY DEPARTMENT WITHIN ONE WEEK IF SPECIATION AND SENSITIVITIES ARE REQUIRED.     DIPHTHEROIDS(CORYNEBACTERIUM SPECIES)     Note: Standardized susceptibility testing for this organism is not available.     29 Note: Gram Stain Report Called to,Read Back By and Verified With: PAT STRAMOSKI AT 6:22PM 8 13  BY THOMI   Report Status 05/31/2012 FINAL   Final   CULTURE, BLOOD (ROUTINE X 2)     Status: Normal (Preliminary result)  Collection Time   05/28/12  9:16 PM      Component Value Range Status Comment   Specimen Description BLOOD RIGHT ARM   Final    Special Requests BOTTLES DRAWN AEROBIC ONLY   Final    Culture  Setup Time 05/29/2012 01:37   Final    Culture     Final    Value:        BLOOD CULTURE RECEIVED NO GROWTH TO DATE CULTURE WILL BE HELD FOR 5 DAYS BEFORE ISSUING A FINAL NEGATIVE REPORT   Report Status PENDING   Incomplete    Studies/Results: No results found. Medications: I have reviewed the patient's current medications. Scheduled Meds:   . antiseptic oral rinse  15 mL Mouth Rinse QID  . cefTRIAXone (ROCEPHIN)  IV  1 g Intravenous Q24H  . chlorhexidine  15 mL Mouth/Throat BID  . insulin aspart  0-9 Units Subcutaneous TID WC  . levothyroxine  12.5 mcg Intravenous Daily  . potassium chloride  20 mEq Oral BID  . sodium chloride  10 mL Intravenous Q12H  . sodium chloride  10-40 mL Intracatheter Q12H  . DISCONTD: potassium chloride  20 mEq Oral BID   Continuous Infusions:   . 0.45 % NaCl with KCl 20 mEq / L 100 mL/hr at 06/03/12 0100  . DISCONTD:  sodium chloride 100 mL/hr at 06/02/12 1610   PRN Meds:.acetaminophen, sodium chloride  Assessment/Plan: 78 y.o female PMH dementia, polyclonal gammopathy, hypothyroidism, thrush, depression, decubitus ulcer prior to admission presents for altered mental status on 8/28 found to have hypernatremia, leukocytosis, elevated anion gap (16) with respiratory alkalosis, tachycardia, sacral decubitus stage 2 ulcer, stage 1 to ankle, FTT, 3/4 SIRS criteria on admission 8/28. Hypernatremia, leukocytosis, AG, tachycardia all resolved hospital day 6.   1. FTT (failure to thrive) - improving  -dysphagia 1 diet -associated with wt loss, decreased po  -prealbumin 9.7 (low), Albumin was low 3.0  -will monitor for re-feeding syndrome (Mg and Phos normal)  -K normalizing will continue to supplement  2. Altered mental status: stable  -multifactorial prob 2/2 baseline dementia, +Proteus UTI and 1/2 aerobic blood cxs showing gm +cocci in pairs and clusters which could be a contaminant completed 6 doses Rocephin  -GCS 11-12  -q4 neuro checks   3. +blood culture and Proteus UTI: sensitive to ceftriaxone, Day #6 -1/2 aerobic bottles with coag neg staph thus likely contaminant final Cx pending -completed today 06/03/12 for total of 6 dose rocephin  4.Sacral decubitus ulcer, stage II, ankle with stage 1  -wound nurse consult saw 8/29 with recs of foam dressing RN to continue wound care qd   5. Dementia  -unknown baseline per daughter pt did not recognize her at baseline  -holding Aricept currently   6.thyroid disorder  -h/o subclinical hypothyroidism  -TSH was 2.563 this admission  -holding home oral Synthyroid 25 mcg   7. Thrush  -continue oral care   8. Concerning pulmonary adenopathy  -previous CXR 04/2011 showed 3.8 mass like consolidation in the R mid lung  -previous CT 04/2011 concerning for infection with right upper lobe lesion  -CXR this admission with right suprahilar/paratracheal prominence,  worrisome for underlying adenopathy  -repeat CXR 8/30 neg  -pt doesn't meet SIRS criteria will not pursue further workup  9. History of acute on chronic b/l SDH  -noted at [facility] 11/2011 pt was in neuro ICU placed on Keppra 250 mg bid  -will review records further to see if pt needs repeat head CT  this admission and needs to be on Keppra   10. DVT Px  -scds given slight thrombocytopenia  11. F/E/N  -bmet q 12, will replace electrolytes prn (will replace K with10 meq x 4 runs)  -dsyphagia 1  12. Dispo  -social work consulted and feels pt can be d/c home with legal guardian (daughter   Code Status: DNR  Christen Bame, MD PGY-1 [phone]  Christen Bame 06/03/2012, 7:11 AM

## 2012-06-04 DIAGNOSIS — L8990 Pressure ulcer of unspecified site, unspecified stage: Secondary | ICD-10-CM

## 2012-06-04 DIAGNOSIS — L8992 Pressure ulcer of unspecified site, stage 2: Secondary | ICD-10-CM

## 2012-06-04 DIAGNOSIS — L89109 Pressure ulcer of unspecified part of back, unspecified stage: Secondary | ICD-10-CM

## 2012-06-04 LAB — CULTURE, BLOOD (ROUTINE X 2)

## 2012-06-04 LAB — BASIC METABOLIC PANEL
CO2: 24 mEq/L (ref 19–32)
Calcium: 7.1 mg/dL — ABNORMAL LOW (ref 8.4–10.5)
Creatinine, Ser: 0.42 mg/dL — ABNORMAL LOW (ref 0.50–1.10)
GFR calc Af Amer: >90 mL/min (ref >90)
Sodium: 143 mEq/L (ref 135–145)

## 2012-06-04 LAB — GLUCOSE, CAPILLARY
Glucose-Capillary: 90 mg/dL (ref 70–99)
Glucose-Capillary: 102 mg/dL — ABNORMAL HIGH (ref 70–99)

## 2012-06-04 MED ORDER — FLUCONAZOLE 150 MG PO TABS: 150.0000 mg | ORAL_TABLET | Freq: Once | ORAL | Status: DC

## 2012-06-04 NOTE — Clinical Social Work Placement (Addendum)
    Clinical Social Work Department CLINICAL SOCIAL WORK PLACEMENT NOTE 06/04/2012  Patient:  Kristi Webb, Kristi Webb  Account Number:  [id number] Admit date:  05/28/2012  Clinical Social Worker:  Doree Albee  Date/time:  06/03/2012 05:30 PM  Clinical Social Work is seeking post-discharge placement for this patient at the following level of care:   SKILLED NURSING   (*CSW will update this form in Epic as items are completed)   06/03/2012  Patient/family provided with [facility] Department of Clinical Social Work's list of facilities offering this level of care within the geographic area requested by the patient (or if unable, by the patient's family).  06/03/2012  Patient/family informed of their freedom to choose among providers that offer the needed level of care, that participate in Medicare, Medicaid or managed care program needed by the patient, have an available bed and are willing to accept the patient.  06/03/2012  Patient/family informed of [facility]' ownership interest in [facility], as well as of the fact that they are under no obligation to receive care at this facility.  PASARR submitted to EDS on 06/03/2012 PASARR number received from EDS on 06/03/2012  FL2 transmitted to all facilities in geographic area requested by pt/family on  06/04/2012 FL2 transmitted to all facilities within larger geographic area on   Patient informed that his/her managed care company has contracts with or will negotiate with  certain facilities, including the following:     Patient/family informed of bed offers received:   Patient chooses bed at [facility] Physician recommends and patient chooses bed at    Patient to be transferred to [facility]  on  06/04/12 Patient to be transferred to facility by   The following physician request were entered in Epic:   Additional Comments:   Jodean Lima, [phone]

## 2012-06-04 NOTE — Progress Notes (Signed)
Medical Student Daily Progress Note  Subjective: 24 Hour Events : No acute events overnight. [name] continues to show marked improvement. She has been out of bed sitting up in the chair and is responsive to questions again this morning. Patient denies any pain. Doing well.   Objective: Vital signs in last 24 hours: Filed Vitals:   06/04/12 0000 06/04/12 0400 06/04/12 0403 06/04/12 0800  BP: 104/61 112/47  107/59  Pulse:    69  Temp:   97.4 F (36.3 C) 98 F (36.7 C)  TempSrc:   Oral Oral  Resp: 18 14  14   Height:      Weight:      SpO2: 98% 94% 94% 95%    Intake/Output Summary (Last 24 hours) at 06/04/12 1058 Last data filed at 06/04/12 0800  Gross per 24 hour  Intake    930 ml  Output   1725 ml  Net   -795 ml   Physical Exam: Vitals reviewed.  General: sitting up in chair, NAD, pleasant HEENT: no scleral icterus  Cardiac: RRR, no rubs, murmurs or gallops  Pulm: clear to auscultation bilaterally  Abd: soft, nontender, nondistended  Ext: warm and well perfused, 1+ edema bilaterally. Left ankle ulcer with Mediplex bandage intact  Neuro: alert, responsive to questions    Lab Results: Basic Metabolic Panel:  Lab 06/04/12 1610 06/03/12 0430 05/31/12 0455 05/30/12 0540  NA 143 136 -- --  K 3.2* 4.8 -- --  CL 113* 107 -- --  CO2 24 24 -- --  GLUCOSE 78 85 -- --  BUN 5* 4* -- --  CREATININE 0.42* 0.40* -- --  CALCIUM 7.1* 8.1* -- --  MG -- -- 2.1 2.5  PHOS -- -- 2.3 2.3   Liver Function Tests:  Lab 05/30/12 0540 05/29/12 0625  AST 15 11  ALT 8 7  ALKPHOS 129* 158*  BILITOT 0.3 0.5  PROT 5.9* 7.2  ALBUMIN 2.3* 3.0*   CBC:  Lab 06/01/12 0535 05/31/12 0455  WBC 7.0 7.6  NEUTROABS 3.3 3.9  HGB 11.3* 11.9*  HCT 35.2* 37.9  MCV 82.2 83.8  PLT 118* 139*   CBG:  Lab 06/04/12 0759 06/03/12 2122 06/03/12 1631 06/03/12 1159 06/03/12 0844 06/02/12 2105  GLUCAP 90 120* 165* 105* 77 98   Hemoglobin A1C:  Lab 05/28/12 1702  HGBA1C 5.7*   Thyroid  Function Tests:  Lab 05/28/12 1702  TSH 2.563  T4TOTAL --  FREET4 --  T3FREE --  THYROIDAB --   Coagulation:  Lab 05/28/12 1702  LABPROT 17.3*  INR 1.39   Anemia Panel:  Lab 05/29/12 0956  VITAMINB12 1400*  FOLATE 8.0  FERRITIN 224  TIBC 153*  IRON 33*  RETICCTPCT 0.6   Urinalysis:  Lab 05/28/12 1231  COLORURINE YELLOW  LABSPEC 1.025  PHURINE 7.0  GLUCOSEU NEGATIVE  HGBUR NEGATIVE  BILIRUBINUR SMALL*  KETONESUR 15*  PROTEINUR 30*  UROBILINOGEN 1.0  NITRITE NEGATIVE  LEUKOCYTESUR LARGE*    Micro Results: Recent Results (from the past 240 hour(s))  URINE CULTURE     Status: Normal   Collection Time   05/28/12 12:32 PM      Component Value Range Status Comment   Specimen Description URINE, RANDOM   Final    Special Requests NONE   Final    Culture  Setup Time 05/28/2012 17:51   Final    Colony Count 85,000 COLONIES/ML   Final    Culture PROTEUS MIRABILIS   Final  Report Status 05/31/2012 FINAL   Final    Organism ID, Bacteria PROTEUS MIRABILIS   Final   MRSA PCR SCREENING     Status: Normal   Collection Time   05/28/12  4:51 PM      Component Value Range Status Comment   MRSA by PCR NEGATIVE  NEGATIVE Final   CULTURE, BLOOD (ROUTINE X 2)     Status: Normal   Collection Time   05/28/12  9:12 PM      Component Value Range Status Comment   Specimen Description BLOOD RIGHT HAND   Final    Special Requests BOTTLES DRAWN AEROBIC ONLY   Final    Culture  Setup Time 05/29/2012 01:37   Final    Culture     Final    Value: STAPHYLOCOCCUS SPECIES (COAGULASE NEGATIVE)     Note: THE SIGNIFICANCE OF ISOLATING THIS ORGANISM FROM A SINGLE SET OF BLOOD CULTURES WHEN MULTIPLE SETS ARE DRAWN IS UNCERTAIN. PLEASE NOTIFY THE MICROBIOLOGY DEPARTMENT WITHIN ONE WEEK IF SPECIATION AND SENSITIVITIES ARE REQUIRED.     DIPHTHEROIDS(CORYNEBACTERIUM SPECIES)     Note: Standardized susceptibility testing for this organism is not available.     29 Note: Gram Stain Report  Called to,Read Back By and Verified With: PAT STRAMOSKI AT 6:22PM 8 13  BY THOMI   Report Status 05/31/2012 FINAL   Final   CULTURE, BLOOD (ROUTINE X 2)     Status: Normal   Collection Time   05/28/12  9:16 PM      Component Value Range Status Comment   Specimen Description BLOOD RIGHT ARM   Final    Special Requests BOTTLES DRAWN AEROBIC ONLY   Final    Culture  Setup Time 05/29/2012 01:37   Final    Culture NO GROWTH 5 DAYS   Final    Report Status 06/04/2012 FINAL   Final    Medications: I have reviewed the patient's current medications. Scheduled Meds:   . antiseptic oral rinse  15 mL Mouth Rinse BID PC  . feeding supplement  237 mL Oral BID BM  . fluconazole  150 mg Oral Once  . levothyroxine  12.5 mcg Intravenous Daily  . potassium chloride  20 mEq Oral BID  . sodium chloride  10 mL Intravenous Q12H  . sodium chloride  10-40 mL Intracatheter Q12H  . DISCONTD: antiseptic oral rinse  15 mL Mouth Rinse QID  . DISCONTD: chlorhexidine  15 mL Mouth/Throat BID  . DISCONTD: insulin aspart  0-9 Units Subcutaneous TID WC   Continuous Infusions:   . DISCONTD: 0.45 % NaCl with KCl 20 mEq / L Stopped (06/03/12 1130)   PRN Meds:.DISCONTD: acetaminophen, DISCONTD: sodium chloride  Assessment/Plan: Kristi Webb is a 78yo female with dementia who presented to the ED on 05/28/12 with acutely altered mental status. On admission she had significant hypernatremia at Na 174. She also has decubitus stage 2 sacral ulcer, stage 1 ankle ulcer, as well as oral thrush and failure to thrive. She has PMH of dementia and hypothyroidism.   1. Hypernatremia -resolved: 174-->155-->146--> 139-->136  -D/c'd IVF 06/03/12  -Modify bmet q4-->q12 06/03/12  -Replace electrolytes prn   2. Altered Mental Status -improved: GCS 6-->11-12-->14-15  -Likely 2/2 hypernatremia, baseline dementia, UTI +Proteus -GCS 14-15 this morning: verbally converses, intermittently oriented to name, follows commands, eyes  open -Consider CT head/neck, non-contrast  -UTI tx'd w/ Rocephin, last dose 06/03/12   3. Sacral Decubitus Ulcer, stage 2  -Mediplex in place  -  Keep clean and dry, avoid pressure to area as possible, frequent turns  -Wound care nurse c/s ordered air mattress, nothing further to do   4. Ankle Decubitus Ulcer, stage 1  -Mediplex in place  -Keep clean and dry, avoid pressure to area, frequent turns  -Wound care nurse c/s ordered air mattress, nothing further to do   5. Oral Thrush - improved  -Nystatin started 05/26/12 per PCP  -Fluconazole 150mg  1st dose 05/27/12 [per PCP, 2nd dose given 06/03/12 -Appears significantly improved  6. Failure to Thrive / Diet - weight trending up: 117-->121-->133lb  -Significant weight loss per daughter's report, 1-53yrs  -BMI 17.94 on admission, weight trending up with fluids  -Tolerating dysphagia 1 diet; PO intake 50-75% meals -Nutrition c/s: monitor Mg, K, and phos and replete as needed; patient is at risk for refeeding syndrome. -Hypokalemia improving; Mg and phos normal  -Nutrition c/s #2: Ensure Complete po BID (350 kcal, 13g protein), encourage protein food intake  7. Hypothyroidism - prior diagnosis  -Levothyroxine per PCP, last renewed 05/19/12, held this hospitalization  -TSH 2.563 this admission   8. Pulmonary Mass - noted previously -Previous CXR 7/12: 3.8 mass like consolidation in the R mid lung  -Previous CT 7/12: concerning for infection with R upper lobe lesion  -This admission CXR 05/28/12: R suprahilar/paratracheal prominence, worrisome for underlying adenopathy  -Repeat CXR 05/30/12: neg. Will not pursue further w/u; pt does not meet SIRS criteria   9. DVT Px  -scds   10. Dispo  -Admitted to stepdown unit  -Social work c/s, spiritual c/s  -Daughter wants to change agencies [facility] to [facility]  -Ready for d/c; likely today. Will d/c to short-term [facility], then will be living with legal guardian (daughter Kristi Webb) in  home  Code status: DNR/DNI    LOS: 7 days   This is a Psychologist, occupational Note.  The care of the patient was discussed with Dr. Burtis Junes and the assessment and plan formulated with their assistance.  Please see their attached note for official documentation of the daily encounter.  BURNS, MARY E 06/04/2012, 10:58 AM  I have read and reviewed the Medical Student's note and agree with what is written above. Please see my note for today.  Christen Bame [phone]

## 2012-06-04 NOTE — Progress Notes (Signed)
Per MD order, PICC line removed. Cath intact at 44cm. Vaseline pressure gauze to site, pressure held x . No bleeding to site. Erin RN encouraged to add to the patients d/c instructions to :keep dressing CDI x 24 hours. if bleeding occurs hold pressure, if bleeding does not stop contact MD or go to the ED.  Consuello Masse

## 2012-06-04 NOTE — Progress Notes (Signed)
Offered support to family as they face moving pt to a new facility, then home with a family member as caregiver.  Offered prayer per their request.

## 2012-06-04 NOTE — Progress Notes (Signed)
Internal Medicine Attending  Date: 06/04/2012  Patient name: Kristi Webb Medical record number: [id number] Date of birth: 1931-11-11 Age: 78 y.o. Gender: female  I saw and evaluated the patient. I reviewed the resident's note by Dr. Burtis Junes and I agree with the resident's findings and plans as documented in her progress note.  [name] is being discharged to a short term SNF prior to returning to her daughters home for further care.  Agree with transfer.

## 2012-06-04 NOTE — Progress Notes (Signed)
Clinical Social Work-CSW compiled d/c packet with FL2/d/c summary/AVS-pt daughter completing admissions paperwork at [facility] and CSW will arrange transportation when dtr returns- No further CSW needs- Jodean Lima, [phone]

## 2012-06-04 NOTE — Progress Notes (Signed)
Pt. Discharged to [facility] care per EMS.

## 2012-06-04 NOTE — Progress Notes (Signed)
I have reviewed and discussed note with student. Please see my note as well. Christen Bame, MD PGY-1

## 2012-06-04 NOTE — Progress Notes (Signed)
Physical Therapy Treatment Patient Details Name: Kristi Webb MRN: [id number] DOB: 02-18-1932 Today's Date: 06/04/2012 Time: 4098-1191 PT Time Calculation (min): 13 min  PT Assessment / Plan / Recommendation Comments on Treatment Session  Patient s/p hypernatremia with decr mobility secondary to decr endurance and poor cognition.  Will benefit from trial of PT to assess if patient can improve in her status.  Unsure if patient will progress given her postural and cognitive deficits.    Follow Up Recommendations  Skilled nursing facility;Supervision/Assistance - 24 hour    Barriers to Discharge        Equipment Recommendations  Defer to next venue    Recommendations for Other Services    Frequency Min 2X/week   Plan Discharge plan remains appropriate;Frequency remains appropriate    Precautions / Restrictions Precautions Precautions: Fall Restrictions Weight Bearing Restrictions: No   Pertinent Vitals/Pain VSS, No pain    Mobility  Bed Mobility Bed Mobility: Rolling Right;Right Sidelying to Sit;Sitting - Scoot to Edge of Bed Rolling Right: With rail;2: Max assist Right Sidelying to Sit: With rails;HOB elevated;2: Max assist Sitting - Scoot to Edge of Bed: 2: Max assist;With rail Details for Bed Mobility Assistance: Assisted patient to EOB with max encouragement.  Patient grabbed hold of the rail again with hands.  PT was able to encourage patient to allow PT to assist patient to recliner.  Used pad to assist with squat pivot transfer with patient only weight bearing but did not assist otherwise.   Transfers Transfers: Scientist, clinical (histocompatibility and immunogenetics) Transfers Sit to Stand: Not tested (comment) Stand to Sit: Not tested (comment) Stand Pivot Transfers: Not tested (comment) Squat Pivot Transfers: 1: +1 Total assist;With upper extremity assistance (pt= 20%) Details for Transfer Assistance: Used pad to assist with squat pivot transfer.  Patient continues to bear a little weight on right LE but none  on left LE.  Patient leans to right and needs extensive assist to sit or stand partially upright.  Ambulation/Gait Ambulation/Gait Assistance: Not tested (comment) Stairs: No Wheelchair Mobility Wheelchair Mobility: No    PT Goals Acute Rehab PT Goals PT Goal: Supine/Side to Sit - Progress: Progressing toward goal PT Goal: Sit at [facility] Of Bed - Progress: Progressing toward goal PT Transfer Goal: Bed to Chair/Chair to Bed - Progress: Progressing toward goal  Visit Information  Last PT Received On: 06/04/12 Assistance Needed: +2    Subjective Data  Subjective: Patient not talking, shook head to yes no questions   Cognition  Overall Cognitive Status: History of cognitive impairments - at baseline Area of Impairment: Attention;Memory;Following commands;Safety/judgement;Awareness of errors;Awareness of deficits;Problem solving Arousal/Alertness: Awake/alert Orientation Level: Disoriented to;Place;Time;Situation Behavior During Session: Anxious Current Attention Level: Focused Following Commands: Follows one step commands inconsistently;Follows one step commands with increased time Safety/Judgement: Decreased safety judgement for tasks assessed;Decreased awareness of safety precautions Awareness of Errors: Assistance required to identify errors made;Assistance required to correct errors made    Balance  Static Sitting Balance Static Sitting - Balance Support: Bilateral upper extremity supported;Feet supported Static Sitting - Level of Assistance: 2: Max assist Static Sitting - Comment/# of Minutes: 5 minutes.  Still needs max assist and cues.    End of Session PT - End of Session Equipment Utilized During Treatment: Gait belt Activity Tolerance: Patient limited by fatigue Patient left: in chair;with call bell/phone within reach Nurse Communication: Mobility status;Need for lift equipment       INGOLD,Angeleigh Chiasson 06/04/2012, 1:46 PM  [facility] Acute  Rehabilitation [phone] (pager)

## 2012-06-04 NOTE — Progress Notes (Signed)
Clinical Social Work-CSW spoke with dtr Vickie re: disposition to SNF-CSW provided preliminary bed offers and contacted [facility] which is most likely SNF of choice- CSW following for disposition and will facilitate d/c Treatment team notified- Jodean Lima, [phone]
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
## 2012-07-06 ENCOUNTER — Emergency Department (HOSPITAL_COMMUNITY): Payer: Medicare Other

## 2012-07-06 ENCOUNTER — Observation Stay (HOSPITAL_COMMUNITY)
Admission: EM | Admit: 2012-07-06 | Discharge: 2012-07-07 | Disposition: A | Discharge status: Home / Self Care | Payer: Medicare Other | Attending: Family Medicine | Admitting: Family Medicine

## 2012-07-06 ENCOUNTER — Encounter (HOSPITAL_COMMUNITY): Payer: Self-pay | Admitting: *Deleted

## 2012-07-06 DIAGNOSIS — J690 Pneumonitis due to inhalation of food and vomit: Secondary | ICD-10-CM

## 2012-07-06 DIAGNOSIS — Z8673 Personal history of transient ischemic attack (TIA), and cerebral infarction without residual deficits: Secondary | ICD-10-CM | POA: Insufficient documentation

## 2012-07-06 DIAGNOSIS — G9389 Other specified disorders of brain: Secondary | ICD-10-CM | POA: Insufficient documentation

## 2012-07-06 DIAGNOSIS — E079 Disorder of thyroid, unspecified: Secondary | ICD-10-CM | POA: Diagnosis present

## 2012-07-06 DIAGNOSIS — R51 Headache: Secondary | ICD-10-CM | POA: Insufficient documentation

## 2012-07-06 DIAGNOSIS — E46 Unspecified protein-calorie malnutrition: Secondary | ICD-10-CM | POA: Diagnosis present

## 2012-07-06 DIAGNOSIS — R4182 Altered mental status, unspecified: Secondary | ICD-10-CM | POA: Insufficient documentation

## 2012-07-06 DIAGNOSIS — Z79899 Other long term (current) drug therapy: Secondary | ICD-10-CM | POA: Insufficient documentation

## 2012-07-06 DIAGNOSIS — F0390 Unspecified dementia without behavioral disturbance: Secondary | ICD-10-CM | POA: Insufficient documentation

## 2012-07-06 DIAGNOSIS — R0789 Other chest pain: Secondary | ICD-10-CM | POA: Insufficient documentation

## 2012-07-06 LAB — URINALYSIS, ROUTINE W REFLEX MICROSCOPIC
Glucose, UA: NEGATIVE mg/dL
Ketones, ur: NEGATIVE mg/dL
pH: 5.5 (ref 5.0–8.0)

## 2012-07-06 LAB — COMPREHENSIVE METABOLIC PANEL
AST: 28 U/L (ref 0–37)
Albumin: 2.8 g/dL — ABNORMAL LOW (ref 3.5–5.2)
Alkaline Phosphatase: 102 U/L (ref 39–117)
BUN: 11 mg/dL (ref 6–23)
Chloride: 106 mEq/L (ref 96–112)
Potassium: 4.4 mEq/L (ref 3.5–5.1)
Sodium: 141 mEq/L (ref 135–145)
Total Bilirubin: 0.2 mg/dL — ABNORMAL LOW (ref 0.3–1.2)
Total Protein: 6.6 g/dL (ref 6.0–8.3)

## 2012-07-06 LAB — URINE MICROSCOPIC-ADD ON

## 2012-07-06 LAB — CBC WITH DIFFERENTIAL/PLATELET
Basophils Absolute: 0 10*3/uL (ref 0.0–0.1)
Basophils Relative: 0 % (ref 0–1)
Eosinophils Absolute: 0 10*3/uL (ref 0.0–0.7)
MCH: 27.1 pg (ref 26.0–34.0)
MCHC: 33.2 g/dL (ref 30.0–36.0)
Neutro Abs: 3 10*3/uL (ref 1.7–7.7)
Neutrophils Relative %: 53 % (ref 43–77)
Platelets: 254 10*3/uL (ref 150–400)
RDW: 17.6 % — ABNORMAL HIGH (ref 11.5–15.5)

## 2012-07-06 MED ORDER — POTASSIUM CHLORIDE ER 10 MEQ PO TBCR
10.0000 meq | EXTENDED_RELEASE_TABLET | Freq: Two times a day (BID) | ORAL | Status: DC
  Administered 2012-07-06 – 2012-07-07 (×2): 10 meq via ORAL
  Filled 2012-07-06 (×3): qty 1

## 2012-07-06 MED ORDER — ENOXAPARIN SODIUM 40 MG/0.4ML ~~LOC~~ SOLN
40.0000 mg | SUBCUTANEOUS | Status: DC
  Filled 2012-07-06 (×2): qty 0.4

## 2012-07-06 MED ORDER — ADULT MULTIVITAMIN W/MINERALS CH
1.0000 | ORAL_TABLET | Freq: Every day | ORAL | Status: DC
  Filled 2012-07-06 (×2): qty 1

## 2012-07-06 MED ORDER — SODIUM CHLORIDE 0.9 % IV BOLUS (SEPSIS)
500.0000 mL | Freq: Once | INTRAVENOUS | Status: AC
  Administered 2012-07-06: 500 mL via INTRAVENOUS

## 2012-07-06 MED ORDER — CLINDAMYCIN PHOSPHATE 900 MG/50ML IV SOLN
900.0000 mg | Freq: Once | INTRAVENOUS | Status: AC
  Administered 2012-07-06: 900 mg via INTRAVENOUS
  Filled 2012-07-06: qty 50

## 2012-07-06 MED ORDER — MIRTAZAPINE 7.5 MG PO TABS
7.5000 mg | ORAL_TABLET | Freq: Every day | ORAL | Status: DC
  Administered 2012-07-06: 7.5 mg via ORAL
  Filled 2012-07-06 (×2): qty 1

## 2012-07-06 MED ORDER — DONEPEZIL HCL 10 MG PO TABS
10.0000 mg | ORAL_TABLET | Freq: Every morning | ORAL | Status: DC
  Administered 2012-07-07: 10 mg via ORAL
  Filled 2012-07-06: qty 1

## 2012-07-06 MED ORDER — LEVOTHYROXINE SODIUM 25 MCG PO TABS
25.0000 ug | ORAL_TABLET | Freq: Every day | ORAL | Status: DC
  Administered 2012-07-07: 25 ug via ORAL
  Filled 2012-07-06 (×2): qty 1

## 2012-07-06 NOTE — ED Provider Notes (Signed)
Medical screening examination/treatment/procedure(s) were conducted as a shared visit with non-physician practitioner(s) and myself.  I personally evaluated the patient during the encounter  The patient sounds as though she may be having recurrent aspiration events.  There is evidence of aspiration pneumonia/pneumonitis on chest x-ray today.  She's not hypoxic.  It's unclear what her recent swallowing study demonstrated that my concern would be that she needs to probably be using thickened liquids as well.  I think the patient will need to be admitted the hospital for IV antibiotics and observation as her aspiration event that produce significant choking occurred today and therefore be concerned that her chest x-ray may look significantly worse tomorrow.  She may develop hypoxia in the short-term.  She also probably needs repeat swallow study to further evaluate her food and liquid needs and the possibility of gastrostomy tube needs to be considered of aspiration continues.  Lyanne Co, MD 07/06/12 [phone]

## 2012-07-06 NOTE — ED Notes (Signed)
Per ems pt recently moved back in with family as her caregiver in last 2 weeks. Family reports pt was last seen normal at 1030. Family reports pt was drooling. Family reports normally pt communicates. At 1030 pt had eyes closed, drooling, and will not communicate. Equal grip strength. cbg 84. Family reports pt has not had bowel movement since yesterday. Family is on way to ED.   Upon assessment pt will nod yes and no to answer questions,but refuses to speak.

## 2012-07-06 NOTE — ED Notes (Signed)
hospitalist at bedside

## 2012-07-06 NOTE — ED Notes (Addendum)
First attempt to call report. rn received pt back from surgery. rn will call back.

## 2012-07-06 NOTE — ED Provider Notes (Signed)
Medical screening examination/treatment/procedure(s) were conducted as a shared visit with non-physician practitioner(s) and myself.  I personally evaluated the patient during the encounter  Please see my other note for details  Lyanne Co, MD 07/06/12 [phone]

## 2012-07-06 NOTE — ED Notes (Signed)
md at bedside. Family at bedside. Pt now talking

## 2012-07-06 NOTE — ED Provider Notes (Signed)
History     CSN: [id number]  Arrival date & time 07/06/12  1122   First MD Initiated Contact with Patient 07/06/12 1132      Chief Complaint  Patient presents with  . Altered Mental Status    (Consider location/radiation/quality/duration/timing/severity/associated sxs/prior treatment) The history is provided by the patient, the EMS personnel and a relative.    78 y.o. female brought in by EMS because she stopped talking abruptly approximately one hour ago. Patient's past medical history significant for dementia, failure to thrive, and subdural hematoma. Level caveat secondary to dementia and altered mental status.   As per family, patient was eating pured meal (patient has history of aspiration pneumonia) daughter gave her water and she proceeded to cough took for a moment turn red and that is why EMS was called. Water is not thickened. As per daughter she is significantly improved at this time.  [name]   Past Medical History  Diagnosis Date  . Dementia     worse since 2012  . Thrush   . Thyroid disease   . FTT (failure to thrive) in adult   . 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 SDH (subdural hematoma)     b/l 11/2011 at [facility] placed on keppra 250 mg bid     Past Surgical History  Procedure Date  . Other surgical history 04/21/2011    left hip internal fixation and reduction intertrochanteric and femoral neck  . Other surgical history ~2 yrs ago    brain surgery    Family History  Problem Relation Age of Onset  . Hypertension      daughter 1(living)  . Suicidality      daughter 2    History  Substance Use Topics  . Smoking status: Former Smoker -- 1.0 packs/day for 40 years    Types: Cigarettes  . Smokeless tobacco: Never Used  . Alcohol Use: No    OB History    Grav Para Term Preterm Abortions TAB SAB Ect Mult Living                  Review of Systems  Unable to perform ROS: Dementia    Allergies  Review of  patient's allergies indicates no known allergies.  Home Medications   Current Outpatient Rx  Name Route Sig Dispense Refill  . ACETAMINOPHEN 500 MG PO TABS Oral Take 1,000 mg by mouth every 6 (six) hours as needed. For pain    . DONEPEZIL HCL 10 MG PO TABS Oral Take 10 mg by mouth daily.    Marland Kitchen LEVOTHYROXINE SODIUM 25 MCG PO TABS Oral Take 25 mcg by mouth daily.    Marland Kitchen MIRTAZAPINE 15 MG PO TABS Oral Take 15 mg by mouth at bedtime as needed. For depression    . NYSTATIN 100000 UNIT/ML MT SUSP Oral Take 500,000 Units by mouth 4 (four) times daily.    Marland Kitchen POTASSIUM CHLORIDE ER 10 MEQ PO TBCR Oral Take 10 mEq by mouth 2 (two) times daily.      BP 135/76  Pulse 78  Temp 99.1 F (37.3 C) (Oral)  Resp 18  SpO2 97%  Physical Exam  Nursing note and vitals reviewed. Constitutional: She appears well-developed and well-nourished. No distress.  HENT:  Head: Normocephalic and atraumatic.  Mouth/Throat: Oropharynx is clear and moist.  Eyes: Conjunctivae normal and EOM are normal. Pupils are equal, round, and reactive to light.  Neck: Normal range of motion.  Cardiovascular: Normal rate, regular rhythm and normal heart sounds.   Pulmonary/Chest: Effort normal and breath sounds normal. No stridor. No respiratory distress. She has no wheezes. She has no rales. She exhibits no tenderness.  Abdominal: Soft. Bowel sounds are normal. She exhibits no distension and no mass. There is no tenderness. There is no rebound and no guarding.  Musculoskeletal: Normal range of motion.  Neurological:       Nonverbal, but alert. Patient is able to follow simple commands. Patient has equal strength to the upper diminished at 4/5, LE strength 3/5  Skin: Skin is warm.  Psychiatric: She has a normal mood and affect.    ED Course  Procedures (including critical care time)  Labs Reviewed  CBC WITH DIFFERENTIAL - Abnormal; Notable for the following:    RDW 17.6 (*)     All other components within normal limits    COMPREHENSIVE METABOLIC PANEL - Abnormal; Notable for the following:    Creatinine, Ser 0.43 (*)     Albumin 2.8 (*)     Total Bilirubin 0.2 (*)     All other components within normal limits  URINALYSIS, ROUTINE W REFLEX MICROSCOPIC - Abnormal; Notable for the following:    APPearance CLOUDY (*)     Hgb urine dipstick MODERATE (*)     Nitrite POSITIVE (*)     Leukocytes, UA LARGE (*)     All other components within normal limits  URINE MICROSCOPIC-ADD ON - Abnormal; Notable for the following:    Squamous Epithelial / LPF FEW (*)     Bacteria, UA MANY (*)     All other components within normal limits   Dg Chest 2 View  07/06/2012  *RADIOLOGY REPORT*  Clinical Data: Chest pain.  Failure to thrive.  CHEST - 2 VIEW  Comparison: 05/30/2012  Findings: Cardiac size is normal.  There is right upper lobe density, most consistent with infiltrate.  No evidence for pulmonary edema.  Shallow inflation.  IMPRESSION: Suspect right upper lobe infiltrate.  Follow-up is recommended to document clearing.   Original Report Authenticated By: Patterson Hammersmith, M.D.    Ct Head Wo Contrast  07/06/2012  *RADIOLOGY REPORT*  Clinical Data: Headache.  Altered behavior.  CT HEAD WITHOUT CONTRAST  Technique:  Contiguous axial images were obtained from the base of the skull through the vertex without contrast.  Comparison: 04/21/2011  Findings: Ventriculomegaly noted with prominent temporal horns and flattening of the head of the caudate nuclei.  White matter hypodensity is present and may reflect chronic ischemic microvascular white matter disease although transependymal CSF blockage cannot be excluded.  Subacute appearing right-sided subdural hematoma node along the frontal and parietal lobes, measuring up to 5 mm in thickness along the right frontal lobe.  There is about 2 mm of leftward shift of the anterior falx. There is also a small left subdural hematoma along the frontal and parietal lobes, up to about 4 mm in  thickness.  Interval right craniotomy noted.  There also appears to have been a small left interval craniotomy.  Increase in prominence of low density left frontal extra-axial fluid could represent a chronic subdural hematoma remote left frontal lobe infarct suspected. Moderate prominence of the third ventricle.  No mass lesion or acute CVA observed.  IMPRESSION:  1.  Interval bilateral craniotomies, with small bilateral subdural hematomas.  Right-sided subdural hematoma has subacute components; the left has a slightly more chronic appearance. 2.  Increased ventriculomegaly compared the prior, with flattening of the  caudate nucleus heads and increase temporal horn dilatation. There is also white matter hypodensity more prominent in the frontal lobes, potentially from transit minimal CSF spread and / or chronic microvascular white matter disease.  The appearance is suspicious for mild hydrocephalus, with normal pressure hydrocephalus not excluded. 3.  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   Original Report Authenticated By: Dellia Cloud, M.D.      Date: 07/06/2012  Rate: 73  Rhythm: normal sinus rhythm  QRS Axis: normal  Intervals: normal  ST/T Wave abnormalities: nonspecific ST/T changes  Conduction Disutrbances:none  Narrative Interpretation:   Old EKG Reviewed: unchanged   1. Aspiration pneumonia       MDM  Chest x-ray shows infiltrate in the right upper lobe. Patient will be admitted for aspiration pneumonia.  Head CT may indicate show normal pressure hydrocephalus. Patient is non-ambulatory at baseline and wears a diaper.  Consultation from hospitalist Dr. Mahala Menghini appreciated: He states that he will come to evaluate the patient.   Feel patient needs to be admitted for observation and formal swallow study to evaluate why she is having repeated episodes of aspiration. Likely that infiltrate seen today was not from episode this morning but her from prior episodes.  Patient will be started on IV clindamycin.  Patient will be admitted for observation under care of Dr. Mahala Menghini.   Wynetta Emery, PA-C 07/06/12 [phone]

## 2012-07-06 NOTE — ED Notes (Signed)
Report given to tim,rn

## 2012-07-06 NOTE — ED Notes (Signed)
Pt to CT. Caregiver refused to let pt go to CT unless she was allowed to accompany

## 2012-07-06 NOTE — ED Notes (Signed)
[id number]<AV> Expected date:07/06/12<BR> Expected time:11:18 AM<BR> Means of arrival:<BR> Comments:<BR> EMS

## 2012-07-06 NOTE — H&P (Addendum)
[facility] Medical Consultation  Kristi Webb WUJ:[id number] DOB: 23-Jul-1932 DOA: 07/06/2012 PCP: Kristi Oats, MD   Requesting physician: Kristi Webb ED Date of consultation: 07/06/12 Reason for consultation: Aspiration Pneumonitis  Impression/Recommendations Principal Problem:  *Aspiration pneumonitis Active Problems:  Altered mental status  Dementia  Thyroid disorder  Malnutrition  patient aspirated on thin liquids-I ran this chest x-ray by another independent physician Dr. Luciana Webb of Infectious disease and asked him to opinion regarding prophylaxis for chemical pneumonitis and he states that patient does not need any further antibiotics or treatment. I discussed this with family and daughter was very scared about taking her home given this has never happened before. I discussed with the emergency room physician and we will admit the patient as observation overnight to insure no further issues occur-I. will get speech language pathology to evaluate her for aspiration tomorrow as I believe she may silently be aspirating despite being consistently supervised by her daughter at all times with feeds and drinking.  We will continue all of her home meds and reassess in the morning-she potentially can be discharged tomorrow if she tolerates pured diet and get speech next pathologist input  Her Ct scan of the head was performed in the Ed shows some ? Of Normal pressure hydrocephalus-at baseline the patient is bed-bound, and in the recent past has sustained a subdural hematoma-hydrocephalus may be a result of this, and I will continue discussions with patient's gaurdian and daughter regarding goals of care as i believe ultimately her prognosis is gaurded  Chief Complaint: Cough and episode of chocking this am  HPI:  78 yr old lady with multiple medcal illnesses presented to ED today due to a bout of coughin where she turned red in the face , she was slobering out of her mouth-this was  witnessed by the daughter Kristi Webb who is the [place].  When the EMT's came she was checked out-at first she wouldn't open her mouth and wouldn't cooperate with themm.  She seemed to come back to her normal self.   She normally at [place] is bedridden- is total care-she can colour by herself and play with toys.  She can feed herself at baseline-her daughter relates that she had condition for guardianship of her mother because she was living with someone else in the remote past.  By the daughter got here and was seen the patient she was back to her normal self-which is confusion and demented Was recently d/c from [facility] after last hospital stay about 2 weeks ago and this seemed to be doing fairly. Daughter reports the patient had been seen by speech therapy in the hospital and nursing home and initially had a thickened fluids however these were later changed to regular thin fluids and she has not had any further issues with regards to aspiration in the past however states that she is very concerned about the events of this morning.  Review of Systems:  Noncontributory-patient is apparently very reticent and will not talk unless she wants to talk. She also carries him significant dementia diagnosis and I cannot obtain a meaningful review of systems The daughter is the main historian in the patient's care  Past Medical History  Diagnosis Date  . Dementia     worse since 2012  . Thrush   . Thyroid disease   . FTT (failure to thrive) in adult   . 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 SDH (subdural hematoma)     b/l 11/2011 at [facility] placed  on keppra 250 mg bid    Chart review  Admission 06/28/12 for altered mental status (known subdural hematomas), FTT  Admission for L intertrochanteric and femoral #'s s/p ORIF   Past Surgical History  Procedure Date  . Other surgical history 04/21/2011    left hip internal fixation and reduction intertrochanteric and femoral neck  . Other surgical  history ~2 yrs ago    brain surgery   Social History:  reports that she has quit smoking. Her smoking use included Cigarettes. She has a 40 pack-year smoking history. She has never used smokeless tobacco. She reports that she does not drink alcohol or use illicit drugs.  No Known Allergies Family History  Problem Relation Age of Onset  . Hypertension      daughter 1(living)  . Suicidality      daughter 2    Prior to Admission medications   Medication Sig Start Date End Date Taking? Authorizing Provider  donepezil (ARICEPT) 10 MG tablet Take 10 mg by mouth every morning.    Yes Historical Provider, MD  levothyroxine (SYNTHROID, LEVOTHROID) 25 MCG tablet Take 25 mcg by mouth every morning.    Yes Historical Provider, MD  mirtazapine (REMERON) 15 MG tablet Take 7.5 mg by mouth at bedtime as needed. For depression   Yes Historical Provider, MD  Multiple Vitamin (MULTIVITAMIN WITH MINERALS) TABS Take 1 tablet by mouth every evening.   Yes Historical Provider, MD  potassium chloride (K-DUR) 10 MEQ tablet Take 10 mEq by mouth 2 (two) times daily.   Yes Historical Provider, MD  vitamin C (ASCORBIC ACID) 500 MG tablet Take 500 mg by mouth every evening.   Yes Historical Provider, MD   Physical Exam: Blood pressure 135/76, pulse 78, temperature 99.1 F (37.3 C), temperature source Oral, resp. rate 18, SpO2 97.00%. Filed Vitals:   07/06/12 1129  BP: 135/76  Pulse: 78  Temp: 99.1 F (37.3 C)  TempSrc: Oral  Resp: 18  SpO2: 97%     General:  Alert but disoriented African American female-closing her eyes tightly when I attempt to examine her not allow me to look into her mouth  Eyes: Not examined  ENT: Soft supple no masses  Neck: Soft  Cardiovascular: S1-S2 no murmur rub or gallop regular rate rhythm  Respiratory: Clear to posterior lung fields  Abdomen: Soft nontender nondistended  Skin: No lower extremity edema  Musculoskeletal: Passive range of motion to joint  normal  Psychiatric: Flat and poor eye contact  Neurologic: Able to grip my fingers reflexes equivocal  Labs on Admission:  Basic Metabolic Panel:  Lab 07/06/12 1610  NA 141  K 4.4  CL 106  CO2 24  GLUCOSE 90  BUN 11  CREATININE 0.43*  CALCIUM 9.0  MG --  PHOS --   Liver Function Tests:  Lab 07/06/12 1210  AST 28  ALT 23  ALKPHOS 102  BILITOT 0.2*  PROT 6.6  ALBUMIN 2.8*   No results found for this basename: LIPASE:5,AMYLASE:5 in the last 168 hours No results found for this basename: AMMONIA:5 in the last 168 hours CBC:  Lab 07/06/12 1210  WBC 5.7  NEUTROABS 3.0  HGB 13.1  HCT 39.4  MCV 81.6  PLT 254   Cardiac Enzymes: No results found for this basename: CKTOTAL:5,CKMB:5,CKMBINDEX:5,TROPONINI:5 in the last 168 hours BNP: No components found with this basename: POCBNP:5 CBG: No results found for this basename: GLUCAP:5 in the last 168 hours  Radiological Exams on Admission: Dg Chest 2 View  07/06/2012  *RADIOLOGY REPORT*  Clinical Data: Chest pain.  Failure to thrive.  CHEST - 2 VIEW  Comparison: 05/30/2012  Findings: Cardiac size is normal.  There is right upper lobe density, most consistent with infiltrate.  No evidence for pulmonary edema.  Shallow inflation.  IMPRESSION: Suspect right upper lobe infiltrate.  Follow-up is recommended to document clearing.   Original Report Authenticated By: Patterson Hammersmith, M.D.    Ct Head Wo Contrast  07/06/2012  *RADIOLOGY REPORT*  Clinical Data: Headache.  Altered behavior.  CT HEAD WITHOUT CONTRAST  Technique:  Contiguous axial images were obtained from the base of the skull through the vertex without contrast.  Comparison: 04/21/2011  Findings: Ventriculomegaly noted with prominent temporal horns and flattening of the head of the caudate nuclei.  White matter hypodensity is present and may reflect chronic ischemic microvascular white matter disease although transependymal CSF blockage cannot be excluded.  Subacute  appearing right-sided subdural hematoma node along the frontal and parietal lobes, measuring up to 5 mm in thickness along the right frontal lobe.  There is about 2 mm of leftward shift of the anterior falx. There is also a small left subdural hematoma along the frontal and parietal lobes, up to about 4 mm in thickness.  Interval right craniotomy noted.  There also appears to have been a small left interval craniotomy.  Increase in prominence of low density left frontal extra-axial fluid could represent a chronic subdural hematoma remote left frontal lobe infarct suspected. Moderate prominence of the third ventricle.  No mass lesion or acute CVA observed.  IMPRESSION:  1.  Interval bilateral craniotomies, with small bilateral subdural hematomas.  Right-sided subdural hematoma has subacute components; the left has a slightly more chronic appearance. 2.  Increased ventriculomegaly compared the prior, with flattening of the caudate nucleus heads and increase temporal horn dilatation. There is also white matter hypodensity more prominent in the frontal lobes, potentially from transit minimal CSF spread and / or chronic microvascular white matter disease.  The appearance is suspicious for mild hydrocephalus, with normal pressure hydrocephalus not excluded. 3.  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   Original Report Authenticated By: Dellia Cloud, M.D.     EKG: Independently reviewed. NSR.  No acute ST-T changes  Time spent: 40  Rhetta Mura [facility] Pager [phone]  If 7PM-7AM, please contact night-coverage [email or web address] Password [email or web address] 07/06/2012, 1:38 PM

## 2012-07-07 DIAGNOSIS — J690 Pneumonitis due to inhalation of food and vomit: Secondary | ICD-10-CM

## 2012-07-07 LAB — CBC
HCT: 35.4 % — ABNORMAL LOW (ref 36.0–46.0)
Hemoglobin: 11.7 g/dL — ABNORMAL LOW (ref 12.0–15.0)
MCV: 80.8 fL (ref 78.0–100.0)
WBC: 5.8 10*3/uL (ref 4.0–10.5)

## 2012-07-07 NOTE — Discharge Summary (Signed)
Physician Discharge Summary  Kristi Webb WUJ:[id number] DOB: 05/03/1932 DOA: 07/06/2012  PCP: Sheila Oats, MD  Admit date: 07/06/2012 Discharge date: 07/07/2012  Recommendations for Outpatient Follow-up:  1. Thicken fluids 2. Recommend goals of care as outpatient 3. Continue HH at home with speech following  Discharge Diagnoses:  Principal Problem:  *Aspiration pneumonitis Active Problems:  Altered mental status  Dementia  Thyroid disorder  Malnutrition   Discharge Condition: gaurded  Diet recommendation: Per Speech therapy-No clinical s/s of aspiration with po observed- pt self fed ground meats, pureed mac and cheese, pureed spinach, Ensure drink, cranberry juice-nectar, and thin water. Delayed swallow noted with all boluses - use of straw increased efficiency.  Recommend pt continue puree/nectar diet with thin water between meals *after oral care and via tsp if comforting and prevents overt coughing/ aspiration* Compensatory strategies, aspiration precautions, adaptive equipment information (eg bolus flow cup) provided verbally and in writing to the pt's daughter. Demonstrated use of thickener to daughter.  Educated pt and daughter to chronicity of dysphagia and dementia and progression with possible modifications as needed. Daughter expressed gratitude for information provided. No further acute SLP needed. Thanks for the consult.   Filed Weights   07/06/12 1610  Weight: 61.236 kg (135 lb)    History of present illness:  78 yr old lady with multiple medcal illnesses presented to ED 10/7 due to a bout of coughin where she turned red in the face , she was slobering out of her mouth-this was witnessed by the daughter Kristi Webb who is the [place]. When the EMT's came she was checked out-at first she wouldn't open her mouth and wouldn't cooperate with themm. She seemed to come back to her normal self.  She normally at [place] is bedridden- is total care-she can colour by herself and  play with toys. She can feed herself at baseline-her daughter relates that she had condition for guardianship of her mother because she was living with someone else in the remote past.  By the daughter got here and was seen the patient she was back to her normal self-which is confusion and demented  Was recently d/c from Kristi Webb after last hospital stay about 2 weeks ago and this seemed to be doing fairly. Daughter reports the patient had been seen by speech therapy in the hospital and nursing home and initially had a thickened fluids however these were later changed to regular thin fluids and she has not had any further issues with regards to aspiration in the past however states that she is very concerned about the events of this morning.   Review of Systems:  Noncontributory-patient is apparently very reticent and will not talk unless she wants to talk. She also carries him significant dementia diagnosis and I cannot obtain a meaningful review of systems  The daughter is the main historian in the patient's care   Hospital Course:  Patient seen by speech therapy as per above No indication for Abx/  or further work-up  Procedures:  none (i.e. Studies not automatically included, echos, thoracentesis, etc; not x-rays)  Consultations:  Telephone consulted ID Dr. Luciana Axe  Speech therapy  Discharge Exam: Filed Vitals:   07/06/12 1420 07/06/12 1610 07/06/12 2307 07/07/12 0629  BP:   133/79 120/79  Pulse: 62 60 64 57  Temp:  98.9 F (37.2 C) 97.6 F (36.4 C) 98.6 F (37 C)  TempSrc:  Oral Oral Oral  Resp: 20 20 18 18   Height:  5\' 9"  (1.753 m)  Weight:  61.236 kg (135 lb)    SpO2: 98% 97% 98% 98%    General: alert not oreitned Cardiovascular: s1 s2 no m/r/g Respiratory: clwear, no added sound  Discharge Instructions     Medication List     As of 07/07/2012  1:56 PM    TAKE these medications         donepezil 10 MG tablet   Commonly known as: ARICEPT   Take  10 mg by mouth every morning.      levothyroxine 25 MCG tablet   Commonly known as: SYNTHROID, LEVOTHROID   Take 25 mcg by mouth every morning.      mirtazapine 15 MG tablet   Commonly known as: REMERON   Take 7.5 mg by mouth at bedtime as needed. For depression      multivitamin with minerals Tabs   Take 1 tablet by mouth every evening.      potassium chloride 10 MEQ tablet   Commonly known as: K-DUR   Take 10 mEq by mouth 2 (two) times daily.      vitamin C 500 MG tablet   Commonly known as: ASCORBIC ACID   Take 500 mg by mouth every evening.           The results of significant diagnostics from this hospitalization (including imaging, microbiology, ancillary and laboratory) are listed below for reference.    Significant Diagnostic Studies: Dg Chest 2 View  07/06/2012  *RADIOLOGY REPORT*  Clinical Data: Chest pain.  Failure to thrive.  CHEST - 2 VIEW  Comparison: 05/30/2012  Findings: Cardiac size is normal.  There is right upper lobe density, most consistent with infiltrate.  No evidence for pulmonary edema.  Shallow inflation.  IMPRESSION: Suspect right upper lobe infiltrate.  Follow-up is recommended to document clearing.   Original Report Authenticated By: Patterson Hammersmith, M.D.    Ct Head Wo Contrast  07/06/2012  *RADIOLOGY REPORT*  Clinical Data: Headache.  Altered behavior.  CT HEAD WITHOUT CONTRAST  Technique:  Contiguous axial images were obtained from the base of the skull through the vertex without contrast.  Comparison: 04/21/2011  Findings: Ventriculomegaly noted with prominent temporal horns and flattening of the head of the caudate nuclei.  White matter hypodensity is present and may reflect chronic ischemic microvascular white matter disease although transependymal CSF blockage cannot be excluded.  Subacute appearing right-sided subdural hematoma node along the frontal and parietal lobes, measuring up to 5 mm in thickness along the right frontal lobe.  There is  about 2 mm of leftward shift of the anterior falx. There is also a small left subdural hematoma along the frontal and parietal lobes, up to about 4 mm in thickness.  Interval right craniotomy noted.  There also appears to have been a small left interval craniotomy.  Increase in prominence of low density left frontal extra-axial fluid could represent a chronic subdural hematoma remote left frontal lobe infarct suspected. Moderate prominence of the third ventricle.  No mass lesion or acute CVA observed.  IMPRESSION:  1.  Interval bilateral craniotomies, with small bilateral subdural hematomas.  Right-sided subdural hematoma has subacute components; the left has a slightly more chronic appearance. 2.  Increased ventriculomegaly compared the prior, with flattening of the caudate nucleus heads and increase temporal horn dilatation. There is also white matter hypodensity more prominent in the frontal lobes, potentially from transit minimal CSF spread and / or chronic microvascular white matter disease.  The appearance is suspicious for mild hydrocephalus, with  normal pressure hydrocephalus not excluded. 3.  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   Original Report Authenticated By: Dellia Cloud, M.D.     Microbiology: No results found for this or any previous visit (from the past 240 hour(s)).   Labs: Basic Metabolic Panel:  Lab 07/06/12 1610  NA 141  K 4.4  CL 106  CO2 24  GLUCOSE 90  BUN 11  CREATININE 0.43*  CALCIUM 9.0  MG --  PHOS --   Liver Function Tests:  Lab 07/06/12 1210  AST 28  ALT 23  ALKPHOS 102  BILITOT 0.2*  PROT 6.6  ALBUMIN 2.8*   No results found for this basename: LIPASE:5,AMYLASE:5 in the last 168 hours No results found for this basename: AMMONIA:5 in the last 168 hours CBC:  Lab 07/07/12 0425 07/06/12 1210  WBC 5.8 5.7  NEUTROABS -- 3.0  HGB 11.7* 13.1  HCT 35.4* 39.4  MCV 80.8 81.6  PLT 242 254   Cardiac Enzymes: No results found for this  basename: CKTOTAL:5,CKMB:5,CKMBINDEX:5,TROPONINI:5 in the last 168 hours BNP: BNP (last 3 results) No results found for this basename: PROBNP:3 in the last 8760 hours CBG:  Lab 07/06/12 1136  GLUCAP 78    Time coordinating discharge: 18 minutes  Signed:  Rhetta Mura  [facility] 07/07/2012, 1:52 PM

## 2012-07-07 NOTE — Progress Notes (Signed)
SLP Note Order for swallow evaluation received.  SLP spoke to [place] (daughter) and made arrangements to meet her at pt's room at 1130 am for eval and to provide functional tips.  Pt currently asleep with nearly empty breakfast tray at bedside, appears comfortable.     Donavan Burnet, MS [facility] SLP [phone]

## 2012-07-07 NOTE — Progress Notes (Signed)
Patient discharge home with daughter, discharge instructions given patient's daughter verbalize understanding of instructions given, unable to complete My Chart due to computer not unable to log on to the Internet and patient was ready to leave, patient and daughter in stable condition at this time

## 2012-07-07 NOTE — Progress Notes (Signed)
Speech Pathology [phone]  BSE completed with daughter Kerry Fort in room.  Full report to follow.  No clinical s/s of aspiration with po observed- pt self fed ground meats, pureed mac and cheese, pureed spinach, Ensure drink, cranberry juice-nectar, and thin water. Delayed swallow noted with all boluses - use of straw increased efficiency.    Recommend pt continue puree/nectar diet with thin water between meals *after oral care and via tsp if comforting and prevents overt coughing/ aspiration* Compensatory strategies, aspiration precautions, adaptive equipment information (eg bolus flow cup) provided verbally and in writing to the pt's daughter.   Demonstrated use of thickener to daughter.    Educated pt and daughter to chronicity of dysphagia and dementia and progression with possible modifications as needed.  Daughter expressed gratitude for information provided.  No further acute SLP needed.  Thanks for the consult.  Donavan Burnet, MS [facility] SLP [phone]

## 2012-07-07 NOTE — Evaluation (Signed)
Clinical/Bedside Swallow Evaluation Patient Details  Name: Kristi Webb MRN: [id number] Date of Birth: [birth year]  Today's Date: 07/07/2012 Time: 1145-1300 SLP Time Calculation (min): 75 min  Past Medical History:  Past Medical History  Diagnosis Date  . Dementia     worse since 2012  . Thrush   . Thyroid disease   . FTT (failure to thrive) in adult   . 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 SDH (subdural hematoma)     b/l 11/2011 at [facility] placed on keppra 250 mg bid    Past Surgical History:  Past Surgical History  Procedure Date  . Other surgical history 04/21/2011    left hip internal fixation and reduction intertrochanteric and femoral neck  . Other surgical history ~2 yrs ago    brain surgery   HPI:  78 yo adm to [facility] 10/06 after aspirating water at home-aspiration pneumonitis.  PMH + for dementia, recent thrush, SDH 2013 Keppra, h/o smoking,   Suspect right lower lobe infiltrate per CXR 07/06/12.  Pt had recently been at [facility] and had been evaluated for dysphagia - initially npo transiting to po intake of nectar than finally full thin liquids.  She had been at [facility] for approx 2 weeks and was tolerating puree/thin diet.-dc'd home a few weeks prior.     Assessment / Plan / Recommendation Clinical Impression  Pt presents with mild dysphagia that appears consistent with dementia.  No clinical indicators of aspiration observed, delayed swallow observed but pt fed herself.  Use of straw improved efficiency of swallow with liquids.   Daughter reports pt self feeds at home but at times at rapid rate.  Advised to obtain children's utencils to aid compensation.    A large percentage of time was spent educating pt's daughter Lynden Ang to possible diet modifications, compensatory strategies, fluctuation in swallow ability, etc.  All information provided in writing and verbally - daughter verbalized understanding to information provided.      Aspiration Risk  Mild    Diet Recommendation  Dysphagia 1 (Puree);Nectar-thick liquid (frazier water protocol )   Liquid Administration via: Spoon;Cup;Straw Medication Administration: Whole meds with puree Supervision: Full supervision/cueing for compensatory strategies (to slow pace - advised using children's utencils) Postural Changes and/or Swallow Maneuvers: Seated upright 90 degrees;Upright 30-60 min after meal    Other  Recommendations Oral Care Recommendations: Oral care before and after PO Other Recommendations: Order thickener from pharmacy   Follow Up Recommendations  Home health SLP    Frequency and Duration        Pertinent Vitals/Pain Afebrile, decreased    SLP Swallow Goals     Swallow Study Prior Functional Status       General HPI: 78 yo adm to [facility] 10/06 after aspirating water at home-aspiration pneumonitis.  PMH + for dementia, recent thrush, SDH 2013 Keppra, h/o smoking,   Suspect right lower lobe infiltrate per CXR 07/06/12.  Pt had recently been at [facility] and had been evaluated for dysphagia - initially npo transiting to po intake of nectar than finally full thin liquids.  She had been at [facility] for approx 2 weeks and was tolerating puree/thin diet.-dc'd home a few weeks prior.   Type of Study: Bedside swallow evaluation Previous Swallow Assessment: [facility] Diet Prior to this Study: Honey-thick liquids;Dysphagia 1 (puree) Temperature Spikes Noted: Yes Respiratory Status: Room air History of Recent Intubation: No Behavior/Cognition: Alert;Cooperative;Doesn't follow directions Oral Cavity - Dentition: Missing dentition (  lower missing, top implant per daughter) Self-Feeding Abilities: Able to feed self Patient Positioning: Upright in bed Baseline Vocal Quality: Other (comment) (no phonation observed-dtr states mom talks sometimes) Volitional Cough: Cognitively unable to elicit Volitional Swallow: Unable to elicit    Oral/Motor/Sensory Function Overall Oral  Motor/Sensory Function: Other (comment) (unable to test, ? mild general weakness)   Ice Chips Ice chips: Not tested   Thin Liquid Thin Liquid: Impaired Presentation: Cup;Straw Pharyngeal  Phase Impairments: Suspected delayed Swallow    Nectar Thick Nectar Thick Liquid: Impaired Presentation: Straw;Self Fed Other Comments: mild delayed swallow when sipping from cup-more timely swallow via straw   Honey Thick Honey Thick Liquid: Not tested   Puree Puree: Impaired Oral Phase Impairments: Impaired anterior to posterior transit Oral Phase Functional Implications: Prolonged oral transit (no oral pocketing) Pharyngeal Phase Impairments: Suspected delayed Swallow   Solid   GO Functional Assessment Tool Used: clinical judgement Functional Limitations: Swallowing Swallow Current Status (Z6109): At least 60 percent but less than 80 percent impaired, limited or restricted Swallow Goal Status [phone]): At least 60 percent but less than 80 percent impaired, limited or restricted Swallow Discharge Status [phone]): At least 60 percent but less than 80 percent impaired, limited or restricted  Solid: Not tested Other Comments: due to asp risk       Donavan Burnet, MS [facility] SLP [phone]

## 2012-07-07 NOTE — Progress Notes (Signed)
[facility]  Patient Status: Active (receiving services up to time of hospitalization)  [facility] is providing the following services: RN, PT, OT, ST and HHA  If patient discharges after hours, please call [phone].   Lanae Crumbly 07/07/2012, 10:40 AM

## 2012-08-05 IMAGING — CT CT CHEST W/ CM
2 of 3 series · 15 of 36 positions shown, 18 images · IV contrast (APPLIED)
Comparison: Contemporaneous radiograph

CLINICAL DATA: Abnormal chest x-ray.

CT CHEST WITH CONTRAST
TECHNIQUE: Multidetector CT imaging of the chest was performed
following the standard protocol during bolus administration of
intravenous contrast.
Contrast: 100 ml Vmnipaque-577 intravenous contrast

[Series 2: chest routine 5.0 b40f · axial · 0.68mm/px · z∈[-279,-49]mm · 12 of 56 slices shown, 15 images]
[im 5/56  mediastinal]
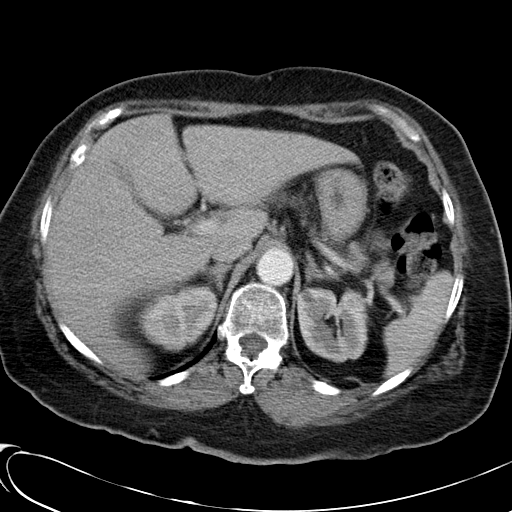
[im 5/56  lung]
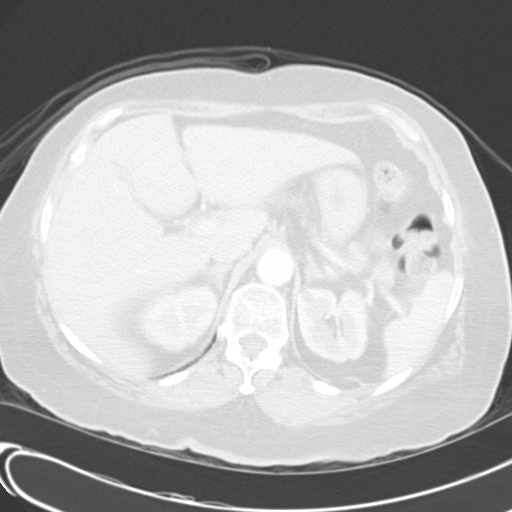
[im 9/56  lung]
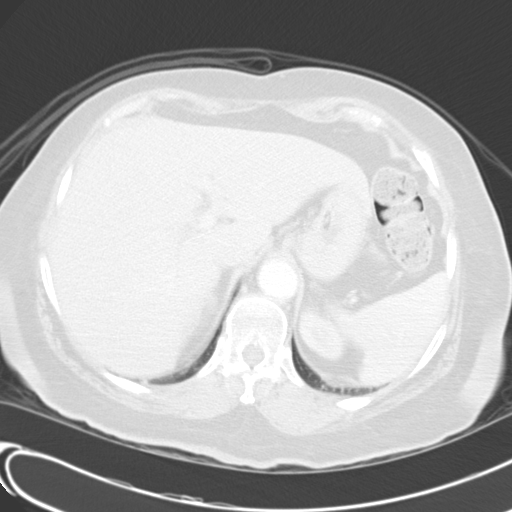
[im 13/56  lung]
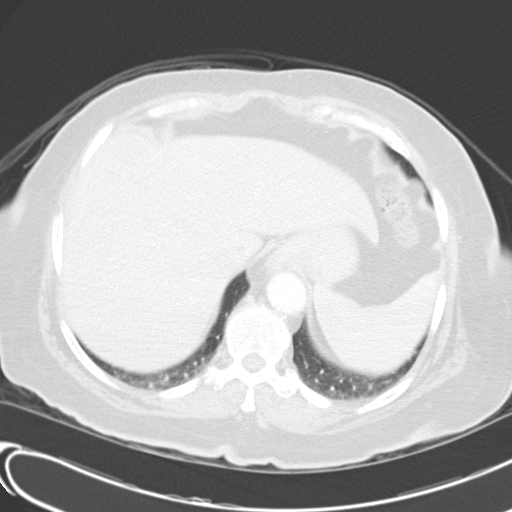
[im 17/56  lung]
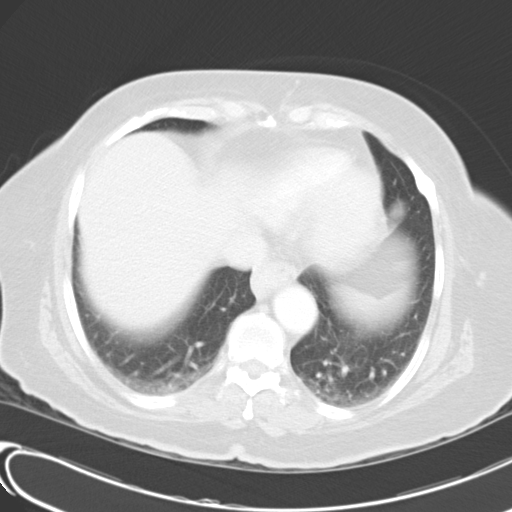
[im 21/56  mediastinal]
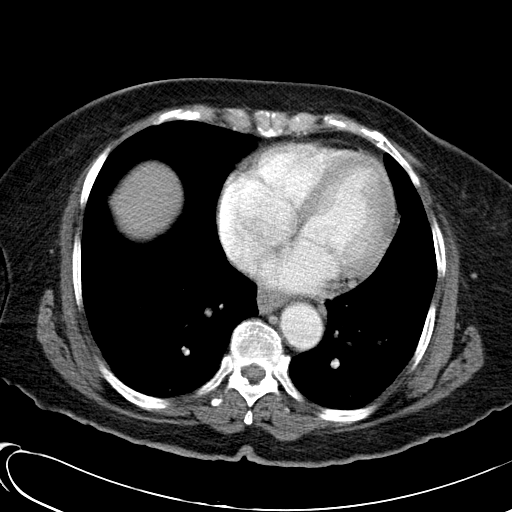
[im 21/56  lung]
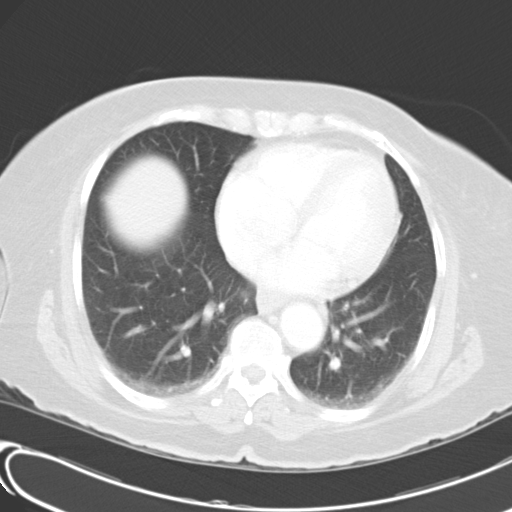
[im 25/56  lung]
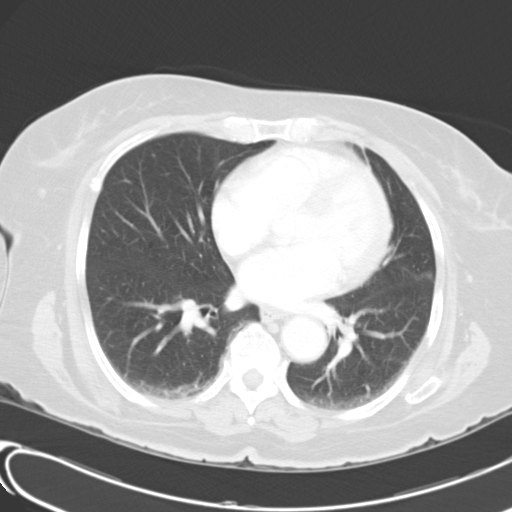
[im 31/56  lung]
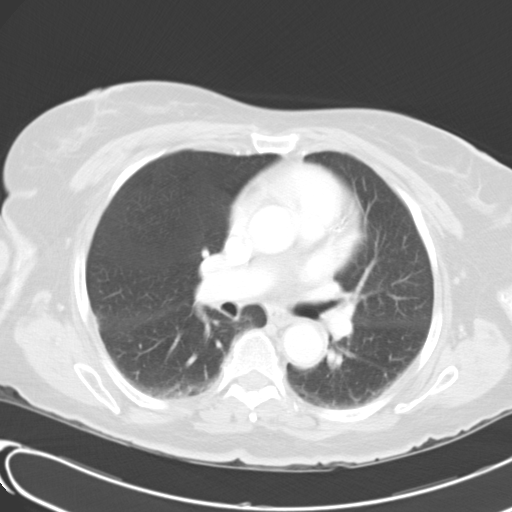
[im 35/56  lung]
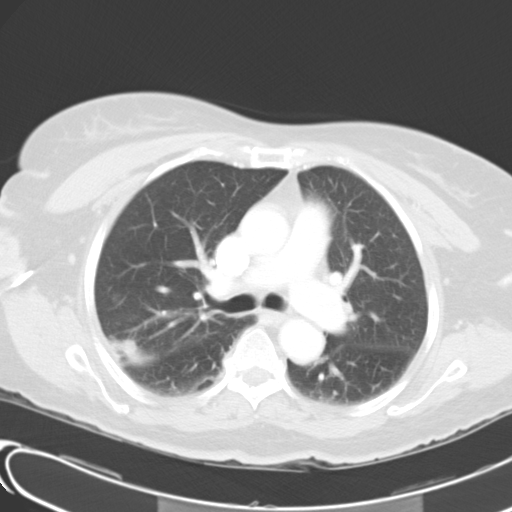
[im 39/56  mediastinal]
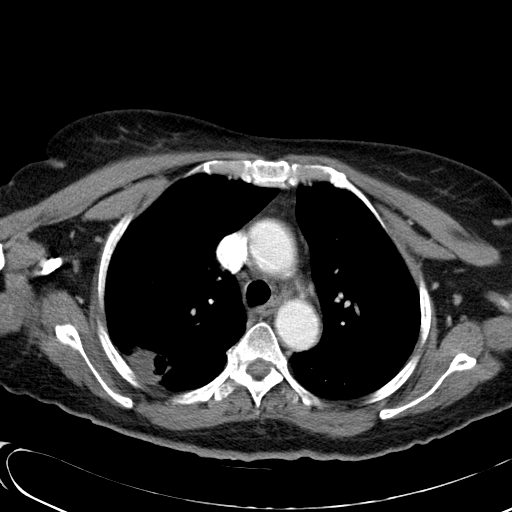
[im 39/56  lung]
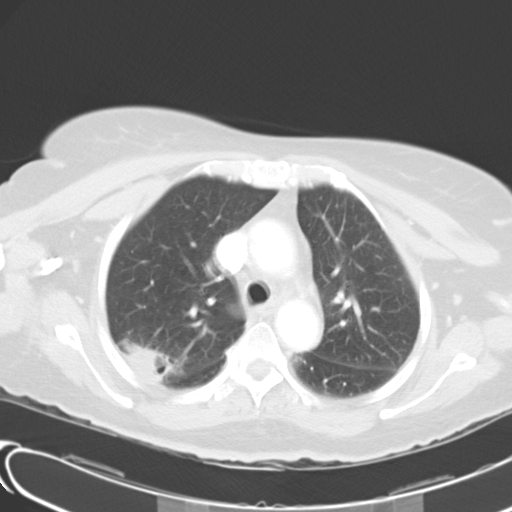
[im 43/56  lung]
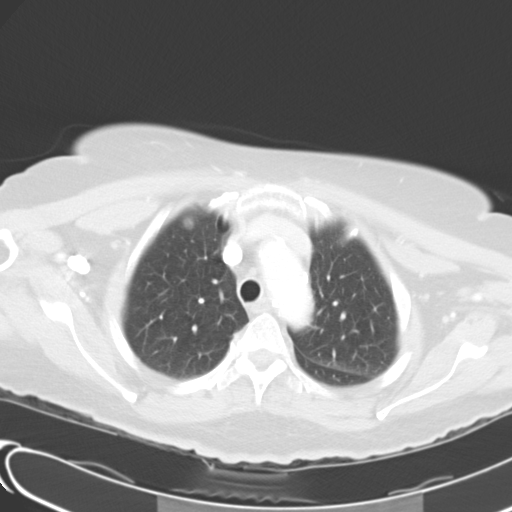
[im 47/56  lung]
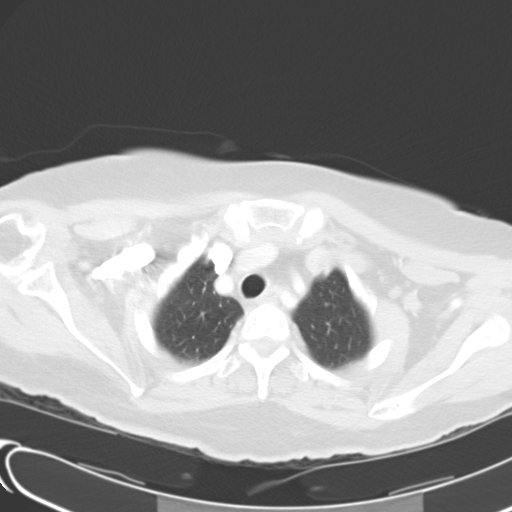
[im 51/56  lung]
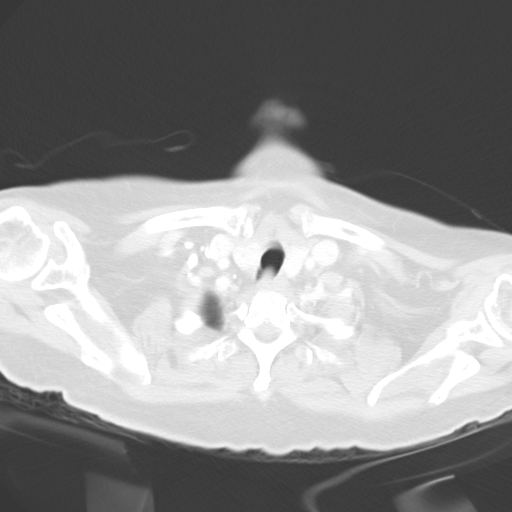

[Series 602: coronal cheset · coronal · 0.68mm/px · 3 of 109 slices shown]
[im 22/109  lung]
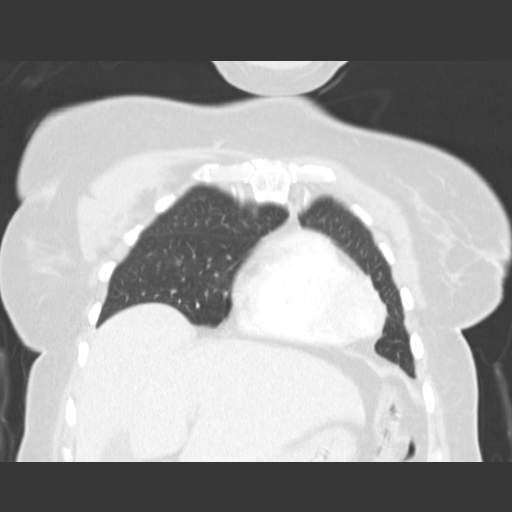
[im 44/109  lung]
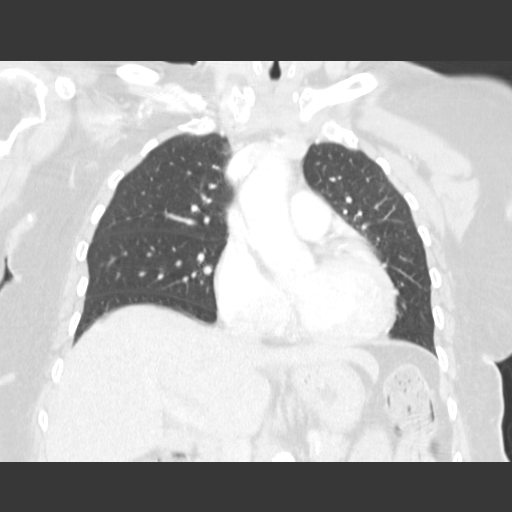
[im 65/109  lung]
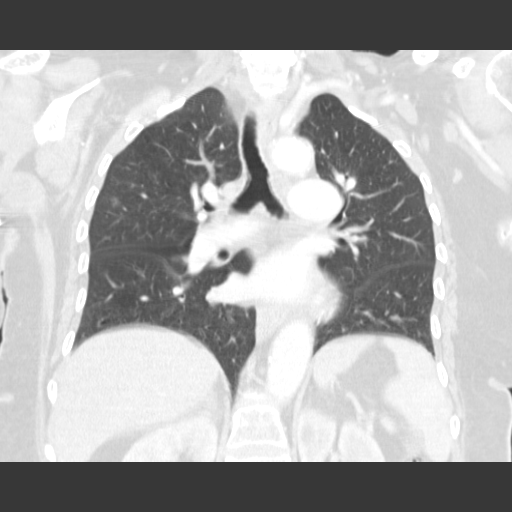

[15 of 36 positions shown; findings below may reference images not displayed]

FINDINGS: Fluid attenuation within the right upper lobe, along the
major fissure.  There is associated cavitation medially.  Mild
scattered bullous changes.  No pneumothorax.  No pleural fluid
elsewhere.  Mild dependent atelectasis.

Small hiatal hernia. Heart size is mildly enlarged.  No pericardial
effusion.  Scattered atherosclerotic calcification of the aortic
arch and aortic root/valve. Mild ectasia of the aorta.  Pulmonary
vasculature is patent centrally.  Lobar and segmental branches are
not evaluated.

Nonspecific hypodensities subcentimeter within the upper pole left
kidney. Punctate calcification within the liver is in keeping with
prior granulomas infection.

Multiple remote appearing bilateral anterior rib fractures.
IMPRESSION: Oval shaped fluid attenuation along the major fissure within the
right upper lung, with a cavitated medial component.  This may
represent fluid within a previous cavity. While disfavored,
superimposed acute infection (including mycobacterial) cannot be
entirely excluded in the appropriate clinical setting.
Furthermore, while no enhancement is appreciated, it is not
possible to exclude an underlying mass, which could be further
evaluated with PET-CT.

Discussed with Dr. Kai Sang at [DATE] a.m. on 04/21/2011.

## 2012-09-14 ENCOUNTER — Emergency Department (HOSPITAL_COMMUNITY)
Admission: EM | Admit: 2012-09-14 | Discharge: 2012-09-14 | Disposition: A | Discharge status: Home / Self Care | Payer: Medicare Other | Attending: Emergency Medicine | Admitting: Emergency Medicine

## 2012-09-14 ENCOUNTER — Other Ambulatory Visit: Payer: Self-pay

## 2012-09-14 ENCOUNTER — Emergency Department (HOSPITAL_COMMUNITY): Payer: Medicare Other

## 2012-09-14 DIAGNOSIS — Z79899 Other long term (current) drug therapy: Secondary | ICD-10-CM | POA: Insufficient documentation

## 2012-09-14 DIAGNOSIS — F0390 Unspecified dementia without behavioral disturbance: Secondary | ICD-10-CM | POA: Insufficient documentation

## 2012-09-14 DIAGNOSIS — Z87891 Personal history of nicotine dependence: Secondary | ICD-10-CM | POA: Insufficient documentation

## 2012-09-14 DIAGNOSIS — N390 Urinary tract infection, site not specified: Secondary | ICD-10-CM | POA: Insufficient documentation

## 2012-09-14 DIAGNOSIS — E079 Disorder of thyroid, unspecified: Secondary | ICD-10-CM | POA: Insufficient documentation

## 2012-09-14 DIAGNOSIS — D890 Polyclonal hypergammaglobulinemia: Secondary | ICD-10-CM | POA: Insufficient documentation

## 2012-09-14 DIAGNOSIS — Z7982 Long term (current) use of aspirin: Secondary | ICD-10-CM | POA: Insufficient documentation

## 2012-09-14 DIAGNOSIS — Z8679 Personal history of other diseases of the circulatory system: Secondary | ICD-10-CM | POA: Insufficient documentation

## 2012-09-14 LAB — CBC WITH DIFFERENTIAL/PLATELET
Eosinophils Absolute: 0 10*3/uL (ref 0.0–0.7)
Eosinophils Relative: 0 % (ref 0–5)
HCT: 41 % (ref 36.0–46.0)
Lymphocytes Relative: 28 % (ref 12–46)
Lymphs Abs: 1.3 10*3/uL (ref 0.7–4.0)
MCH: 26.7 pg (ref 26.0–34.0)
MCV: 82.8 fL (ref 78.0–100.0)
Monocytes Absolute: 0.5 10*3/uL (ref 0.1–1.0)
Monocytes Relative: 10 % (ref 3–12)
RBC: 4.95 MIL/uL (ref 3.87–5.11)
WBC: 4.8 10*3/uL (ref 4.0–10.5)

## 2012-09-14 LAB — URINALYSIS, ROUTINE W REFLEX MICROSCOPIC
Bilirubin Urine: NEGATIVE
Nitrite: POSITIVE — AB
Specific Gravity, Urine: 1.013 (ref 1.005–1.030)
Urobilinogen, UA: 0.2 mg/dL (ref 0.0–1.0)

## 2012-09-14 LAB — COMPREHENSIVE METABOLIC PANEL
ALT: 9 U/L (ref 0–35)
BUN: 12 mg/dL (ref 6–23)
CO2: 22 mEq/L (ref 19–32)
Calcium: 9.4 mg/dL (ref 8.4–10.5)
Creatinine, Ser: 0.48 mg/dL — ABNORMAL LOW (ref 0.50–1.10)
GFR calc Af Amer: >90 mL/min (ref >90)
GFR calc non Af Amer: >90 mL/min (ref >90)
Glucose, Bld: 158 mg/dL — ABNORMAL HIGH (ref 70–99)

## 2012-09-14 LAB — URINE MICROSCOPIC-ADD ON

## 2012-09-14 MED ORDER — SULFAMETHOXAZOLE-TRIMETHOPRIM 800-160 MG PO TABS: 1.0000 | ORAL_TABLET | Freq: Two times a day (BID) | ORAL | Status: DC

## 2012-09-14 MED ORDER — SULFAMETHOXAZOLE-TMP DS 800-160 MG PO TABS
1.0000 | ORAL_TABLET | Freq: Once | ORAL | Status: AC
Start: 2012-09-14 — End: 2012-09-14
  Administered 2012-09-14: 1 via ORAL
  Filled 2012-09-14: qty 1

## 2012-09-14 NOTE — ED Provider Notes (Signed)
History     CSN: [id number]  Arrival date & time 09/14/12  1406   First MD Initiated Contact with Patient 09/14/12 1515      Chief Complaint  Patient presents with  . Loss of Consciousness     HPI  The patient presents of episode of loss of consciousness.  The patient has dementia, is incapable of providing any history of present illness.  The patient's daughter, caregiver is here, provides all information.  She states that approximately 2 hours ago, while eating and the patient had an episode of altered mental status, possibly loss of consciousness.  For approximately 15 minutes the patient is listless.  She subsequently recovered back to her baseline. There was no clear precipitant.  No treatment was provided prior to recovery. The caregiver reports that the patient has a history of subdural hematoma as well as her dementia, is currently on Keppra   Past Medical History  Diagnosis Date  . Dementia     worse since 2012  . Thrush   . Thyroid disease   . FTT (failure to thrive) in adult   . 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 SDH (subdural hematoma)     b/l 11/2011 at [facility] placed on keppra 250 mg bid     Past Surgical History  Procedure Date  . Other surgical history 04/21/2011    left hip internal fixation and reduction intertrochanteric and femoral neck  . Other surgical history ~2 yrs ago    brain surgery    Family History  Problem Relation Age of Onset  . Hypertension      daughter 1(living)  . Suicidality      daughter 2    History  Substance Use Topics  . Smoking status: Former Smoker -- 1.0 packs/day for 40 years    Types: Cigarettes  . Smokeless tobacco: Never Used  . Alcohol Use: No    OB History    Grav Para Term Preterm Abortions TAB SAB Ect Mult Living                  Review of Systems  Unable to perform ROS: Dementia    Allergies  Review of patient's allergies indicates no known allergies.  Home Medications   Current Outpatient Rx   Name  Route  Sig  Dispense  Refill  . ASPIRIN EC 81 MG PO TBEC   Oral   Take 81 mg by mouth daily.         . DONEPEZIL HCL 10 MG PO TABS   Oral   Take 10 mg by mouth every morning.          Marland Kitchen LEVOTHYROXINE SODIUM 25 MCG PO TABS   Oral   Take 25 mcg by mouth every morning.          . ADULT MULTIVITAMIN W/MINERALS CH   Oral   Take 1 tablet by mouth every evening.         Marland Kitchen POTASSIUM CHLORIDE ER 10 MEQ PO TBCR   Oral   Take 10 mEq by mouth 2 (two) times daily.         Marland Kitchen RISPERIDONE 0.5 MG PO TABS   Oral   Take 0.5 mg by mouth 2 (two) times daily.         Marland Kitchen VITAMIN C 500 MG PO TABS   Oral   Take 500 mg by mouth every evening.           BP  100/68  Pulse 70  Temp 98.1 F (36.7 C) (Oral)  Resp 18  SpO2 97%  Physical Exam  Nursing note and vitals reviewed. Constitutional: She appears well-developed and well-nourished. She appears ill.  HENT:  Head: Normocephalic and atraumatic.  Eyes: Conjunctivae normal and EOM are normal.  Cardiovascular: Normal rate and regular rhythm.   Pulmonary/Chest: Effort normal and breath sounds normal. No stridor. No respiratory distress.  Abdominal: She exhibits no distension.  Musculoskeletal: She exhibits no edema.  Neurological: She is alert. No cranial nerve deficit. She exhibits abnormal muscle tone.       Patient does not elevate the L LE.  Strength in 3/5, throughout otherwise (likely 2/2 effort)   Skin: Skin is warm and dry.  Psychiatric: She has a normal mood and affect. She is slowed and withdrawn. Cognition and memory are impaired. She is noncommunicative. She exhibits abnormal recent memory and abnormal remote memory.       Patient speaks minimally- briefly She is inattentive.    ED Course  Procedures (including critical care time)   Labs Reviewed  CBC WITH DIFFERENTIAL  COMPREHENSIVE METABOLIC PANEL  URINALYSIS, ROUTINE W REFLEX MICROSCOPIC   No results found.   No diagnosis found.  Pulse ox: 99%ra,  nml  Cards: 70 sr, normal    MDM  This elderly female with dementia, multiple other medical issues, now presents after an episode of listlessness, questionable LOC.  On exam the patient has no complaints, according to her daughter she is interacting appropriately.  Given his return to baseline, her dementia, it is unclear what may have occurred.  Given the persistence of any neuro changes, there is low suspicion for acute ischemic event.  The patient's labs demonstrate the presence of a UTI.  Without the aforementioned changes, and with the patient's DNR status, imaging was not indicated.   Gerhard Munch, MD 09/14/12 [phone]

## 2012-09-14 NOTE — ED Notes (Signed)
Per EMS pt was at home with her daughter, new pt's caregiver, pt was eating lunch when she "started shaking and slumped over in her wheelchair and lost consciousness" family est pt being unresponsive approx 5 mins.

## 2012-09-14 NOTE — ED Notes (Signed)
BJY:NWGN<FA> Expected date:<BR> Expected time:<BR> Means of arrival:<BR> Comments:<BR> hold

## 2012-09-14 NOTE — ED Notes (Signed)
PTR called. 

## 2012-09-16 LAB — URINE CULTURE: Colony Count: >=100000

## 2012-12-01 DIAGNOSIS — Z789 Other specified health status: Secondary | ICD-10-CM | POA: Insufficient documentation

## 2013-09-12 IMAGING — CR DG CHEST 1V PORT
1 series · 1 of 1 positions shown · non-contrast
Comparison: 04/26/2011

CLINICAL DATA: Fatigue

PORTABLE CHEST - 1 VIEW

[AP]
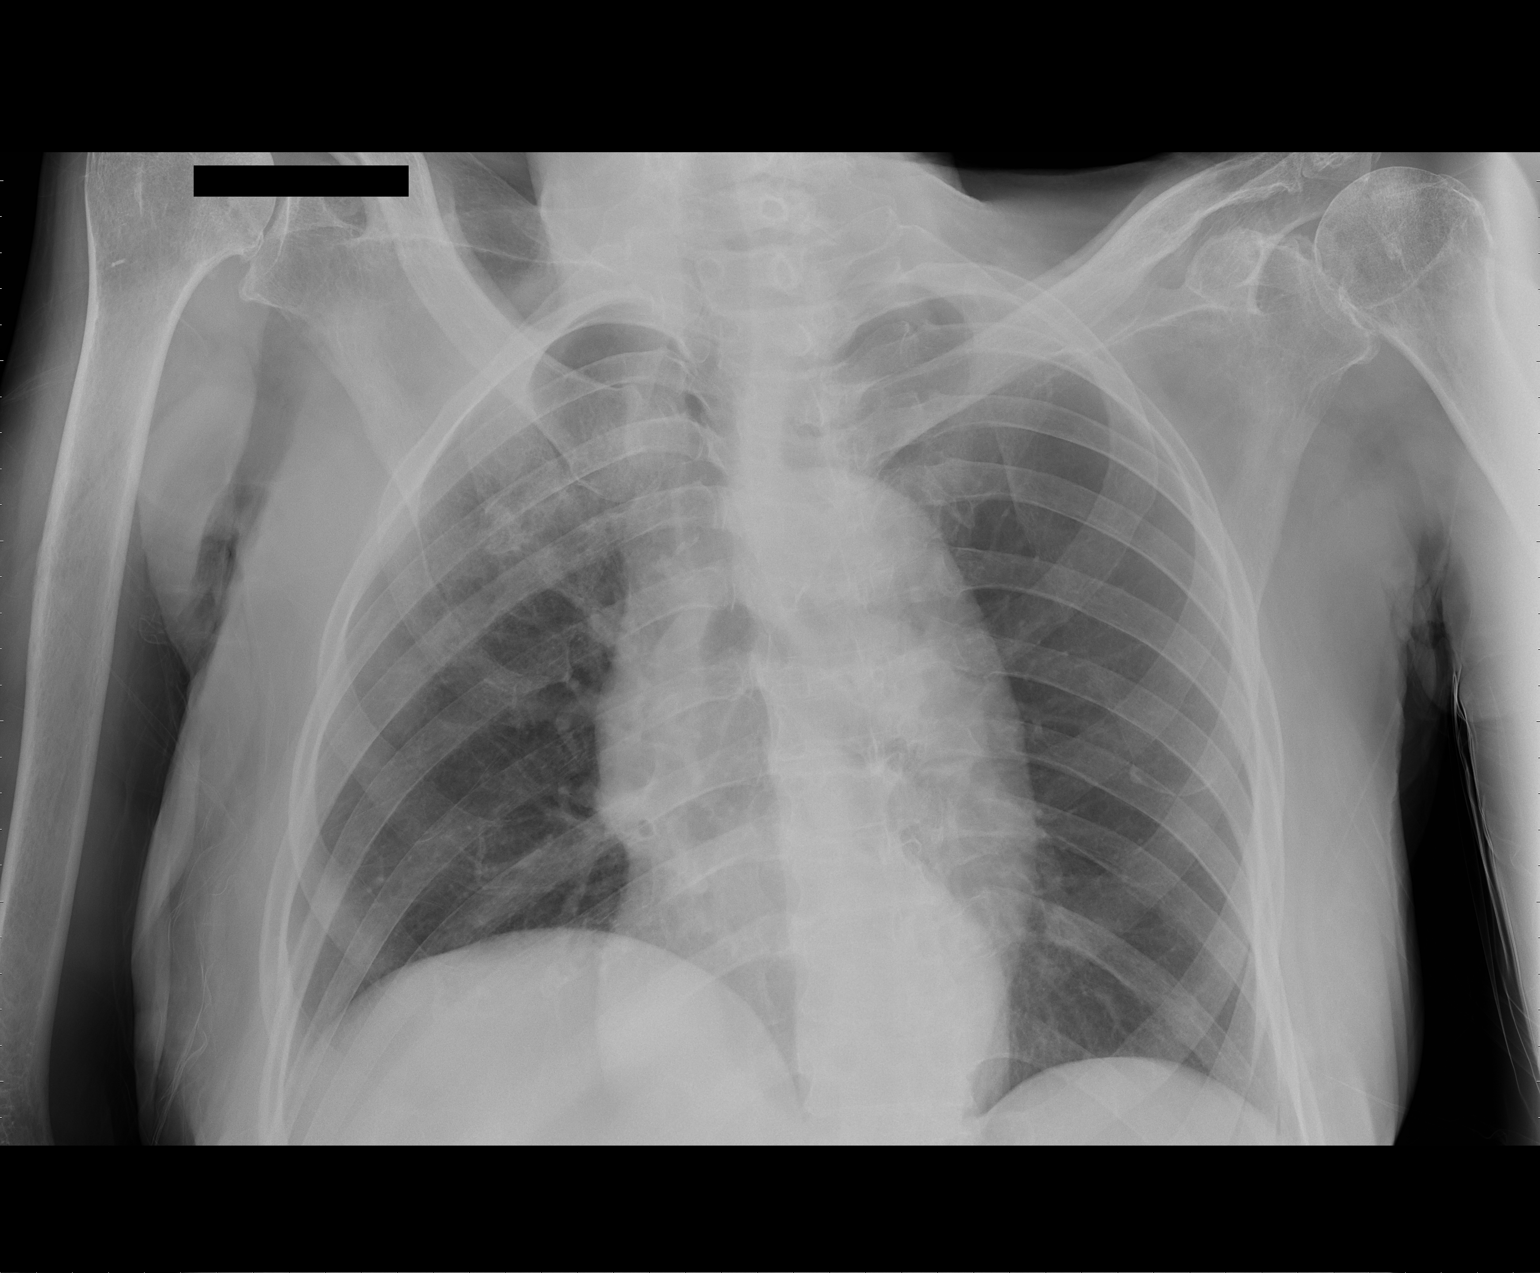

[1 of 1 positions shown; findings below may reference images not displayed]

FINDINGS: Lungs are essentially clear.  No pleural effusion or
pneumothorax.

The heart is normal in size.

Right suprahilar / paratracheal prominence.  While some of this may
reflect patient rotation, the appearance remains worrisome for
underlying adenopathy.
IMPRESSION: Right suprahilar / paratracheal prominence, worrisome for
underlying adenopathy.

Consider CT chest with contrast for further evaluation.

## 2013-12-23 ENCOUNTER — Encounter (HOSPITAL_COMMUNITY): Payer: Self-pay | Admitting: Emergency Medicine

## 2013-12-23 ENCOUNTER — Inpatient Hospital Stay (HOSPITAL_COMMUNITY)
Admission: EM | Admit: 2013-12-23 | Discharge: 2013-12-26 | DRG: 178 | Disposition: A | Discharge status: Home Health | Payer: Medicare Other | Attending: Internal Medicine | Admitting: Internal Medicine

## 2013-12-23 ENCOUNTER — Emergency Department (HOSPITAL_COMMUNITY): Payer: Medicare Other

## 2013-12-23 DIAGNOSIS — J209 Acute bronchitis, unspecified: Secondary | ICD-10-CM | POA: Diagnosis not present

## 2013-12-23 DIAGNOSIS — J690 Pneumonitis due to inhalation of food and vomit: Principal | ICD-10-CM | POA: Diagnosis present

## 2013-12-23 DIAGNOSIS — E039 Hypothyroidism, unspecified: Secondary | ICD-10-CM | POA: Diagnosis present

## 2013-12-23 DIAGNOSIS — B370 Candidal stomatitis: Secondary | ICD-10-CM | POA: Diagnosis present

## 2013-12-23 DIAGNOSIS — R509 Fever, unspecified: Secondary | ICD-10-CM | POA: Diagnosis present

## 2013-12-23 DIAGNOSIS — D72829 Elevated white blood cell count, unspecified: Secondary | ICD-10-CM | POA: Diagnosis not present

## 2013-12-23 DIAGNOSIS — Z66 Do not resuscitate: Secondary | ICD-10-CM | POA: Diagnosis present

## 2013-12-23 DIAGNOSIS — K5900 Constipation, unspecified: Secondary | ICD-10-CM | POA: Diagnosis not present

## 2013-12-23 DIAGNOSIS — R5383 Other fatigue: Secondary | ICD-10-CM | POA: Diagnosis not present

## 2013-12-23 DIAGNOSIS — Z7982 Long term (current) use of aspirin: Secondary | ICD-10-CM | POA: Diagnosis not present

## 2013-12-23 DIAGNOSIS — F0390 Unspecified dementia without behavioral disturbance: Secondary | ICD-10-CM | POA: Diagnosis present

## 2013-12-23 DIAGNOSIS — R109 Unspecified abdominal pain: Secondary | ICD-10-CM | POA: Diagnosis not present

## 2013-12-23 DIAGNOSIS — R5381 Other malaise: Secondary | ICD-10-CM | POA: Diagnosis not present

## 2013-12-23 DIAGNOSIS — R627 Adult failure to thrive: Secondary | ICD-10-CM

## 2013-12-23 DIAGNOSIS — Z79899 Other long term (current) drug therapy: Secondary | ICD-10-CM | POA: Diagnosis not present

## 2013-12-23 DIAGNOSIS — J189 Pneumonia, unspecified organism: Secondary | ICD-10-CM | POA: Diagnosis not present

## 2013-12-23 DIAGNOSIS — E079 Disorder of thyroid, unspecified: Secondary | ICD-10-CM

## 2013-12-23 DIAGNOSIS — F0280 Dementia in other diseases classified elsewhere without behavioral disturbance: Secondary | ICD-10-CM | POA: Diagnosis present

## 2013-12-23 DIAGNOSIS — R404 Transient alteration of awareness: Secondary | ICD-10-CM | POA: Diagnosis not present

## 2013-12-23 DIAGNOSIS — E46 Unspecified protein-calorie malnutrition: Secondary | ICD-10-CM

## 2013-12-23 DIAGNOSIS — G309 Alzheimer's disease, unspecified: Secondary | ICD-10-CM | POA: Diagnosis present

## 2013-12-23 DIAGNOSIS — Z87891 Personal history of nicotine dependence: Secondary | ICD-10-CM

## 2013-12-23 DIAGNOSIS — J40 Bronchitis, not specified as acute or chronic: Secondary | ICD-10-CM

## 2013-12-23 LAB — CBC WITH DIFFERENTIAL/PLATELET
Basophils Absolute: 0 10*3/uL (ref 0.0–0.1)
Basophils Relative: 0 % (ref 0–1)
EOS ABS: 0 10*3/uL (ref 0.0–0.7)
EOS PCT: 0 % (ref 0–5)
HEMATOCRIT: 38.6 % (ref 36.0–46.0)
Hemoglobin: 13 g/dL (ref 12.0–15.0)
LYMPHS ABS: 2.6 10*3/uL (ref 0.7–4.0)
LYMPHS PCT: 14 % (ref 12–46)
MCH: 28.2 pg (ref 26.0–34.0)
MCHC: 33.7 g/dL (ref 30.0–36.0)
MCV: 83.7 fL (ref 78.0–100.0)
MONO ABS: 2.1 10*3/uL — AB (ref 0.1–1.0)
MONOS PCT: 11 % (ref 3–12)
Neutro Abs: 14.6 10*3/uL — ABNORMAL HIGH (ref 1.7–7.7)
Neutrophils Relative %: 76 % (ref 43–77)
PLATELETS: 286 10*3/uL (ref 150–400)
RBC: 4.61 MIL/uL (ref 3.87–5.11)
RDW: 14 % (ref 11.5–15.5)
WBC: 19.4 10*3/uL — AB (ref 4.0–10.5)

## 2013-12-23 LAB — COMPREHENSIVE METABOLIC PANEL
ALK PHOS: 99 U/L (ref 39–117)
ALT: 9 U/L (ref 0–35)
AST: 12 U/L (ref 0–37)
Albumin: 2.6 g/dL — ABNORMAL LOW (ref 3.5–5.2)
BILIRUBIN TOTAL: 0.4 mg/dL (ref 0.3–1.2)
BUN: 9 mg/dL (ref 6–23)
CALCIUM: 9.4 mg/dL (ref 8.4–10.5)
CO2: 23 meq/L (ref 19–32)
CREATININE: 0.62 mg/dL (ref 0.50–1.10)
Chloride: 102 mEq/L (ref 96–112)
GFR calc Af Amer: >90 mL/min (ref >90)
GFR calc non Af Amer: 83 mL/min — ABNORMAL LOW (ref >90)
GLUCOSE: 99 mg/dL (ref 70–99)
Potassium: 4.1 mEq/L (ref 3.7–5.3)
Sodium: 138 mEq/L (ref 137–147)
TOTAL PROTEIN: 8.1 g/dL (ref 6.0–8.3)

## 2013-12-23 LAB — URINALYSIS, ROUTINE W REFLEX MICROSCOPIC
Bilirubin Urine: NEGATIVE
GLUCOSE, UA: NEGATIVE mg/dL
HGB URINE DIPSTICK: NEGATIVE
KETONES UR: NEGATIVE mg/dL
Leukocytes, UA: NEGATIVE
Nitrite: NEGATIVE
PROTEIN: NEGATIVE mg/dL
Specific Gravity, Urine: 1.002 — ABNORMAL LOW (ref 1.005–1.030)
Urobilinogen, UA: 0.2 mg/dL (ref 0.0–1.0)
pH: 5.5 (ref 5.0–8.0)

## 2013-12-23 LAB — TROPONIN I: Troponin I: <0.3 ng/mL (ref <0.30)

## 2013-12-23 MED ORDER — FLUCONAZOLE IN SODIUM CHLORIDE 200-0.9 MG/100ML-% IV SOLN
200.0000 mg | INTRAVENOUS | Status: DC
  Administered 2013-12-23 – 2013-12-25 (×3): 200 mg via INTRAVENOUS
  Filled 2013-12-23 (×4): qty 100

## 2013-12-23 MED ORDER — POTASSIUM CHLORIDE ER 10 MEQ PO TBCR
10.0000 meq | EXTENDED_RELEASE_TABLET | Freq: Two times a day (BID) | ORAL | Status: DC
  Administered 2013-12-23 – 2013-12-26 (×6): 10 meq via ORAL
  Filled 2013-12-23 (×8): qty 1

## 2013-12-23 MED ORDER — ACETAMINOPHEN 325 MG PO TABS
650.0000 mg | ORAL_TABLET | Freq: Four times a day (QID) | ORAL | Status: DC | PRN
  Administered 2013-12-23 – 2013-12-24 (×2): 650 mg via ORAL
  Filled 2013-12-23 (×2): qty 2

## 2013-12-23 MED ORDER — ADULT MULTIVITAMIN W/MINERALS CH
1.0000 | ORAL_TABLET | Freq: Every day | ORAL | Status: DC
  Administered 2013-12-24 – 2013-12-26 (×3): 1 via ORAL
  Filled 2013-12-23 (×3): qty 1

## 2013-12-23 MED ORDER — METRONIDAZOLE IN NACL 5-0.79 MG/ML-% IV SOLN
500.0000 mg | Freq: Three times a day (TID) | INTRAVENOUS | Status: DC
  Administered 2013-12-24 – 2013-12-26 (×9): 500 mg via INTRAVENOUS
  Filled 2013-12-23 (×10): qty 100

## 2013-12-23 MED ORDER — LEVOFLOXACIN IN D5W 750 MG/150ML IV SOLN
750.0000 mg | INTRAVENOUS | Status: DC
  Administered 2013-12-23 – 2013-12-25 (×3): 750 mg via INTRAVENOUS
  Filled 2013-12-23 (×4): qty 150

## 2013-12-23 MED ORDER — ONDANSETRON HCL 4 MG/2ML IJ SOLN: 4.0000 mg | Freq: Four times a day (QID) | INTRAMUSCULAR | Status: DC | PRN

## 2013-12-23 MED ORDER — SODIUM CHLORIDE 0.9 % IV SOLN
INTRAVENOUS | Status: DC
  Administered 2013-12-24: 1000 mL via INTRAVENOUS

## 2013-12-23 MED ORDER — HYDROCORTISONE 1 % EX OINT
1.0000 "application " | TOPICAL_OINTMENT | CUTANEOUS | Status: DC | PRN
  Administered 2013-12-24 – 2013-12-25 (×2): 1 via TOPICAL
  Filled 2013-12-23: qty 28.35

## 2013-12-23 MED ORDER — LEVOFLOXACIN IN D5W 500 MG/100ML IV SOLN
500.0000 mg | INTRAVENOUS | Status: DC
  Filled 2013-12-23: qty 100

## 2013-12-23 MED ORDER — VITAMIN C 500 MG PO TABS
500.0000 mg | ORAL_TABLET | Freq: Every evening | ORAL | Status: DC
  Administered 2013-12-23 – 2013-12-25 (×3): 500 mg via ORAL
  Filled 2013-12-23 (×4): qty 1

## 2013-12-23 MED ORDER — ONDANSETRON HCL 4 MG PO TABS: 4.0000 mg | ORAL_TABLET | Freq: Four times a day (QID) | ORAL | Status: DC | PRN

## 2013-12-23 MED ORDER — OSELTAMIVIR PHOSPHATE 75 MG PO CAPS
75.0000 mg | ORAL_CAPSULE | Freq: Two times a day (BID) | ORAL | Status: DC
  Administered 2013-12-23: 75 mg via ORAL
  Filled 2013-12-23 (×3): qty 1

## 2013-12-23 MED ORDER — RISPERIDONE 0.5 MG PO TABS
0.5000 mg | ORAL_TABLET | Freq: Two times a day (BID) | ORAL | Status: DC | PRN
  Filled 2013-12-23: qty 1

## 2013-12-23 MED ORDER — ENOXAPARIN SODIUM 40 MG/0.4ML ~~LOC~~ SOLN
40.0000 mg | SUBCUTANEOUS | Status: DC
  Administered 2013-12-23 – 2013-12-25 (×3): 40 mg via SUBCUTANEOUS
  Filled 2013-12-23 (×4): qty 0.4

## 2013-12-23 MED ORDER — DONEPEZIL HCL 10 MG PO TABS
10.0000 mg | ORAL_TABLET | Freq: Every morning | ORAL | Status: DC
  Administered 2013-12-24 – 2013-12-26 (×4): 10 mg via ORAL
  Filled 2013-12-23 (×4): qty 1

## 2013-12-23 MED ORDER — ASPIRIN EC 81 MG PO TBEC
81.0000 mg | DELAYED_RELEASE_TABLET | Freq: Every day | ORAL | Status: DC
  Administered 2013-12-24 – 2013-12-26 (×3): 81 mg via ORAL
  Filled 2013-12-23 (×3): qty 1

## 2013-12-23 MED ORDER — LEVOTHYROXINE SODIUM 25 MCG PO TABS
25.0000 ug | ORAL_TABLET | Freq: Every day | ORAL | Status: DC
  Administered 2013-12-24 – 2013-12-26 (×3): 25 ug via ORAL
  Filled 2013-12-23 (×4): qty 1

## 2013-12-23 MED ORDER — SODIUM CHLORIDE 0.9 % IV SOLN
INTRAVENOUS | Status: DC
  Administered 2013-12-23: 15:00:00 via INTRAVENOUS

## 2013-12-23 MED ORDER — SODIUM CHLORIDE 0.9 % IV SOLN: INTRAVENOUS | Status: DC

## 2013-12-23 NOTE — ED Notes (Signed)
Provided orange juice, patient tolerated well and finished entire cup. Dr. Lynelle DoctorKnapp aware.

## 2013-12-23 NOTE — ED Notes (Signed)
Bed: WA09 Expected date:  Expected time:  Means of arrival:  Comments: 80 y/o lethargy

## 2013-12-23 NOTE — H&P (Signed)
History and Physical  Kristi Webb JXB:[id number] DOB: Jul 12, 1932 DOA: 12/23/2013  Referring physician: Dr. Devoria Albe PCP: Default, Provider, MD   Chief Complaint: Fever   History of Present Illness: Kristi Webb is an 80 y.o. female with a PMH of dementia, non-ambulatory at baseline, who presents with her family who provide the history as the patient is nonverbal, with a two-day history of fever. Patient's family reports that last week she had 3 days of watery stools, 3-4 times per day but that this stopped on 12/17/13. Patient's family gradually increased her diet, but she continued to have poor oral intake. On 12/21/13, they started her on regular foods but reported she would close her mouth and refused to open it. The family notes that she has had fevers up to 102F. She has denied pain by shaking her head no when asked. She has had some coughing today. They report rigors. He only other complaint is watery eyes. Patient lives with her daughter and provides the care at night, and she has nursing assistance to provide her care during the day. She has had sick contacts with her daughter recently having a diarrheal illness that lasted one day, and her granddaughter and great grandchildren have had cough and fever/bronchitis. No aggravating or alleviating factors. No recent antibiotics.  Review of Systems: Constitutional: + fever, + chills;  Appetite diminished; No weight loss, no weight gain, + fatigue.  HEENT: No blurry vision, no diplopia, no pharyngitis, no dysphagia CV: No chest pain, no palpitations, no PND.  Resp: No SOB, + cough, no pleuritic pain. GI: No nausea, no vomiting, + recent diarrhea, no melena, no hematochezia, no constipation.  GU: No dysuria, no hematuria, no frequency, no urgency. MSK: no myalgias, no arthralgias.  Neuro:  No headache, no focal neurological deficits, no history of seizures.  Psych: No depression, no anxiety.  Endo: No heat intolerance, no cold intolerance, no  polyuria, no polydipsia  Skin: + rashes, no skin lesions.  Heme: No easy bruising.    Past Medical History Past Medical History  Diagnosis Date  . Dementia     worse since 2012  . Thrush   . Thyroid disease   . FTT (failure to thrive) in adult   . 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 SDH (subdural hematoma)     b/l 11/2011 at [facility] placed on keppra 250 mg bid      Past Surgical History Past Surgical History  Procedure Laterality Date  . Other surgical history  04/21/2011    left hip internal fixation and reduction intertrochanteric and femoral neck  . Other surgical history  ~2 yrs ago    brain surgery     Social History: History   Social History  . Marital Status: Divorced    Spouse Name: N/A    Number of Children: 3  . Years of Education: HS grad   Occupational History  . 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 Smoking status: Former Smoker -- 1.00 packs/day for 40 years    Types: Cigarettes  . Smokeless tobacco: Never Used  . Alcohol Use: No  . Drug Use: No  . Sexual Activity: Not on file   Other Topics Concern  . Not on file   Social History Narrative   [name]' daughter [name] recently took over custody of her mother in court. As of last Saturday (05/24/12) [name] is living with this daughter. The  patient was previously living with a female roommate for 10-12 years who did not allow the family to see the patient for the last 2 years.  Widowed, 4 children, 2 boys 2 girls.  Several grandchildren and several great grandchildren.  High school graduate worked in the [employer]           Family History:  Family History  Problem Relation Age of Onset  . Hypertension      daughter 1(living)  . Suicidality      daughter 2    Allergies: Review of patient's allergies indicates no known allergies.  Meds: Prior to Admission medications   Medication Sig Start Date End Date Taking? Authorizing Provider    acetaminophen (TYLENOL) 325 MG tablet Take 650 mg by mouth every 6 (six) hours as needed for headache.   Yes Historical Provider, MD  aspirin EC 81 MG tablet Take 81 mg by mouth daily.   Yes Historical Provider, MD  donepezil (ARICEPT) 10 MG tablet Take 10 mg by mouth every morning.    Yes Historical Provider, MD  hydrocortisone (CORTIZONE-10) 1 % ointment Apply 1 application topically as needed for itching.   Yes Historical Provider, MD  levothyroxine (SYNTHROID, LEVOTHROID) 25 MCG tablet Take 25 mcg by mouth every morning.    Yes Historical Provider, MD  Multiple Vitamin (MULTIVITAMIN WITH MINERALS) TABS Take 1 tablet by mouth daily.    Yes Historical Provider, MD  potassium chloride (K-DUR) 10 MEQ tablet Take 10 mEq by mouth 2 (two) times daily.   Yes Historical Provider, MD  risperiDONE (RISPERDAL) 0.5 MG tablet Take 0.5 mg by mouth 2 (two) times daily.   Yes Historical Provider, MD  vitamin C (ASCORBIC ACID) 500 MG tablet Take 500 mg by mouth every evening.   Yes Historical Provider, MD    Physical Exam: Filed Vitals:   12/23/13 1339 12/23/13 1349 12/23/13 1617 12/23/13 1712  BP:  112/62  124/80  Pulse:  85  82  Temp:  98.9 F (37.2 C) 99.6 F (37.6 C)   TempSrc:  Oral Rectal   Resp:  20  20  SpO2: 97% 96%  94%     Physical Exam: Blood pressure 124/80, pulse 82, temperature 99.6 F (37.6 C), temperature source Rectal, resp. rate 20, SpO2 94.00%. Gen: No acute distress. Nonverbal. Head: Normocephalic, atraumatic. Eyes: PERRL, EOMI, sclerae nonicteric. Mouth: Oropharynx clear with white coating to tongue. Neck: Supple, no thyromegaly, no lymphadenopathy, no jugular venous distention. Chest: Lungs diminished at the bases but otherwise clear. CV: Heart sounds are regular. No murmurs, rubs, or gallops. Abdomen: Soft, nontender, nondistended with normal active bowel sounds. Extremities: Extremities Skin: Cool and clammy to touch. Neuro: Disoriented; cranial nerves II through  XII grossly intact. Psych: Mood and affect flat.  Labs on Admission:  Basic Metabolic Panel:  Recent Labs Lab 12/23/13 1530  NA 138  K 4.1  CL 102  CO2 23  GLUCOSE 99  BUN 9  CREATININE 0.62  CALCIUM 9.4   Liver Function Tests:  Recent Labs Lab 12/23/13 1530  AST 12  ALT 9  ALKPHOS 99  BILITOT 0.4  PROT 8.1  ALBUMIN 2.6*   CBC:  Recent Labs Lab 12/23/13 1530  WBC 19.4*  NEUTROABS 14.6*  HGB 13.0  HCT 38.6  MCV 83.7  PLT 286   Cardiac Enzymes:  Recent Labs Lab 12/23/13 1530  TROPONINI <0.30    BNP (last 3 results) No results found for this basename: PROBNP,  in the  last 8760 hours CBG: No results found for this basename: GLUCAP,  in the last 168 hours  Radiological Exams on Admission: Dg Chest Portable 1 View  12/23/2013   CLINICAL DATA:  Fatigue, dementia, failure to thrive.  EXAM: PORTABLE CHEST - 1 VIEW  COMPARISON:  DG CHEST 2 VIEW dated 09/14/2012  FINDINGS: Enlarged cardiomediastinal silhouette. No active infiltrates or failure. Osseous demineralization.  IMPRESSION: No active disease.  Stable chest.   Electronically Signed   By: Davonna BellingJohn  Curnes M.D.   On: 12/23/2013 16:03    EKG: Independently reviewed. Sinus rhythm; Borderline left axis deviation; Borderline T abnormalities, anterior leads Prolonged QT interval; Baseline wander in lead(s) V3; No significant change since last.  Assessment/Plan Principal Problem:   FUO (fever of unknown origin) with leukocytosis Fever and chills of unclear origin. Patient has a history of aspiration pneumonitis in the past, and has not been swallowing her food, so she could have an early aspiration pneumonia that has not shown up on radiography yet. Blood cultures have been sent. Urinalysis negative for nitrites and leukocytes. Could have a viral syndrome. Influenza panel requested. Would treat with empiric Levaquin to cover aspiration pneumonia, Tamiflu to cover influenza, and Diflucan given her concurrent thrush.  Her diarrhea seems to have resolved and she has not had any recent antibiotic exposure. If she has recurrent diarrhea, within the GI pathogen panel and stool for C. difficile. Active Problems:   Dementia Continue Aricept and when necessary Risperdal.   Thrush Diflucan ordered.   Hypothyroidism Continue Synthroid. Check TSH.   DVT prophylaxis Lovenox.  Code Status: DNR Family Communication: Daughter at bedside. Disposition Plan: Home when stable.  Time spent: 1 hour.  RAMA,CHRISTINA [facility] Pager [phone] Cell: [phone]   If 7PM-7AM, please contact night-coverage [email or web address] Password [email or web address] 12/23/2013, 6:24 PM    **Disclaimer: This note was dictated with voice recognition software. Similar sounding words can inadvertently be transcribed and this note may contain transcription errors which may not have been corrected upon publication of note.**

## 2013-12-23 NOTE — ED Notes (Signed)
Patient presents today via EMS, family reports a 2-3 day decline in strength and decrease in oral intake. Patient has a medical history of dementia and is total care at home.

## 2013-12-23 NOTE — ED Provider Notes (Signed)
CSN: [id number]     Arrival date & time 12/23/13  1339 History   First MD Initiated Contact with Patient 12/23/13 1458     Chief Complaint  Patient presents with  . Fatigue   Level 5 Caveat for dementia  (Consider location/radiation/quality/duration/timing/severity/associated sxs/prior Treatment) HPI Per patient's daughter patient had diarrhea last week which was watery. However it resolved 5 days ago. Over the past 4-5 days she has had decreased appetite and acting sluggish. She had fever up to 102.9 last night with shaking. Today her care giver states her temp was 101.9. She started having a cough today. Daughter is concerned because she thinks the patient's throat is white. She also states the patient has had tearing for a long time. The granddaughter who lives in a house and her child have both had coughs.  PCP Zoe LanPenny Jones  Past Medical History  Diagnosis Date  . Dementia     worse since 2012  . Thrush   . Thyroid disease   . FTT (failure to thrive) in adult   . 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 SDH (subdural hematoma)     [facility] placed on keppra 250 mg bid    Past Surgical History  Procedure Laterality Date  . Other surgical history  04/21/2011    left hip internal fixation and reduction intertrochanteric and femoral neck  . Other surgical history  ~2 yrs ago    brain surgery   Family History  Problem Relation Age of Onset  . Hypertension      daughter 1(living)  . Suicidality      daughter 2   History  Substance Use Topics  . Smoking status: Former Smoker -- 1.00 packs/day for 40 years    Types: Cigarettes  . Smokeless tobacco: Never Used  . Alcohol Use: No   Lives at home Lives with daughter Has caregivers Nonambulatory over a year   OB History   Grav Para Term Preterm Abortions TAB SAB Ect Mult Living                 Review of Systems  Unable to perform ROS: Dementia      Allergies  Review of patient's allergies indicates no known  allergies.  Home Medications   Current Outpatient Rx  Name  Route  Sig  Dispense  Refill  . acetaminophen (TYLENOL) 325 MG tablet   Oral   Take 650 mg by mouth every 6 (six) hours as needed for headache.         Marland Kitchen. aspirin EC 81 MG tablet   Oral   Take 81 mg by mouth daily.         Marland Kitchen. donepezil (ARICEPT) 10 MG tablet   Oral   Take 10 mg by mouth every morning.          . hydrocortisone (CORTIZONE-10) 1 % ointment   Topical   Apply 1 application topically as needed for itching.         . levothyroxine (SYNTHROID, LEVOTHROID) 25 MCG tablet   Oral   Take 25 mcg by mouth every morning.          . Multiple Vitamin (MULTIVITAMIN WITH MINERALS) TABS   Oral   Take 1 tablet by mouth daily.          . potassium chloride (K-DUR) 10 MEQ tablet   Oral   Take 10 mEq by mouth 2 (two) times daily.         .Marland Kitchen  risperiDONE (RISPERDAL) 0.5 MG tablet   Oral   Take 0.5 mg by mouth 2 (two) times daily.         . vitamin C (ASCORBIC ACID) 500 MG tablet   Oral   Take 500 mg by mouth every evening.          BP 112/62  Pulse 85  Temp(Src) 98.9 F (37.2 C) (Oral)  Resp 20  SpO2 96%  Vital signs normal   Physical Exam  Nursing note and vitals reviewed. Constitutional: She appears well-developed and well-nourished.  Non-toxic appearance. She does not appear ill. No distress.  Pt stares, she does not respond verbally or by shaking her head  HENT:  Head: Normocephalic and atraumatic.  Right Ear: External ear normal.  Left Ear: External ear normal.  Nose: Nose normal. No mucosal edema or rhinorrhea.  Mouth/Throat: Mucous membranes are normal. No dental abscesses or uvula swelling.  Refuses to open her mouth, at one point she started to fall asleep and her mouth opened and I used a tongue blade and saw the distal third of her tongue which was mildly dry  Eyes: Conjunctivae and EOM are normal. Pupils are equal, round, and reactive to light.  Neck: Normal range of motion  and full passive range of motion without pain. Neck supple.  Cardiovascular: Normal rate, regular rhythm and normal heart sounds.  Exam reveals no gallop and no friction rub.   No murmur heard. Pulmonary/Chest: Effort normal and breath sounds normal. No respiratory distress. She has no wheezes. She has no rhonchi. She has no rales. She exhibits no tenderness and no crepitus.  Abdominal: Soft. Normal appearance and bowel sounds are normal. She exhibits no distension. There is no tenderness. There is no rebound and no guarding.  Musculoskeletal: She exhibits no edema and no tenderness.  Neurological: She is alert. She has normal strength. No cranial nerve deficit.  Skin: Skin is warm, dry and intact. No rash noted. No erythema. No pallor.  Psychiatric: Her affect is blunt. She is withdrawn. She is noncommunicative.  Flat affect    ED Course  Procedures (including critical care time)  Medications  0.9 %  sodium chloride infusion ( Intravenous New Bag/Given 12/23/13 1528)   Reviewed patient's lab tests with her family. At this point the source of her fever is unclear. We discussed possible admission and they are agreeable.  Nurse reports patient drank a large glass of orange juice without difficulty.  17:47  Dr Rama admit to med-surg, team 8. Antibiotics pending Dr York Ram evaluation.   Labs Review Results for orders placed during the hospital encounter of 12/23/13  TROPONIN I      Result Value Ref Range   Troponin I <0.30  <0.30 ng/mL  CBC WITH DIFFERENTIAL      Result Value Ref Range   WBC 19.4 (*) 4.0 - 10.5 K/uL   RBC 4.61  3.87 - 5.11 MIL/uL   Hemoglobin 13.0  12.0 - 15.0 g/dL   HCT 57.8  46.9 - 62.9 %   MCV 83.7  78.0 - 100.0 fL   MCH 28.2  26.0 - 34.0 pg   MCHC 33.7  30.0 - 36.0 g/dL   RDW 52.8  41.3 - 24.4 %   Platelets 286  150 - 400 K/uL   Neutrophils Relative % 76  43 - 77 %   Neutro Abs 14.6 (*) 1.7 - 7.7 K/uL   Lymphocytes Relative 14  12 - 46 %   Lymphs Abs 2.6  0.7  - 4.0 K/uL   Monocytes Relative 11  3 - 12 %   Monocytes Absolute 2.1 (*) 0.1 - 1.0 K/uL   Eosinophils Relative 0  0 - 5 %   Eosinophils Absolute 0.0  0.0 - 0.7 K/uL   Basophils Relative 0  0 - 1 %   Basophils Absolute 0.0  0.0 - 0.1 K/uL  COMPREHENSIVE METABOLIC PANEL      Result Value Ref Range   Sodium 138  137 - 147 mEq/L   Potassium 4.1  3.7 - 5.3 mEq/L   Chloride 102  96 - 112 mEq/L   CO2 23  19 - 32 mEq/L   Glucose, Bld 99  70 - 99 mg/dL   BUN 9  6 - 23 mg/dL   Creatinine, Ser 4.09  0.50 - 1.10 mg/dL   Calcium 9.4  8.4 - 81.1 mg/dL   Total Protein 8.1  6.0 - 8.3 g/dL   Albumin 2.6 (*) 3.5 - 5.2 g/dL   AST 12  0 - 37 U/L   ALT 9  0 - 35 U/L   Alkaline Phosphatase 99  39 - 117 U/L   Total Bilirubin 0.4  0.3 - 1.2 mg/dL   GFR calc non Af Amer 83 (*) >90 mL/min   GFR calc Af Amer >90  >90 mL/min  URINALYSIS, ROUTINE W REFLEX MICROSCOPIC      Result Value Ref Range   Color, Urine YELLOW  YELLOW   APPearance CLEAR  CLEAR   Specific Gravity, Urine 1.002 (*) 1.005 - 1.030   pH 5.5  5.0 - 8.0   Glucose, UA NEGATIVE  NEGATIVE mg/dL   Hgb urine dipstick NEGATIVE  NEGATIVE   Bilirubin Urine NEGATIVE  NEGATIVE   Ketones, ur NEGATIVE  NEGATIVE mg/dL   Protein, ur NEGATIVE  NEGATIVE mg/dL   Urobilinogen, UA 0.2  0.0 - 1.0 mg/dL   Nitrite NEGATIVE  NEGATIVE   Leukocytes, UA NEGATIVE  NEGATIVE   Laboratory interpretation all normal     Imaging Review Dg Chest Portable 1 View  12/23/2013   CLINICAL DATA:  Fatigue, dementia, failure to thrive.  EXAM: PORTABLE CHEST - 1 VIEW  COMPARISON:  DG CHEST 2 VIEW dated 09/14/2012  FINDINGS: Enlarged cardiomediastinal silhouette. No active infiltrates or failure. Osseous demineralization.  IMPRESSION: No active disease.  Stable chest.   Electronically Signed   By: Davonna Belling M.D.   On: 12/23/2013 16:03     EKG Interpretation   Date/Time:  Wednesday December 23 2013 13:48:57 EDT Ventricular Rate:  89 PR Interval:  124 QRS Duration:  89 QT Interval:  434 QTC Calculation: 528 R Axis:   -25 Text Interpretation:  Sinus rhythm Borderline left axis deviation  Borderline T abnormalities, anterior leads Prolonged QT interval Baseline  wander in lead(s) V3 No significant change since last tracing 14 Sep 2012  Confirmed by Auriana Scalia  MD-I, Zarif Rathje ([id number]) on 12/23/2013 3:49:06 PM      MDM   elderly patient with dementia presents with fever and cough with negative chest x-ray. She does have a moderately elevated white blood cell count. She is being  admitted for observation,  flu testing was sent.    Final diagnoses:  Fever  Bronchitis    Plan admission  Devoria Albe, MD, Franz Dell, MD 12/23/13 2128

## 2013-12-24 ENCOUNTER — Encounter (HOSPITAL_COMMUNITY): Payer: Self-pay | Admitting: *Deleted

## 2013-12-24 DIAGNOSIS — J690 Pneumonitis due to inhalation of food and vomit: Secondary | ICD-10-CM | POA: Diagnosis present

## 2013-12-24 DIAGNOSIS — F0390 Unspecified dementia without behavioral disturbance: Secondary | ICD-10-CM

## 2013-12-24 LAB — INFLUENZA PANEL BY PCR (TYPE A & B)
H1N1 flu by pcr: NOT DETECTED
INFLBPCR: NEGATIVE
Influenza A By PCR: NEGATIVE

## 2013-12-24 LAB — TSH: TSH: 5.034 u[IU]/mL — ABNORMAL HIGH (ref 0.350–4.500)

## 2013-12-24 LAB — LACTIC ACID, PLASMA: LACTIC ACID, VENOUS: 1.4 mmol/L (ref 0.5–2.2)

## 2013-12-24 MED ORDER — GUAIFENESIN-DM 100-10 MG/5ML PO SYRP
5.0000 mL | ORAL_SOLUTION | ORAL | Status: DC | PRN
  Administered 2013-12-24: 5 mL via ORAL
  Filled 2013-12-24: qty 10

## 2013-12-24 NOTE — Progress Notes (Signed)
PROGRESS NOTE   Kristi Webb YQI:[id number] DOB: 28-Nov-1931 DOA: 12/23/2013 PCP: Default, Provider, MD  Brief narrative: Kristi Webb is an 80 y.o. female with a PMH of dementia, non-ambulatory at baseline, who was admitted with fever and lethargy on 12/23/13. Chest radiography was initially clear and urinalysis was negative for signs of infection.  Assessment/Plan: Principal Problem:  Probable aspiration pneumonia with fever and leukocytosis Fever and chills likely secondary to aspiration pneumonia. Patient has a history of aspiration pneumonitis in the past, and has not been swallowing her food, so she could have an early aspiration pneumonia that has not shown up on radiography yet. Blood cultures have been sent. Urinalysis negative for nitrites and leukocytes. Could have a viral syndrome. Influenza panel negative. Continue empiric Levaquin and Flagyl to cover aspiration pneumonia, and Diflucan given her concurrent thrush. Her diarrhea seems to have resolved and she has not had any recent antibiotic exposure. If she has recurrent diarrhea, would obtain a GI pathogen panel and stool for C. difficile. Seen by speech therapist. Maryclare LabradorWe'll order a modified barium swallow. Repeat chest x-ray in the morning. Active Problems:  Dementia  Continue Aricept and when necessary Risperdal.  Thrush  Continue Diflucan.  Hypothyroidism  Continue Synthroid. TSH slightly elevated at 5.034.  DVT prophylaxis  Continue Lovenox.   Code Status: DNR  Family Communication: Daughter at bedside.  Disposition Plan: Home when stable.    IV access:  Peripheral IV  Medical Consultants:  None  Other Consultants:  None  Anti-infectives:  Levaquin 12/23/13--->  Flagyl 12/23/13--->  Diflucan 12/23/13--->  HPI/Subjective: Kristi Webb is more alert today. She is still running fevers up to 101. She is drinking well. Nonverbal.  Objective: Filed Vitals:   12/23/13 2145 12/24/13 0307 12/24/13 0520  12/24/13 0601  BP:   123/53   Pulse: 94  74   Temp: 101 F (38.3 C)  99 F (37.2 C)   TempSrc: Axillary Axillary Oral   Resp:   20   Height:      Weight:    73.9 kg (162 lb 14.7 oz)  SpO2:   100%     Intake/Output Summary (Last 24 hours) at 12/24/13 1422 Last data filed at 12/24/13 1110  Gross per 24 hour  Intake   2115 ml  Output      4 ml  Net   2111 ml    Exam: Gen:  NAD Cardiovascular:  RRR, No M/R/G Respiratory:  Lungs with scattered rales and rhonchi Gastrointestinal:  Abdomen soft, NT/ND, + BS Extremities:  No C/E/C  Data Reviewed: Basic Metabolic Panel:  Recent Labs Lab 12/23/13 1530  NA 138  K 4.1  CL 102  CO2 23  GLUCOSE 99  BUN 9  CREATININE 0.62  CALCIUM 9.4   GFR Estimated Creatinine Clearance: 57.6 ml/min (by C-G formula based on Cr of 0.62). Liver Function Tests:  Recent Labs Lab 12/23/13 1530  AST 12  ALT 9  ALKPHOS 99  BILITOT 0.4  PROT 8.1  ALBUMIN 2.6*   CBC:  Recent Labs Lab 12/23/13 1530  WBC 19.4*  NEUTROABS 14.6*  HGB 13.0  HCT 38.6  MCV 83.7  PLT 286   Cardiac Enzymes:  Recent Labs Lab 12/23/13 1530  TROPONINI <0.30   Thyroid function studies  Recent Labs  12/24/13 0315  TSH 5.034*   Microbiology Recent Results (from the past 240 hour(s))  CULTURE, BLOOD (ROUTINE X 2)     Status: None   Collection Time  12/23/13  4:50 PM      Result Value Ref Range Status   Specimen Description BLOOD LEFT HAND   Final   Special Requests BOTTLES DRAWN AEROBIC ONLY 3 CC BLUE   Final   Culture  Setup Time     Final   Value: 12/23/2013 22:12     Performed at [facility]   Culture     Final   Value:        BLOOD CULTURE RECEIVED NO GROWTH TO DATE CULTURE WILL BE HELD FOR 5 DAYS BEFORE ISSUING A FINAL NEGATIVE REPORT     Performed at [facility]   Report Status PENDING   Incomplete  CULTURE, BLOOD (ROUTINE X 2)     Status: None   Collection Time    12/23/13  4:55 PM      Result Value Ref Range  Status   Specimen Description BLOOD LEFT THUMB   Final   Special Requests BOTTLES DRAWN AEROBIC ONLY 3 CC BLUE   Final   Culture  Setup Time     Final   Value: 12/23/2013 23:58     Performed at [facility]   Culture     Final   Value:        BLOOD CULTURE RECEIVED NO GROWTH TO DATE CULTURE WILL BE HELD FOR 5 DAYS BEFORE ISSUING A FINAL NEGATIVE REPORT     Performed at [facility]   Report Status PENDING   Incomplete     Procedures and Diagnostic Studies: Dg Chest Portable 1 View  12/23/2013   CLINICAL DATA:  Fatigue, dementia, failure to thrive.  EXAM: PORTABLE CHEST - 1 VIEW  COMPARISON:  DG CHEST 2 VIEW dated 09/14/2012  FINDINGS: Enlarged cardiomediastinal silhouette. No active infiltrates or failure. Osseous demineralization.  IMPRESSION: No active disease.  Stable chest.   Electronically Signed   By: Davonna Belling M.D.   On: 12/23/2013 16:03    Scheduled Meds: . aspirin EC  81 mg Oral Daily  . donepezil  10 mg Oral q morning - 10a  . enoxaparin (LOVENOX) injection  40 mg Subcutaneous Q24H  . fluconazole (DIFLUCAN) IV  200 mg Intravenous Q24H  . levofloxacin (LEVAQUIN) IV  750 mg Intravenous Q24H  . levothyroxine  25 mcg Oral QAC breakfast  . metronidazole  500 mg Intravenous Q8H  . multivitamin with minerals  1 tablet Oral Daily  . potassium chloride  10 mEq Oral BID  . vitamin C  500 mg Oral QPM   Continuous Infusions: . sodium chloride 1,000 mL (12/24/13 0515)    Time spent: 35 minutes with > 50% of time discussing current diagnostic test results, clinical impression and plan of care.    LOS: 1 day   Walden Statz  [facility] Pager [phone]. If unable to reach me by pager, please call my cell phone at [phone].  *Please note that the hospitalists switch teams on Wednesdays. Please call the flow manager at [phone] if you are having difficulty reaching the hospitalist taking care of this patient as she can update you and provide the most  up-to-date pager number of provider caring for the patient. If 8PM-8AM, please contact night-coverage at [email or web address], password [facility]  12/24/2013, 2:22 PM    **Disclaimer: This note was dictated with voice recognition software. Similar sounding words can inadvertently be transcribed and this note may contain transcription errors which may not have been corrected upon publication of note.**

## 2013-12-24 NOTE — Plan of Care (Signed)
Problem: Phase I Progression Outcomes Goal: Hemodynamically stable Outcome: Progressing Had fever last pm. Prn tylenol given.

## 2013-12-24 NOTE — Progress Notes (Addendum)
Patient's IV site and r arm WNL at 2000 prior to hanging antibiotic levaquin. When checked on patient again around 2130 r forearm with redness (from iv to just about 2-3 inches below elbow) and patient rubbing it. IV flushing well and no signs of swelling. Appears to be flare reaction with antibiotic (antiobiotic not completely finished but almost). Applied hydrocortisone over arm. Other antiobiotic hung.  Currently arm WNL with no redness noted. Will monitor.

## 2013-12-24 NOTE — Plan of Care (Signed)
Problem: Phase II Progression Outcomes Goal: IV changed to normal saline lock Outcome: Progressing kvo ivf's and patient appetite and oral intake has improved

## 2013-12-24 NOTE — Plan of Care (Signed)
Problem: Phase I Progression Outcomes Goal: Hemodynamically stable Outcome: Progressing Low grade fever tonight prn tylenol given

## 2013-12-24 NOTE — Evaluation (Signed)
Clinical/Bedside Swallow Evaluation Patient Details  Name: Kristi Webb MRN: [id number] Date of Birth: [birth year]  Today's Date: 12/24/2013 Time: 0940-1000 SLP Time Calculation (min): 20 min  Past Medical History:  Past Medical History  Diagnosis Date  . Dementia     worse since 2012  . Thrush   . Thyroid disease   . FTT (failure to thrive) in adult   . 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 SDH (subdural hematoma)     [facility] placed on keppra 250 mg bid    Past Surgical History:  Past Surgical History  Procedure Laterality Date  . Other surgical history  04/21/2011    left hip internal fixation and reduction intertrochanteric and femoral neck  . Other surgical history  ~2 yrs ago    brain surgery   HPI:  80 year old female admitted with fever and chills of unclear origin. [facility] and had been evaluated for dysphagia - initially npo transiting to thin liquids. [facility] after aspirating water at home with recommendations for dysphagia 1 diet with nectar thick liquids. Patient has since been treated by a HH SLP and diet advanced to regular once recovered from acute illness.  MD questions an early aspiration pneumonia that has not shown up on radiography yet. CXR negative. Blood cultures have been sent. Urinalysis negative for nitrites and leukocytes. Influenza panel requested by MD. MD currently treating with Levaquin to cover aspiration pneumonia, Tamiflu to cover influenza, and Diflucan given her concurrent thrush.    Assessment / Plan / Recommendation Clinical Impression  Patient presents with what appears to be a functional oropharyngeal swallow without overt indication of aspiration at bedside. Baseline cough present. Min-moderate SLP cueing provided for timely A-P transit of bolus due to baseline mentation. Oral transit most improved with ability to self feed. Overall, appears to be appropriate to remain on thin  liquids. Not observed with soft or regular texture solids due to current dietary restrictions however daughter reports generally good intake of regular diet without overt indication of aspiration prior to admission when questioned. Discussed general aspiration precautions as well as chronic risk of aspiration,  which may be episodic  in nature given patient's baseline mental status which includes decreased attention, awareness, and problem solving.  SLP will f/u briefly for diagnostic treatment with upgraded solid textures once cleared from a GI standpoint by MD. MD, if feel necessary to r/o silent aspiration in light of history and possible admitting diagnosis (CXR negative currently), please order MBS. Otherwise, SLP will f/u at bedside.     Aspiration Risk  Moderate    Diet Recommendation Thin liquid (clear liquids, SLP will f/u for advancement )   Liquid Administration via: Cup;Straw Medication Administration: Crushed with puree Supervision: Patient able to self feed;Full supervision/cueing for compensatory strategies Compensations: Slow rate;Small sips/bites Postural Changes and/or Swallow Maneuvers: Seated upright 90 degrees (allow patient to self feed)    Other  Recommendations Oral Care Recommendations: Oral care BID   Follow Up Recommendations  None    Frequency and Duration min 2x/week  1 week   Pertinent Vitals/Pain n/a         Swallow Study    General HPI: 80 year old female admitted with fever and chills of unclear origin. [facility] and had been evaluated for dysphagia - initially npo transiting to thin liquids. [facility] after aspirating water  at home with recommendations for dysphagia 1 diet with nectar thick liquids. Patient has since been treated by a HH SLP and diet advanced to regular once recovered from acute illness.  MD questions an early aspiration pneumonia that has not shown up on radiography yet. CXR  negative. Blood cultures have been sent. Urinalysis negative for nitrites and leukocytes. Influenza panel requested by MD. MD currently treating with Levaquin to cover aspiration pneumonia, Tamiflu to cover influenza, and Diflucan given her concurrent thrush.  Type of Study: Bedside swallow evaluation Previous Swallow Assessment: see HPI Diet Prior to this Study: Thin liquids (clear liquids) Temperature Spikes Noted: No Respiratory Status: Room air History of Recent Intubation: No Behavior/Cognition: Alert;Cooperative;Pleasant mood;Requires cueing;Doesn't follow directions;Decreased sustained attention (non-verbal) Self-Feeding Abilities: Able to feed self;Needs assist Patient Positioning: Upright in bed Baseline Vocal Quality:  (non-verbal for this SLP) Volitional Cough: Cognitively unable to elicit Volitional Swallow: Unable to elicit    Oral/Motor/Sensory Function Overall Oral Motor/Sensory Function: Other (comment) (unable to formally assess due to AMS, appears functional)   Ice Chips Ice chips: Not tested   Thin Liquid Thin Liquid: Impaired Presentation: Cup;Self Fed;Straw Oral Phase Impairments: Impaired anterior to posterior transit Oral Phase Functional Implications: Prolonged oral transit;Oral holding    Nectar Thick Nectar Thick Liquid: Not tested   Honey Thick Honey Thick Liquid: Not tested   Puree Puree: Impaired Presentation: Self Fed;Spoon Oral Phase Impairments: Impaired anterior to posterior transit Oral Phase Functional Implications: Prolonged oral transit   Solid   GO   Kristi Wermuth MA, CCC-SLP [phone]  Solid: Not tested       Kristi Webb Kristi Webb 12/24/2013,10:17 AM

## 2013-12-24 NOTE — Plan of Care (Signed)
Problem: Phase I Progression Outcomes Goal: Voiding-avoid urinary catheter unless indicated Outcome: Completed/Met Date Met:  12/24/13 Incontinent

## 2013-12-24 NOTE — Care Management Note (Signed)
   CARE MANAGEMENT NOTE 12/24/2013  Patient:  Gretta ArabWOODS,Zennie F   Account Number:  [id number]  Date Initiated:  12/24/2013  Documentation initiated by:  Larren Copes  Subjective/Objective Assessment:   80 yo female admitted with fever of unknown origin. Suspected aspiration PNA vs viral syndrome     Action/Plan:   Home when stable   Anticipated DC Date:     Anticipated DC Plan:  HOME W HOME HEALTH SERVICES  In-house referral  NA      DC Planning Services  CM consult      Choice offered to / List presented to:  NA   DME arranged  NA      DME agency  NA     HH arranged  NA      HH agency  NA   Status of service:  In process, will continue to follow Medicare Important Message given?   (If response is "NO", the following Medicare IM given date fields will be blank) Date Medicare IM given:   Date Additional Medicare IM given:    Discharge Disposition:    Per UR Regulation:  Reviewed for med. necessity/level of care/duration of stay  If discussed at Long Length of Stay Meetings, dates discussed:    Comments:  12/24/13 1237 Roxy Mannsymeeka Lashanna Angelo,MSN,RN [phone] Chart reviewed for utilization of services. Patient bedbound, resides home with daughter who provides home care with assistance of pricate duty services. No needs assessed at this time.

## 2013-12-25 ENCOUNTER — Inpatient Hospital Stay (HOSPITAL_COMMUNITY): Payer: Medicare Other

## 2013-12-25 DIAGNOSIS — R627 Adult failure to thrive: Secondary | ICD-10-CM

## 2013-12-25 LAB — CBC
HCT: 33.9 % — ABNORMAL LOW (ref 36.0–46.0)
Hemoglobin: 11.1 g/dL — ABNORMAL LOW (ref 12.0–15.0)
MCH: 27.3 pg (ref 26.0–34.0)
MCHC: 32.7 g/dL (ref 30.0–36.0)
MCV: 83.3 fL (ref 78.0–100.0)
PLATELETS: 283 10*3/uL (ref 150–400)
RBC: 4.07 MIL/uL (ref 3.87–5.11)
RDW: 14 % (ref 11.5–15.5)
WBC: 12.1 10*3/uL — ABNORMAL HIGH (ref 4.0–10.5)

## 2013-12-25 LAB — BASIC METABOLIC PANEL
BUN: 9 mg/dL (ref 6–23)
CALCIUM: 8.7 mg/dL (ref 8.4–10.5)
CO2: 23 mEq/L (ref 19–32)
Chloride: 108 mEq/L (ref 96–112)
Creatinine, Ser: 0.59 mg/dL (ref 0.50–1.10)
GFR calc Af Amer: >90 mL/min (ref >90)
GFR calc non Af Amer: 84 mL/min — ABNORMAL LOW (ref >90)
Glucose, Bld: 111 mg/dL — ABNORMAL HIGH (ref 70–99)
Potassium: 4 mEq/L (ref 3.7–5.3)
Sodium: 143 mEq/L (ref 137–147)

## 2013-12-25 NOTE — Care Management Note (Signed)
Cm attempted to consult with patient concerning MD order for HH/ equipment needs. Patient currently down for swallow eval. Cm telephoned pt's daughter/caretaker Levora DredgeVickye Forrey at [phone]. Vm left with Cm contact information. Will continue to follow.    Sharmon Leydenymeeka Chrystal Zeimet, MSN,RN [phone]

## 2013-12-25 NOTE — Progress Notes (Signed)
PROGRESS NOTE   Kristi Webb ZOX:[id number] DOB: 1931-10-28 DOA: 12/23/2013 PCP: Default, Provider, MD  Brief narrative: Kristi Webb is an 80 y.o. female with a PMH of dementia, non-ambulatory at baseline, who was admitted with fever and lethargy on 12/23/13. Chest radiography was initially clear and urinalysis was negative for signs of infection.  Assessment/Plan: Principal Problem:  Probable aspiration pneumonia with fever and leukocytosis Fever and chills likely secondary to aspiration pneumonia. Patient has a history of aspiration pneumonitis in the past, and has not been swallowing her food, so she could have an early aspiration pneumonia that has not shown up on radiography yet. Blood cultures have been sent. Urinalysis negative for nitrites and leukocytes. Could have a viral syndrome. Influenza panel negative. Continue empiric Levaquin and Flagyl to cover aspiration pneumonia, and Diflucan given her concurrent thrush. Her diarrhea seems to have resolved and she has not had any recent antibiotic exposure. If she has recurrent diarrhea, would obtain a GI pathogen panel and stool for C. difficile. Seen by speech therapist with plans for a modified barium swallow to evaluate for silent aspiration. Repeat chest x-ray done this morning and is read as mild cardiomegaly with pulmonary venous congestion with no focal pulmonary infiltrates, though I personally reviewed the films and there is an infiltrate in the right lower lung. Fever curve down on empiric antibiotics. White blood cell count also improved 19.4---> 12.1. Active Problems:  Dementia  Continue Aricept and when necessary Risperdal.  Thrush  Continue Diflucan.  Hypothyroidism  Continue Synthroid. TSH slightly elevated at 5.034.  DVT prophylaxis  Continue Lovenox.   Code Status: DNR  Family Communication: Daughter at bedside.  Disposition Plan: Home when stable.    IV access:  Peripheral IV  Medical  Consultants:  None  Other Consultants:  None  Anti-infectives:  Levaquin 12/23/13--->  Flagyl 12/23/13--->  Diflucan 12/23/13--->  HPI/Subjective: Kristi Webb is nonverbal, but fever curve is down and family reports that she is eating better.  Objective: Filed Vitals:   12/24/13 2200 12/24/13 2201 12/25/13 0115 12/25/13 0650  BP:  96/70  112/86  Pulse: 84 87    Temp:  99.9 F (37.7 C) 98.4 F (36.9 C) 99.2 F (37.3 C)  TempSrc:  Oral Oral Oral  Resp:  20  20  Height:      Weight:      SpO2:  95%  96%    Intake/Output Summary (Last 24 hours) at 12/25/13 0803 Last data filed at 12/25/13 8119  Gross per 24 hour  Intake   1377 ml  Output      0 ml  Net   1377 ml    Exam: Gen:  NAD Cardiovascular:  RRR, No M/R/G Respiratory:  Lungs clear today Gastrointestinal:  Abdomen soft, NT/ND, + BS Extremities:  No C/E/C  Data Reviewed: Basic Metabolic Panel:  Recent Labs Lab 12/23/13 1530 12/25/13 0315  NA 138 143  K 4.1 4.0  CL 102 108  CO2 23 23  GLUCOSE 99 111*  BUN 9 9  CREATININE 0.62 0.59  CALCIUM 9.4 8.7   GFR Estimated Creatinine Clearance: 57.6 ml/min (by C-G formula based on Cr of 0.59). Liver Function Tests:  Recent Labs Lab 12/23/13 1530  AST 12  ALT 9  ALKPHOS 99  BILITOT 0.4  PROT 8.1  ALBUMIN 2.6*   CBC:  Recent Labs Lab 12/23/13 1530 12/25/13 0315  WBC 19.4* 12.1*  NEUTROABS 14.6*  --   HGB 13.0 11.1*  HCT 38.6 33.9*  MCV 83.7 83.3  PLT 286 283   Cardiac Enzymes:  Recent Labs Lab 12/23/13 1530  TROPONINI <0.30   Thyroid function studies  Recent Labs  12/24/13 0315  TSH 5.034*   Microbiology Recent Results (from the past 240 hour(s))  CULTURE, BLOOD (ROUTINE X 2)     Status: None   Collection Time    12/23/13  4:50 PM      Result Value Ref Range Status   Specimen Description BLOOD LEFT HAND   Final   Special Requests BOTTLES DRAWN AEROBIC ONLY 3 CC BLUE   Final   Culture  Setup Time     Final   Value:  12/23/2013 22:12     Performed at [facility]   Culture     Final   Value:        BLOOD CULTURE RECEIVED NO GROWTH TO DATE CULTURE WILL BE HELD FOR 5 DAYS BEFORE ISSUING A FINAL NEGATIVE REPORT     Performed at [facility]   Report Status PENDING   Incomplete  CULTURE, BLOOD (ROUTINE X 2)     Status: None   Collection Time    12/23/13  4:55 PM      Result Value Ref Range Status   Specimen Description BLOOD LEFT THUMB   Final   Special Requests BOTTLES DRAWN AEROBIC ONLY 3 CC BLUE   Final   Culture  Setup Time     Final   Value: 12/23/2013 23:58     Performed at [facility]   Culture     Final   Value:        BLOOD CULTURE RECEIVED NO GROWTH TO DATE CULTURE WILL BE HELD FOR 5 DAYS BEFORE ISSUING A FINAL NEGATIVE REPORT     Performed at [facility]   Report Status PENDING   Incomplete     Procedures and Diagnostic Studies: Dg Chest Portable 1 View  12/23/2013   CLINICAL DATA:  Fatigue, dementia, failure to thrive.  EXAM: PORTABLE CHEST - 1 VIEW  COMPARISON:  DG CHEST 2 VIEW dated 09/14/2012  FINDINGS: Enlarged cardiomediastinal silhouette. No active infiltrates or failure. Osseous demineralization.  IMPRESSION: No active disease.  Stable chest.   Electronically Signed   By: Davonna Belling M.D.   On: 12/23/2013 16:03    Scheduled Meds: . aspirin EC  81 mg Oral Daily  . donepezil  10 mg Oral q morning - 10a  . enoxaparin (LOVENOX) injection  40 mg Subcutaneous Q24H  . fluconazole (DIFLUCAN) IV  200 mg Intravenous Q24H  . levofloxacin (LEVAQUIN) IV  750 mg Intravenous Q24H  . levothyroxine  25 mcg Oral QAC breakfast  . metronidazole  500 mg Intravenous Q8H  . multivitamin with minerals  1 tablet Oral Daily  . potassium chloride  10 mEq Oral BID  . vitamin C  500 mg Oral QPM   Continuous Infusions: . sodium chloride 10 mL/hr (12/24/13 1848)    Time spent: 25 minutes.    LOS: 2 days   Noami Bove  [facility] Pager [phone]. If  unable to reach me by pager, please call my cell phone at [phone].  *Please note that the hospitalists switch teams on Wednesdays. Please call the flow manager at [phone] if you are having difficulty reaching the hospitalist taking care of this patient as she can update you and provide the most up-to-date pager number of provider caring for the patient. If 8PM-8AM, please contact night-coverage at  [email or web address], password [email or web address]  12/25/2013, 8:03 AM    **Disclaimer: This note was dictated with voice recognition software. Similar sounding words can inadvertently be transcribed and this note may contain transcription errors which may not have been corrected upon publication of note.**

## 2013-12-25 NOTE — Procedures (Signed)
Objective Swallowing Evaluation: Modified Barium Swallowing Study  Patient Details  Name: Kristi Webb MRN: [id number] Date of Birth: 1931/10/02  Today's Date: 12/25/2013 Time: 1410-1435 SLP Time Calculation (min): 25 min  Past Medical History:  Past Medical History  Diagnosis Date  . Dementia     worse since 2012  . Thrush   . Thyroid disease   . FTT (failure to thrive) in adult   . 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 SDH (subdural hematoma)     [facility] placed on keppra 250 mg bid    Past Surgical History:  Past Surgical History  Procedure Laterality Date  . Other surgical history  04/21/2011    left hip internal fixation and reduction intertrochanteric and femoral neck  . Other surgical history  ~2 yrs ago    brain surgery   HPI:  80 year old female admitted with fever and chills of unclear origin. Patient [facility]/2012 and had been evaluated for dysphagia - initially npo transiting to thin liquids. [facility] after aspirating water at home with recommendations for dysphagia 1 diet with nectar thick liquids. Patient has since been treated by a HH SLP and diet advanced to regular once recovered from acute illness.  MD questions an early aspiration pneumonia that has not shown up on radiography yet. CXR negative. Blood cultures have been sent. Urinalysis negative for nitrites and leukocytes. Influenza panel requested by MD. MD currently treating with Levaquin to cover aspiration pneumonia, Tamiflu to cover influenza, and Diflucan given her concurrent thrush.  Plan for MBS today due to concerns for possible silent aspiration.  Per MD recent CXR showed right lower lobe infiltrate.       Assessment / Plan / Recommendation Clinical Impression  Dysphagia Diagnosis: Mild oral phase dysphagia;Mild pharyngeal phase dysphagia  Pt with mild oropharyngeal dysphagia without aspiration of any consistency tested.  Mild delay in oral  transiting with lingual pumping due to decreased oral control/weakness results in premature spillage of barium into pharynx.  "Munching" mastication pattern noted with solids due to cognition but was functional.      Pharyngeal swallow reflex is delayed albeit very strong without residuals.  Barium pooling noted due to pharyngeal sensory deficits at vallecular space spilling toward pyriform sinus with liquids.    There was no aspiration or laryngeal penetration during testing.  Educated caregiver Eber JonesCarolyn to findings, compensation strategies to mitigate possible episodic aspiration given mild deficits and cognition.  Pt reports pt self feeds at home and does not cough with intake.    Recommend continue regular/thin diet as tolerated.  SLP to follow up x1 for tolerance/education with daughter as she was not present today.      Treatment Recommendation  Therapy as outlined in treatment plan below    Diet Recommendation Regular;Thin liquid   Liquid Administration via: Cup;Straw Medication Administration: Whole meds with puree Supervision: Patient able to self feed;Intermittent supervision to cue for compensatory strategies Compensations: Small sips/bites;Check for pocketing;Slow rate Postural Changes and/or Swallow Maneuvers: Seated upright 90 degrees;Upright 30-60 min after meal    Other  Recommendations Oral Care Recommendations: Oral care BID   Follow Up Recommendations  None    Frequency and Duration min 2x/week  1 week   Pertinent Vitals/Pain Afebrile, decreased    General HPI: 80 year old female admitted with fever and chills of unclear origin. [facility] and had been evaluated for  dysphagia - initially npo transiting to thin liquids. Admitted to [facility] 07/2012 after aspirating water at home with recommendations for dysphagia 1 diet with nectar thick liquids. Patient has since been treated by a HH SLP and diet advanced to regular once recovered  from acute illness.  MD questions an early aspiration pneumonia that has not shown up on radiography yet. CXR negative. Blood cultures have been sent. Urinalysis negative for nitrites and leukocytes. Influenza panel requested by MD. MD currently treating with Levaquin to cover aspiration pneumonia, Tamiflu to cover influenza, and Diflucan given her concurrent thrush.  Plan for MBS today due to concerns for possible silent aspiration.  Per MD recent CXR showed right lower lobe infiltrate.   Type of Study: Modified Barium Swallowing Study Reason for Referral: Objectively evaluate swallowing function Diet Prior to this Study: Regular;Thin liquids Respiratory Status: Room air History of Recent Intubation: No Behavior/Cognition: Alert;Cooperative;Pleasant mood;Requires cueing;Doesn't follow directions;Decreased sustained attention (non-verbal) Oral Cavity - Dentition:  (dentures) Oral Motor / Sensory Function: Impaired - see Bedside swallow eval Self-Feeding Abilities: Able to feed self;Needs assist Baseline Vocal Quality: Other (comment) (non-verbal for this SLP) Volitional Cough: Cognitively unable to elicit Volitional Swallow: Unable to elicit Anatomy: Within functional limits Pharyngeal Secretions: Not observed secondary MBS    Reason for Referral Objectively evaluate swallowing function   Oral Phase Oral Preparation/Oral Phase Oral Phase: Impaired Oral Phase - Comment Oral Phase - Comment: mild delay in oral transiting, munching mastication pattern consistent with dementia/dysphagia   Pharyngeal Phase Pharyngeal Phase Pharyngeal Phase: Impaired Pharyngeal - Nectar Pharyngeal - Nectar Cup: Delayed swallow initiation;Premature spillage to valleculae Pharyngeal - Nectar Straw: Delayed swallow initiation;Premature spillage to valleculae Pharyngeal - Thin Pharyngeal - Thin Cup: Delayed swallow initiation;Premature spillage to valleculae;Premature spillage to pyriform sinuses Pharyngeal - Thin  Straw: Delayed swallow initiation;Premature spillage to valleculae Pharyngeal - Solids Pharyngeal - Puree: Delayed swallow initiation;Premature spillage to valleculae Pharyngeal - Regular: Delayed swallow initiation;Premature spillage to valleculae  Cervical Esophageal Phase    GO    Cervical Esophageal Phase Cervical Esophageal Phase: WFL Appearance of minimal delay in clearance distally - radiologist not present to confirm.           Mickie Bail [place], [place] [facility] SLP [phone]

## 2013-12-25 NOTE — Progress Notes (Signed)
Order for MBS received, planned today for approximately 1400.  Thanks for the order. Donavan Burnetamara Bettymae Yott, MS [facility] SLP [phone]

## 2013-12-26 ENCOUNTER — Inpatient Hospital Stay (HOSPITAL_COMMUNITY): Payer: Medicare Other

## 2013-12-26 DIAGNOSIS — E46 Unspecified protein-calorie malnutrition: Secondary | ICD-10-CM

## 2013-12-26 MED ORDER — FLEET ENEMA 7-19 GM/118ML RE ENEM
1.0000 | ENEMA | Freq: Every day | RECTAL | Status: DC | PRN
  Administered 2013-12-26: 1 via RECTAL
  Filled 2013-12-26: qty 1

## 2013-12-26 MED ORDER — BISACODYL 5 MG PO TBEC: 5.0000 mg | DELAYED_RELEASE_TABLET | Freq: Every day | ORAL | Status: DC | PRN

## 2013-12-26 MED ORDER — NYSTATIN 100000 UNIT/ML MT SUSP: 5.0000 mL | Freq: Four times a day (QID) | OROMUCOSAL | Status: DC

## 2013-12-26 MED ORDER — BISACODYL 10 MG RE SUPP
10.0000 mg | Freq: Every day | RECTAL | Status: DC | PRN
  Administered 2013-12-26: 10 mg via RECTAL
  Filled 2013-12-26: qty 1

## 2013-12-26 MED ORDER — AMOXICILLIN-POT CLAVULANATE 875-125 MG PO TABS: 1.0000 | ORAL_TABLET | Freq: Two times a day (BID) | ORAL | Status: DC

## 2013-12-26 NOTE — Evaluation (Signed)
Physical Therapy Evaluation Patient Details Name: Kristi Webb MRN: [id number] DOB: 1932/03/18 Today's Date: 12/26/2013   History of Present Illness  Kristi Webb is an 80 y.o. female with a PMH of dementia, non-ambulatory at baseline, who presents with her family who provide the history as the patient is nonverbal, with a two-day history of fever. Patient's family reports that last week she had 3 days of watery stools, 3-4 times per day but that this stopped on 12/17/13. Patient's family gradually increased her diet, but she continued to have poor oral intake. On 12/21/13, they started her on regular foods but reported she would close her mouth and refused to open it. The family notes that she has had fevers up to 102F. She has denied pain by shaking her head no when asked. She has had some coughing today. They report rigors. He only other complaint is watery eyes. Patient lives with her daughter and provides the care at night, and she has nursing assistance to provide her care during the day.  Clinical Impression  Pt able to follow some commands with incr time; dtr requests new hospital bed and they would benefit from a ramp at home as it is quite difficult to get her in and out using a wheel  because they have multiple steps    Follow Up Recommendations Home health PT;Supervision/Assistance - 24 hour    Equipment Recommendations  Other (comment) (dtr requests new hospital bed)    Recommendations for Other Services       Precautions / Restrictions Precautions Precautions: Fall       Mobility  Bed Mobility Overal bed mobility: +2 for physical assistance;Needs Assistance Bed Mobility: Supine to Sit;Sit to Supine;Rolling Rolling: +2 for physical assistance;Max assist   Supine to sit: +2 for physical assistance;Total assist Sit to supine: +2 for physical assistance;Total assist   General bed mobility comments: pt able to participate with RLE and to reach for bedrail with hand over  hand facitlitation  Transfers                    Ambulation/Gait                Stairs            Wheelchair Mobility    Modified Rankin (Stroke Patients Only)       Balance Overall balance assessment: Needs assistance Sitting-balance support: Single extremity supported;No upper extremity supported Sitting balance-Leahy Scale: Poor Sitting balance - Comments: sat EOB x 10 min Postural control: Right lateral lean                           Pertinent Vitals/Pain     Home Living Family/patient expects to be discharged to:: Private residence Living Arrangements: Children;Other relatives Available Help at Discharge: Family Type of Home: House Home Access: Stairs to enter   [employer] of Steps: needs ramp Home Layout: One level Home Equipment: Hospital bed Additional Comments: pt dtr and grand dtr; dtr states hospital bed is  very used    Prior Function Level of Independence: Needs assistance   Gait / Transfers Assistance Needed: non amb; assist with transfers           Hand Dominance        Extremity/Trunk Assessment               Lower Extremity Assessment: LLE deficits/detail;RLE deficits/detail RLE Deficits / Details: AAROM grossly WFL LLE Deficits /  Details: resistant to imposed movement, able to get knee flexion to 90degrees in sitting with incr time allowed     Communication   Communication:  (non verbal)  Cognition Arousal/Alertness: Awake/alert Behavior During Therapy: Flat affect Overall Cognitive Status: History of cognitive impairments - at baseline                      General Comments      Exercises        Assessment/Plan    PT Assessment Patient needs continued PT services  PT Diagnosis Generalized weakness   PT Problem List Decreased activity tolerance;Decreased balance;Decreased mobility  PT Treatment Interventions Therapeutic activities;Therapeutic exercise;Functional  mobility training   PT Goals (Current goals can be found in the Care Plan section) Acute Rehab PT Goals Patient Stated Goal: dtr really wants to get HHPT and HHOT  restarted PT Goal Formulation: With patient Time For Goal Achievement: 01/02/14 Potential to Achieve Goals: Fair    Frequency Min 2X/week   Barriers to discharge        End of Session   Activity Tolerance: Patient limited by fatigue Patient left: in bed;with family/visitor present;with call bell/phone within reach         Time: 1242-1313 PT Time Calculation (min): 31 min   Charges:   PT Evaluation $Initial PT Evaluation Tier I: 1 Procedure PT Treatments $Therapeutic Activity: 23-37 mins   PT G Codes:          Lucia Harm 12/26/2013, 1:50 PM

## 2013-12-26 NOTE — Progress Notes (Signed)
Infused levaquin over 2 hours tonight and "redness" localized to just above IV site and wrist area not all down forearm (like last night did).  Resolution of redness once medication completed and IV flushed with NS. Hydrocortisone prn helped as well.

## 2013-12-26 NOTE — Discharge Summary (Signed)
Physician Discharge Summary  Kristi Webb ZOX:[id number] DOB: 1932/03/08 DOA: 12/23/2013  PCP: Iona Hansen, NP  Admit date: 12/23/2013 Discharge date: 12/26/2013  Recommendations for Outpatient Follow-up:  1. F/U with PCP in 1 week or sooner if abdominal symptoms persist (suspect abdominal discomfort either from Flagyl or sequelae of viral gastroenteritis). 2. TSH slightly high.  Recommend repeat TSH in 4-6 weeks. 3. Home health RN, Aide, PT and OT set up.  Discharge Diagnoses:  Principal Problem:    Aspiration pneumonia Active Problems:    Leukocytosis    Dementia    Thrush    FUO (fever of unknown origin)    Hypothyroidism    Constipation   Discharge Condition: Improved.  Diet recommendation: Regular.  History of present illness:  Kristi Webb is an 80 y.o. female with a PMH of dementia, non-ambulatory at baseline, who was admitted with fever and lethargy on 12/23/13. Chest radiography was initially clear and urinalysis was negative for signs of infection.  Hospital Course by problem:  Principal Problem:  Probable aspiration pneumonia with fever and leukocytosis  Fever and chills likely secondary to aspiration pneumonia. Patient has a history of aspiration pneumonitis in the past, and has not been swallowing her food, so she could have an early aspiration pneumonia that has not shown up on radiography yet. Blood cultures have been sent. Urinalysis negative for nitrites and leukocytes. Could have a viral syndrome. Influenza panel negative. Continue empiric Levaquin and Flagyl to cover aspiration pneumonia, and Diflucan given her concurrent thrush. Her diarrhea is resolved. Seen by speech therapist, status post modified barium swallow to evaluate for silent aspiration which was relatively normal. Fever curve down on empiric antibiotics. White blood cell count also improved 19.4---> 12.1. We'll discharge home on Augmentin to complete her treatment course. Active Problems:   Abdominal pain / constipation Patient's daughter reports that she winces when she sat up and that she is tender in the abdominal area. We obtained a KUB prior to discharge which showed constipation. RN asked to give fleets enema or dulcolax suppository and to ensure a BM prior to discharge. Dementia  Continue Aricept and when necessary Risperdal.  Thrush  Treated with Diflucan. We'll send home with a prescription for nystatin to use as needed. Hypothyroidism  Continue Synthroid. TSH slightly elevated at 5.034.    Procedures:  None  Consultations:  None  Discharge Exam: Filed Vitals:   12/26/13 0608  BP:   Pulse:   Temp: 99.5 F (37.5 C)  Resp:    Filed Vitals:   12/25/13 2201 12/25/13 2303 12/26/13 0500 12/26/13 0608  BP: 148/85  131/77   Pulse: 85  79   Temp: 100.2 F (37.9 C) 99.5 F (37.5 C) 100.2 F (37.9 C) 99.5 F (37.5 C)  TempSrc: Oral Oral Oral Oral  Resp: 20  20   Height:      Weight:      SpO2: 100%  98%     Gen:  NAD Cardiovascular:  RRR, No M/R/G Respiratory: Lungs CTAB Gastrointestinal: Abdomen soft, tender to deep palpation with normal active bowel sounds. Extremities: No C/E/C   Discharge Instructions     Medication List         acetaminophen 325 MG tablet  Commonly known as:  TYLENOL  Take 650 mg by mouth every 6 (six) hours as needed for headache.     amoxicillin-clavulanate 875-125 MG per tablet  Commonly known as:  AUGMENTIN  Take 1 tablet by mouth 2 (two)  times daily.     aspirin EC 81 MG tablet  Take 81 mg by mouth daily.     CORTIZONE-10 1 % ointment  Generic drug:  hydrocortisone  Apply 1 application topically as needed for itching.     donepezil 10 MG tablet  Commonly known as:  ARICEPT  Take 10 mg by mouth every morning.     levothyroxine 25 MCG tablet  Commonly known as:  SYNTHROID, LEVOTHROID  Take 25 mcg by mouth every morning.     multivitamin with minerals Tabs tablet  Take 1 tablet by mouth daily.      nystatin 100000 UNIT/ML suspension  Commonly known as:  MYCOSTATIN  Take 5 mLs (500,000 Units total) by mouth 4 (four) times daily.     potassium chloride 10 MEQ tablet  Commonly known as:  K-DUR  Take 10 mEq by mouth 2 (two) times daily.     risperiDONE 0.5 MG tablet  Commonly known as:  RISPERDAL  Take 0.5 mg by mouth 2 (two) times daily.     vitamin C 500 MG tablet  Commonly known as:  ASCORBIC ACID  Take 500 mg by mouth every evening.          The results of significant diagnostics from this hospitalization (including imaging, microbiology, ancillary and laboratory) are listed below for reference.    Significant Diagnostic Studies: Dg Chest Port 1 View  12/25/2013   CLINICAL DATA:  Pneumonia.  EXAM: PORTABLE CHEST - 1 VIEW  COMPARISON:  DG CHEST 1V PORT dated 12/23/2013  FINDINGS: Mediastinum and hilar structures normal. Mild cardiomegaly and pulmonary venous congestion noted. No focal alveolar infiltrates. No pleural effusion or pneumothorax. No acute osseus abnormality.  IMPRESSION: 1. Mild cardiomegaly and pulmonary venous congestion. 2. No focal pulmonary infiltrate.   Electronically Signed   By: Maisie Fus  Register   On: 12/25/2013 08:02   Dg Chest Portable 1 View  12/23/2013   CLINICAL DATA:  Fatigue, dementia, failure to thrive.  EXAM: PORTABLE CHEST - 1 VIEW  COMPARISON:  DG CHEST 2 VIEW dated 09/14/2012  FINDINGS: Enlarged cardiomediastinal silhouette. No active infiltrates or failure. Osseous demineralization.  IMPRESSION: No active disease.  Stable chest.   Electronically Signed   By: Davonna Belling M.D.   On: 12/23/2013 16:03   Dg Swallowing Func-speech Pathology  12/25/2013   [name], CCC-SLP     12/25/2013  5:11 PM Objective Swallowing Evaluation: Modified Barium Swallowing Study   Patient Details  Name: Kristi Webb MRN: [id number] Date of Birth: 1932/06/08  Today's Date: 12/25/2013 Time: 1410-1435 SLP Time Calculation (min): 25 min  Past Medical History:  Past  Medical History  Diagnosis Date  . Dementia     worse since 2012  . Thrush   . Thyroid disease   . FTT (failure to thrive) in adult   . 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 SDH (subdural hematoma)     b/l 11/2011 at [facility] placed on keppra 250 mg bid    Past Surgical History:  Past Surgical History  Procedure Laterality Date  . Other surgical history  04/21/2011    left hip internal fixation and reduction intertrochanteric and  femoral neck  . Other surgical history  ~2 yrs ago    brain surgery   HPI:  80 year old female admitted with fever and chills of unclear  origin. Patient admitted to [facility] 06/2012 and had  been evaluated for dysphagia - initially npo transiting  to thin  liquids. Admitted to [facility] 07/2012 after aspirating water  at home with recommendations for dysphagia 1 diet with nectar  thick liquids. Patient has since been treated by a HH SLP and  diet advanced to regular once recovered from acute illness.  MD  questions an early aspiration pneumonia that has not shown up on  radiography yet. CXR negative. Blood cultures have been sent.  Urinalysis negative for nitrites and leukocytes. Influenza panel  requested by MD. MD currently treating with Levaquin to cover  aspiration pneumonia, Tamiflu to cover influenza, and Diflucan  given her concurrent thrush.  Plan for MBS today due to concerns  for possible silent aspiration.  Per MD recent CXR showed right  lower lobe infiltrate.       Assessment / Plan / Recommendation Clinical Impression  Dysphagia Diagnosis: Mild oral phase dysphagia;Mild pharyngeal  phase dysphagia  Pt with mild oropharyngeal dysphagia without aspiration of any  consistency tested.  Mild delay in oral transiting with lingual  pumping due to decreased oral control/weakness results in  premature spillage of barium into pharynx.  "Munching"  mastication pattern noted with solids due to cognition but was  functional.      Pharyngeal swallow reflex is delayed albeit very strong  without  residuals.  Barium pooling noted due to pharyngeal sensory  deficits at vallecular space spilling toward pyriform sinus with  liquids.    There was no aspiration or laryngeal penetration during testing.   Educated caregiver Eber Jones to findings, compensation strategies  to mitigate possible episodic aspiration given mild deficits and  cognition.  Pt reports pt self feeds at home and does not cough  with intake.    Recommend continue regular/thin diet as tolerated.  SLP to follow  up x1 for tolerance/education with daughter as she was not  present today.      Treatment Recommendation  Therapy as outlined in treatment plan below    Diet Recommendation Regular;Thin liquid   Liquid Administration via: Cup;Straw Medication Administration: Whole meds with puree Supervision: Patient able to self feed;Intermittent supervision  to cue for compensatory strategies Compensations: Small sips/bites;Check for pocketing;Slow rate Postural Changes and/or Swallow Maneuvers: Seated upright 90  degrees;Upright 30-60 min after meal    Other  Recommendations Oral Care Recommendations: Oral care BID   Follow Up Recommendations  None    Frequency and Duration min 2x/week  1 week   Pertinent Vitals/Pain Afebrile, decreased    General HPI: 80 year old female admitted with fever and chills of  unclear origin. Patient admitted to [facility] 06/2012  and had been evaluated for dysphagia - initially npo transiting  to thin liquids. Admitted to [facility] 07/2012 after aspirating  water at home with recommendations for dysphagia 1 diet with  nectar thick liquids. Patient has since been treated by a HH SLP  and diet advanced to regular once recovered from acute illness.   MD questions an early aspiration pneumonia that has not shown up  on radiography yet. CXR negative. Blood cultures have been sent.  Urinalysis negative for nitrites and leukocytes. Influenza panel  requested by MD. MD currently treating with Levaquin to cover   aspiration pneumonia, Tamiflu to cover influenza, and Diflucan  given her concurrent thrush.  Plan for MBS today due to concerns  for possible silent aspiration.  Per MD recent CXR showed right  lower lobe infiltrate.   Type of Study: Modified Barium Swallowing Study Reason for Referral: Objectively evaluate swallowing function  Diet Prior to this Study: Regular;Thin liquids Respiratory Status: Room air History of Recent Intubation: No Behavior/Cognition: Alert;Cooperative;Pleasant mood;Requires  cueing;Doesn't follow directions;Decreased sustained attention  (non-verbal) Oral Cavity - Dentition:  (dentures) Oral Motor / Sensory Function: Impaired - see Bedside swallow  eval Self-Feeding Abilities: Able to feed self;Needs assist Baseline Vocal Quality: Other (comment) (non-verbal for this SLP) Volitional Cough: Cognitively unable to elicit Volitional Swallow: Unable to elicit Anatomy: Within functional limits Pharyngeal Secretions: Not observed secondary MBS    Reason for Referral Objectively evaluate swallowing function   Oral Phase Oral Preparation/Oral Phase Oral Phase: Impaired Oral Phase - Comment Oral Phase - Comment: mild delay in oral transiting, munching  mastication pattern consistent with dementia/dysphagia   Pharyngeal Phase Pharyngeal Phase Pharyngeal Phase: Impaired Pharyngeal - Nectar Pharyngeal - Nectar Cup: Delayed swallow initiation;Premature  spillage to valleculae Pharyngeal - Nectar Straw: Delayed swallow initiation;Premature  spillage to valleculae Pharyngeal - Thin Pharyngeal - Thin Cup: Delayed swallow initiation;Premature  spillage to valleculae;Premature spillage to pyriform sinuses Pharyngeal - Thin Straw: Delayed swallow initiation;Premature  spillage to valleculae Pharyngeal - Solids Pharyngeal - Puree: Delayed swallow initiation;Premature spillage  to valleculae Pharyngeal - Regular: Delayed swallow initiation;Premature  spillage to valleculae  Cervical Esophageal Phase    GO    Cervical  Esophageal Phase Cervical Esophageal Phase: WFL Appearance of minimal delay in clearance distally - radiologist  not present to confirm.           Mickie Bail [place], [place] [facility] SLP [phone]     Labs:  Basic Metabolic Panel:  Recent Labs Lab 12/23/13 1530 12/25/13 0315  NA 138 143  K 4.1 4.0  CL 102 108  CO2 23 23  GLUCOSE 99 111*  BUN 9 9  CREATININE 0.62 0.59  CALCIUM 9.4 8.7   GFR Estimated Creatinine Clearance: 57.6 ml/min (by C-G formula based on Cr of 0.59). Liver Function Tests:  Recent Labs Lab 12/23/13 1530  AST 12  ALT 9  ALKPHOS 99  BILITOT 0.4  PROT 8.1  ALBUMIN 2.6*    CBC:  Recent Labs Lab 12/23/13 1530 12/25/13 0315  WBC 19.4* 12.1*  NEUTROABS 14.6*  --   HGB 13.0 11.1*  HCT 38.6 33.9*  MCV 83.7 83.3  PLT 286 283   Cardiac Enzymes:  Recent Labs Lab 12/23/13 1530  TROPONINI <0.30   Thyroid function studies  Recent Labs  12/24/13 0315  TSH 5.034*   Microbiology Recent Results (from the past 240 hour(s))  CULTURE, BLOOD (ROUTINE X 2)     Status: None   Collection Time    12/23/13  4:50 PM      Result Value Ref Range Status   Specimen Description BLOOD LEFT HAND   Final   Special Requests BOTTLES DRAWN AEROBIC ONLY 3 CC BLUE   Final   Culture  Setup Time     Final   Value: 12/23/2013 22:12     Performed at [facility]   Culture     Final   Value:        BLOOD CULTURE RECEIVED NO GROWTH TO DATE CULTURE WILL BE HELD FOR 5 DAYS BEFORE ISSUING A FINAL NEGATIVE REPORT     Performed at [facility]   Report Status PENDING   Incomplete  CULTURE, BLOOD (ROUTINE X 2)     Status: None   Collection Time    12/23/13  4:55 PM      Result Value Ref Range Status   Specimen  Description BLOOD LEFT THUMB   Final   Special Requests BOTTLES DRAWN AEROBIC ONLY 3 CC BLUE   Final   Culture  Setup Time     Final   Value: 12/23/2013 23:58     [facility] at [facility]   Culture     Final   Value:         BLOOD CULTURE RECEIVED NO GROWTH TO DATE CULTURE WILL BE HELD FOR 5 DAYS BEFORE ISSUING A FINAL NEGATIVE REPORT     Performed at [facility]   Report Status PENDING   Incomplete    Time coordinating discharge: 35 minutes.  Signed:  Camila Maita  Pager [phone] [facility] 12/26/2013, 10:58 AM

## 2013-12-26 NOTE — Progress Notes (Signed)
12/26/2013 1200 NCM spoke to pt's dtr, Shona SimpsonVicky Stensland. [facility]. Dtr states they have DME at home. Isidoro DonningAlesia Keghan Mcfarren RN CCM Case Mgmt phone [phone]

## 2013-12-26 NOTE — Progress Notes (Signed)
Patient grimacing when palpated LLQ area of abdomen. Patient's LBM 3/26. Will monitor.

## 2013-12-26 NOTE — Progress Notes (Signed)
OT Cancellation Note  Patient Details Name: Kristi Webb MRN: [id number] DOB: 10/24/1931   Cancelled Treatment:    Reason Eval/Treat Not Completed: Other (comment) Per nursing, pt has been able to feed herself though slowly. Per daughter, PTA pt was able to wash UB but needed assist with LB bathing, dressing, and all toileting from bed level. No skilled acute OT needs at this time.  Lennox LaityStone, Kendahl Bumgardner [place] [phone] 12/26/2013, 11:48 AM

## 2013-12-26 NOTE — Plan of Care (Signed)
Problem: Phase II Progression Outcomes Goal: Vital signs remain stable Outcome: Progressing Patient with low grade fevers overnight.

## 2013-12-26 NOTE — Progress Notes (Signed)
Pt discharged home via [facility] in stable condition. Discharge instructions given to patient's daughter (caregiver). Scripts sent to pharmacy of choice.

## 2013-12-28 DIAGNOSIS — F0390 Unspecified dementia without behavioral disturbance: Secondary | ICD-10-CM | POA: Diagnosis not present

## 2013-12-28 DIAGNOSIS — E079 Disorder of thyroid, unspecified: Secondary | ICD-10-CM | POA: Diagnosis not present

## 2013-12-28 DIAGNOSIS — M6281 Muscle weakness (generalized): Secondary | ICD-10-CM | POA: Diagnosis not present

## 2013-12-28 DIAGNOSIS — E039 Hypothyroidism, unspecified: Secondary | ICD-10-CM | POA: Diagnosis not present

## 2013-12-28 DIAGNOSIS — R627 Adult failure to thrive: Secondary | ICD-10-CM | POA: Diagnosis not present

## 2013-12-28 DIAGNOSIS — J690 Pneumonitis due to inhalation of food and vomit: Secondary | ICD-10-CM | POA: Diagnosis not present

## 2013-12-28 DIAGNOSIS — D72829 Elevated white blood cell count, unspecified: Secondary | ICD-10-CM | POA: Diagnosis not present

## 2013-12-29 DIAGNOSIS — J690 Pneumonitis due to inhalation of food and vomit: Secondary | ICD-10-CM | POA: Diagnosis not present

## 2013-12-29 DIAGNOSIS — M6281 Muscle weakness (generalized): Secondary | ICD-10-CM | POA: Diagnosis not present

## 2013-12-29 DIAGNOSIS — D72829 Elevated white blood cell count, unspecified: Secondary | ICD-10-CM | POA: Diagnosis not present

## 2013-12-29 DIAGNOSIS — F0390 Unspecified dementia without behavioral disturbance: Secondary | ICD-10-CM | POA: Diagnosis not present

## 2013-12-29 DIAGNOSIS — R627 Adult failure to thrive: Secondary | ICD-10-CM | POA: Diagnosis not present

## 2013-12-29 DIAGNOSIS — E079 Disorder of thyroid, unspecified: Secondary | ICD-10-CM | POA: Diagnosis not present

## 2013-12-29 LAB — CULTURE, BLOOD (ROUTINE X 2)
Culture: NO GROWTH
Culture: NO GROWTH

## 2013-12-30 DIAGNOSIS — F0390 Unspecified dementia without behavioral disturbance: Secondary | ICD-10-CM | POA: Diagnosis not present

## 2013-12-30 DIAGNOSIS — M6281 Muscle weakness (generalized): Secondary | ICD-10-CM | POA: Diagnosis not present

## 2013-12-30 DIAGNOSIS — D72829 Elevated white blood cell count, unspecified: Secondary | ICD-10-CM | POA: Diagnosis not present

## 2013-12-30 DIAGNOSIS — J690 Pneumonitis due to inhalation of food and vomit: Secondary | ICD-10-CM | POA: Diagnosis not present

## 2013-12-30 DIAGNOSIS — E079 Disorder of thyroid, unspecified: Secondary | ICD-10-CM | POA: Diagnosis not present

## 2013-12-30 DIAGNOSIS — R627 Adult failure to thrive: Secondary | ICD-10-CM | POA: Diagnosis not present

## 2013-12-31 DIAGNOSIS — R627 Adult failure to thrive: Secondary | ICD-10-CM | POA: Diagnosis not present

## 2013-12-31 DIAGNOSIS — E079 Disorder of thyroid, unspecified: Secondary | ICD-10-CM | POA: Diagnosis not present

## 2013-12-31 DIAGNOSIS — F0390 Unspecified dementia without behavioral disturbance: Secondary | ICD-10-CM | POA: Diagnosis not present

## 2013-12-31 DIAGNOSIS — J690 Pneumonitis due to inhalation of food and vomit: Secondary | ICD-10-CM | POA: Diagnosis not present

## 2013-12-31 DIAGNOSIS — D72829 Elevated white blood cell count, unspecified: Secondary | ICD-10-CM | POA: Diagnosis not present

## 2013-12-31 DIAGNOSIS — M6281 Muscle weakness (generalized): Secondary | ICD-10-CM | POA: Diagnosis not present

## 2014-01-01 DIAGNOSIS — E079 Disorder of thyroid, unspecified: Secondary | ICD-10-CM | POA: Diagnosis not present

## 2014-01-01 DIAGNOSIS — R627 Adult failure to thrive: Secondary | ICD-10-CM | POA: Diagnosis not present

## 2014-01-01 DIAGNOSIS — J690 Pneumonitis due to inhalation of food and vomit: Secondary | ICD-10-CM | POA: Diagnosis not present

## 2014-01-01 DIAGNOSIS — D72829 Elevated white blood cell count, unspecified: Secondary | ICD-10-CM | POA: Diagnosis not present

## 2014-01-01 DIAGNOSIS — F0390 Unspecified dementia without behavioral disturbance: Secondary | ICD-10-CM | POA: Diagnosis not present

## 2014-01-01 DIAGNOSIS — M6281 Muscle weakness (generalized): Secondary | ICD-10-CM | POA: Diagnosis not present

## 2014-01-04 DIAGNOSIS — E079 Disorder of thyroid, unspecified: Secondary | ICD-10-CM | POA: Diagnosis not present

## 2014-01-04 DIAGNOSIS — D72829 Elevated white blood cell count, unspecified: Secondary | ICD-10-CM | POA: Diagnosis not present

## 2014-01-04 DIAGNOSIS — J690 Pneumonitis due to inhalation of food and vomit: Secondary | ICD-10-CM | POA: Diagnosis not present

## 2014-01-04 DIAGNOSIS — R627 Adult failure to thrive: Secondary | ICD-10-CM | POA: Diagnosis not present

## 2014-01-04 DIAGNOSIS — M6281 Muscle weakness (generalized): Secondary | ICD-10-CM | POA: Diagnosis not present

## 2014-01-04 DIAGNOSIS — F0390 Unspecified dementia without behavioral disturbance: Secondary | ICD-10-CM | POA: Diagnosis not present

## 2014-01-05 DIAGNOSIS — E079 Disorder of thyroid, unspecified: Secondary | ICD-10-CM | POA: Diagnosis not present

## 2014-01-05 DIAGNOSIS — M6281 Muscle weakness (generalized): Secondary | ICD-10-CM | POA: Diagnosis not present

## 2014-01-05 DIAGNOSIS — J690 Pneumonitis due to inhalation of food and vomit: Secondary | ICD-10-CM | POA: Diagnosis not present

## 2014-01-05 DIAGNOSIS — D72829 Elevated white blood cell count, unspecified: Secondary | ICD-10-CM | POA: Diagnosis not present

## 2014-01-05 DIAGNOSIS — F0390 Unspecified dementia without behavioral disturbance: Secondary | ICD-10-CM | POA: Diagnosis not present

## 2014-01-05 DIAGNOSIS — R627 Adult failure to thrive: Secondary | ICD-10-CM | POA: Diagnosis not present

## 2014-01-07 DIAGNOSIS — R627 Adult failure to thrive: Secondary | ICD-10-CM | POA: Diagnosis not present

## 2014-01-07 DIAGNOSIS — M6281 Muscle weakness (generalized): Secondary | ICD-10-CM | POA: Diagnosis not present

## 2014-01-07 DIAGNOSIS — J690 Pneumonitis due to inhalation of food and vomit: Secondary | ICD-10-CM | POA: Diagnosis not present

## 2014-01-07 DIAGNOSIS — D72829 Elevated white blood cell count, unspecified: Secondary | ICD-10-CM | POA: Diagnosis not present

## 2014-01-07 DIAGNOSIS — F0390 Unspecified dementia without behavioral disturbance: Secondary | ICD-10-CM | POA: Diagnosis not present

## 2014-01-07 DIAGNOSIS — E079 Disorder of thyroid, unspecified: Secondary | ICD-10-CM | POA: Diagnosis not present

## 2014-01-08 DIAGNOSIS — R627 Adult failure to thrive: Secondary | ICD-10-CM | POA: Diagnosis not present

## 2014-01-08 DIAGNOSIS — E079 Disorder of thyroid, unspecified: Secondary | ICD-10-CM | POA: Diagnosis not present

## 2014-01-08 DIAGNOSIS — J690 Pneumonitis due to inhalation of food and vomit: Secondary | ICD-10-CM | POA: Diagnosis not present

## 2014-01-08 DIAGNOSIS — D72829 Elevated white blood cell count, unspecified: Secondary | ICD-10-CM | POA: Diagnosis not present

## 2014-01-08 DIAGNOSIS — F0390 Unspecified dementia without behavioral disturbance: Secondary | ICD-10-CM | POA: Diagnosis not present

## 2014-01-08 DIAGNOSIS — M6281 Muscle weakness (generalized): Secondary | ICD-10-CM | POA: Diagnosis not present

## 2014-01-11 DIAGNOSIS — D72829 Elevated white blood cell count, unspecified: Secondary | ICD-10-CM | POA: Diagnosis not present

## 2014-01-11 DIAGNOSIS — F0390 Unspecified dementia without behavioral disturbance: Secondary | ICD-10-CM | POA: Diagnosis not present

## 2014-01-11 DIAGNOSIS — R627 Adult failure to thrive: Secondary | ICD-10-CM | POA: Diagnosis not present

## 2014-01-11 DIAGNOSIS — M6281 Muscle weakness (generalized): Secondary | ICD-10-CM | POA: Diagnosis not present

## 2014-01-11 DIAGNOSIS — J690 Pneumonitis due to inhalation of food and vomit: Secondary | ICD-10-CM | POA: Diagnosis not present

## 2014-01-11 DIAGNOSIS — E079 Disorder of thyroid, unspecified: Secondary | ICD-10-CM | POA: Diagnosis not present

## 2014-01-13 DIAGNOSIS — D72829 Elevated white blood cell count, unspecified: Secondary | ICD-10-CM | POA: Diagnosis not present

## 2014-01-13 DIAGNOSIS — E079 Disorder of thyroid, unspecified: Secondary | ICD-10-CM | POA: Diagnosis not present

## 2014-01-13 DIAGNOSIS — R627 Adult failure to thrive: Secondary | ICD-10-CM | POA: Diagnosis not present

## 2014-01-13 DIAGNOSIS — J690 Pneumonitis due to inhalation of food and vomit: Secondary | ICD-10-CM | POA: Diagnosis not present

## 2014-01-13 DIAGNOSIS — F0390 Unspecified dementia without behavioral disturbance: Secondary | ICD-10-CM | POA: Diagnosis not present

## 2014-01-13 DIAGNOSIS — M6281 Muscle weakness (generalized): Secondary | ICD-10-CM | POA: Diagnosis not present

## 2014-01-14 DIAGNOSIS — J690 Pneumonitis due to inhalation of food and vomit: Secondary | ICD-10-CM | POA: Diagnosis not present

## 2014-01-14 DIAGNOSIS — F0390 Unspecified dementia without behavioral disturbance: Secondary | ICD-10-CM | POA: Diagnosis not present

## 2014-01-14 DIAGNOSIS — E079 Disorder of thyroid, unspecified: Secondary | ICD-10-CM | POA: Diagnosis not present

## 2014-01-14 DIAGNOSIS — M6281 Muscle weakness (generalized): Secondary | ICD-10-CM | POA: Diagnosis not present

## 2014-01-14 DIAGNOSIS — R627 Adult failure to thrive: Secondary | ICD-10-CM | POA: Diagnosis not present

## 2014-01-14 DIAGNOSIS — D72829 Elevated white blood cell count, unspecified: Secondary | ICD-10-CM | POA: Diagnosis not present

## 2014-01-18 DIAGNOSIS — M6281 Muscle weakness (generalized): Secondary | ICD-10-CM | POA: Diagnosis not present

## 2014-01-18 DIAGNOSIS — J690 Pneumonitis due to inhalation of food and vomit: Secondary | ICD-10-CM | POA: Diagnosis not present

## 2014-01-18 DIAGNOSIS — E079 Disorder of thyroid, unspecified: Secondary | ICD-10-CM | POA: Diagnosis not present

## 2014-01-18 DIAGNOSIS — F0390 Unspecified dementia without behavioral disturbance: Secondary | ICD-10-CM | POA: Diagnosis not present

## 2014-01-18 DIAGNOSIS — R627 Adult failure to thrive: Secondary | ICD-10-CM | POA: Diagnosis not present

## 2014-01-18 DIAGNOSIS — D72829 Elevated white blood cell count, unspecified: Secondary | ICD-10-CM | POA: Diagnosis not present

## 2014-01-21 DIAGNOSIS — E079 Disorder of thyroid, unspecified: Secondary | ICD-10-CM | POA: Diagnosis not present

## 2014-01-21 DIAGNOSIS — F0390 Unspecified dementia without behavioral disturbance: Secondary | ICD-10-CM | POA: Diagnosis not present

## 2014-01-21 DIAGNOSIS — D72829 Elevated white blood cell count, unspecified: Secondary | ICD-10-CM | POA: Diagnosis not present

## 2014-01-21 DIAGNOSIS — R627 Adult failure to thrive: Secondary | ICD-10-CM | POA: Diagnosis not present

## 2014-01-21 DIAGNOSIS — M6281 Muscle weakness (generalized): Secondary | ICD-10-CM | POA: Diagnosis not present

## 2014-01-21 DIAGNOSIS — J690 Pneumonitis due to inhalation of food and vomit: Secondary | ICD-10-CM | POA: Diagnosis not present

## 2014-01-23 DIAGNOSIS — E079 Disorder of thyroid, unspecified: Secondary | ICD-10-CM | POA: Diagnosis not present

## 2014-01-23 DIAGNOSIS — F0390 Unspecified dementia without behavioral disturbance: Secondary | ICD-10-CM | POA: Diagnosis not present

## 2014-01-23 DIAGNOSIS — J690 Pneumonitis due to inhalation of food and vomit: Secondary | ICD-10-CM | POA: Diagnosis not present

## 2014-01-23 DIAGNOSIS — D72829 Elevated white blood cell count, unspecified: Secondary | ICD-10-CM | POA: Diagnosis not present

## 2014-01-23 DIAGNOSIS — M6281 Muscle weakness (generalized): Secondary | ICD-10-CM | POA: Diagnosis not present

## 2014-01-23 DIAGNOSIS — R627 Adult failure to thrive: Secondary | ICD-10-CM | POA: Diagnosis not present

## 2014-01-25 DIAGNOSIS — F0390 Unspecified dementia without behavioral disturbance: Secondary | ICD-10-CM | POA: Diagnosis not present

## 2014-01-25 DIAGNOSIS — E079 Disorder of thyroid, unspecified: Secondary | ICD-10-CM | POA: Diagnosis not present

## 2014-01-25 DIAGNOSIS — M6281 Muscle weakness (generalized): Secondary | ICD-10-CM | POA: Diagnosis not present

## 2014-01-25 DIAGNOSIS — D72829 Elevated white blood cell count, unspecified: Secondary | ICD-10-CM | POA: Diagnosis not present

## 2014-01-25 DIAGNOSIS — R627 Adult failure to thrive: Secondary | ICD-10-CM | POA: Diagnosis not present

## 2014-01-25 DIAGNOSIS — J690 Pneumonitis due to inhalation of food and vomit: Secondary | ICD-10-CM | POA: Diagnosis not present

## 2014-01-27 DIAGNOSIS — R627 Adult failure to thrive: Secondary | ICD-10-CM | POA: Diagnosis not present

## 2014-01-27 DIAGNOSIS — F0390 Unspecified dementia without behavioral disturbance: Secondary | ICD-10-CM | POA: Diagnosis not present

## 2014-01-27 DIAGNOSIS — M6281 Muscle weakness (generalized): Secondary | ICD-10-CM | POA: Diagnosis not present

## 2014-01-27 DIAGNOSIS — E079 Disorder of thyroid, unspecified: Secondary | ICD-10-CM | POA: Diagnosis not present

## 2014-01-27 DIAGNOSIS — D72829 Elevated white blood cell count, unspecified: Secondary | ICD-10-CM | POA: Diagnosis not present

## 2014-01-27 DIAGNOSIS — J690 Pneumonitis due to inhalation of food and vomit: Secondary | ICD-10-CM | POA: Diagnosis not present

## 2014-01-28 DIAGNOSIS — F0390 Unspecified dementia without behavioral disturbance: Secondary | ICD-10-CM | POA: Diagnosis not present

## 2014-01-28 DIAGNOSIS — J690 Pneumonitis due to inhalation of food and vomit: Secondary | ICD-10-CM | POA: Diagnosis not present

## 2014-01-28 DIAGNOSIS — E079 Disorder of thyroid, unspecified: Secondary | ICD-10-CM | POA: Diagnosis not present

## 2014-01-28 DIAGNOSIS — M6281 Muscle weakness (generalized): Secondary | ICD-10-CM | POA: Diagnosis not present

## 2014-01-28 DIAGNOSIS — D72829 Elevated white blood cell count, unspecified: Secondary | ICD-10-CM | POA: Diagnosis not present

## 2014-01-28 DIAGNOSIS — R627 Adult failure to thrive: Secondary | ICD-10-CM | POA: Diagnosis not present

## 2014-02-02 DIAGNOSIS — F0390 Unspecified dementia without behavioral disturbance: Secondary | ICD-10-CM | POA: Diagnosis not present

## 2014-02-02 DIAGNOSIS — D72829 Elevated white blood cell count, unspecified: Secondary | ICD-10-CM | POA: Diagnosis not present

## 2014-02-02 DIAGNOSIS — R627 Adult failure to thrive: Secondary | ICD-10-CM | POA: Diagnosis not present

## 2014-02-02 DIAGNOSIS — E079 Disorder of thyroid, unspecified: Secondary | ICD-10-CM | POA: Diagnosis not present

## 2014-02-02 DIAGNOSIS — M6281 Muscle weakness (generalized): Secondary | ICD-10-CM | POA: Diagnosis not present

## 2014-02-02 DIAGNOSIS — J690 Pneumonitis due to inhalation of food and vomit: Secondary | ICD-10-CM | POA: Diagnosis not present

## 2014-02-03 DIAGNOSIS — R627 Adult failure to thrive: Secondary | ICD-10-CM | POA: Diagnosis not present

## 2014-02-03 DIAGNOSIS — J690 Pneumonitis due to inhalation of food and vomit: Secondary | ICD-10-CM | POA: Diagnosis not present

## 2014-02-03 DIAGNOSIS — M6281 Muscle weakness (generalized): Secondary | ICD-10-CM | POA: Diagnosis not present

## 2014-02-03 DIAGNOSIS — F0390 Unspecified dementia without behavioral disturbance: Secondary | ICD-10-CM | POA: Diagnosis not present

## 2014-02-03 DIAGNOSIS — D72829 Elevated white blood cell count, unspecified: Secondary | ICD-10-CM | POA: Diagnosis not present

## 2014-02-03 DIAGNOSIS — E079 Disorder of thyroid, unspecified: Secondary | ICD-10-CM | POA: Diagnosis not present

## 2014-02-04 DIAGNOSIS — R21 Rash and other nonspecific skin eruption: Secondary | ICD-10-CM | POA: Diagnosis not present

## 2014-02-04 DIAGNOSIS — E039 Hypothyroidism, unspecified: Secondary | ICD-10-CM | POA: Diagnosis not present

## 2014-02-04 DIAGNOSIS — E876 Hypokalemia: Secondary | ICD-10-CM | POA: Insufficient documentation

## 2014-02-04 DIAGNOSIS — J189 Pneumonia, unspecified organism: Secondary | ICD-10-CM | POA: Diagnosis not present

## 2014-02-05 DIAGNOSIS — F0390 Unspecified dementia without behavioral disturbance: Secondary | ICD-10-CM | POA: Diagnosis not present

## 2014-02-05 DIAGNOSIS — E079 Disorder of thyroid, unspecified: Secondary | ICD-10-CM | POA: Diagnosis not present

## 2014-02-05 DIAGNOSIS — J690 Pneumonitis due to inhalation of food and vomit: Secondary | ICD-10-CM | POA: Diagnosis not present

## 2014-02-05 DIAGNOSIS — R627 Adult failure to thrive: Secondary | ICD-10-CM | POA: Diagnosis not present

## 2014-02-05 DIAGNOSIS — D72829 Elevated white blood cell count, unspecified: Secondary | ICD-10-CM | POA: Diagnosis not present

## 2014-02-05 DIAGNOSIS — M6281 Muscle weakness (generalized): Secondary | ICD-10-CM | POA: Diagnosis not present

## 2014-02-09 DIAGNOSIS — J690 Pneumonitis due to inhalation of food and vomit: Secondary | ICD-10-CM | POA: Diagnosis not present

## 2014-02-09 DIAGNOSIS — R627 Adult failure to thrive: Secondary | ICD-10-CM | POA: Diagnosis not present

## 2014-02-09 DIAGNOSIS — F0390 Unspecified dementia without behavioral disturbance: Secondary | ICD-10-CM | POA: Diagnosis not present

## 2014-02-09 DIAGNOSIS — M6281 Muscle weakness (generalized): Secondary | ICD-10-CM | POA: Diagnosis not present

## 2014-02-09 DIAGNOSIS — D72829 Elevated white blood cell count, unspecified: Secondary | ICD-10-CM | POA: Diagnosis not present

## 2014-02-09 DIAGNOSIS — E079 Disorder of thyroid, unspecified: Secondary | ICD-10-CM | POA: Diagnosis not present

## 2014-02-10 DIAGNOSIS — F0390 Unspecified dementia without behavioral disturbance: Secondary | ICD-10-CM | POA: Diagnosis not present

## 2014-02-10 DIAGNOSIS — D72829 Elevated white blood cell count, unspecified: Secondary | ICD-10-CM | POA: Diagnosis not present

## 2014-02-10 DIAGNOSIS — R627 Adult failure to thrive: Secondary | ICD-10-CM | POA: Diagnosis not present

## 2014-02-10 DIAGNOSIS — J690 Pneumonitis due to inhalation of food and vomit: Secondary | ICD-10-CM | POA: Diagnosis not present

## 2014-02-10 DIAGNOSIS — M6281 Muscle weakness (generalized): Secondary | ICD-10-CM | POA: Diagnosis not present

## 2014-02-10 DIAGNOSIS — E079 Disorder of thyroid, unspecified: Secondary | ICD-10-CM | POA: Diagnosis not present

## 2014-02-11 DIAGNOSIS — R627 Adult failure to thrive: Secondary | ICD-10-CM | POA: Diagnosis not present

## 2014-02-11 DIAGNOSIS — M6281 Muscle weakness (generalized): Secondary | ICD-10-CM | POA: Diagnosis not present

## 2014-02-11 DIAGNOSIS — J690 Pneumonitis due to inhalation of food and vomit: Secondary | ICD-10-CM | POA: Diagnosis not present

## 2014-02-11 DIAGNOSIS — F0390 Unspecified dementia without behavioral disturbance: Secondary | ICD-10-CM | POA: Diagnosis not present

## 2014-02-11 DIAGNOSIS — E079 Disorder of thyroid, unspecified: Secondary | ICD-10-CM | POA: Diagnosis not present

## 2014-02-11 DIAGNOSIS — D72829 Elevated white blood cell count, unspecified: Secondary | ICD-10-CM | POA: Diagnosis not present

## 2014-02-16 DIAGNOSIS — F0390 Unspecified dementia without behavioral disturbance: Secondary | ICD-10-CM | POA: Diagnosis not present

## 2014-02-16 DIAGNOSIS — E079 Disorder of thyroid, unspecified: Secondary | ICD-10-CM | POA: Diagnosis not present

## 2014-02-16 DIAGNOSIS — J690 Pneumonitis due to inhalation of food and vomit: Secondary | ICD-10-CM | POA: Diagnosis not present

## 2014-02-16 DIAGNOSIS — D72829 Elevated white blood cell count, unspecified: Secondary | ICD-10-CM | POA: Diagnosis not present

## 2014-02-16 DIAGNOSIS — M6281 Muscle weakness (generalized): Secondary | ICD-10-CM | POA: Diagnosis not present

## 2014-02-16 DIAGNOSIS — R627 Adult failure to thrive: Secondary | ICD-10-CM | POA: Diagnosis not present

## 2014-02-17 DIAGNOSIS — R627 Adult failure to thrive: Secondary | ICD-10-CM | POA: Diagnosis not present

## 2014-02-17 DIAGNOSIS — M6281 Muscle weakness (generalized): Secondary | ICD-10-CM | POA: Diagnosis not present

## 2014-02-17 DIAGNOSIS — J690 Pneumonitis due to inhalation of food and vomit: Secondary | ICD-10-CM | POA: Diagnosis not present

## 2014-02-17 DIAGNOSIS — E079 Disorder of thyroid, unspecified: Secondary | ICD-10-CM | POA: Diagnosis not present

## 2014-02-17 DIAGNOSIS — D72829 Elevated white blood cell count, unspecified: Secondary | ICD-10-CM | POA: Diagnosis not present

## 2014-02-17 DIAGNOSIS — F0390 Unspecified dementia without behavioral disturbance: Secondary | ICD-10-CM | POA: Diagnosis not present

## 2014-02-18 DIAGNOSIS — E079 Disorder of thyroid, unspecified: Secondary | ICD-10-CM | POA: Diagnosis not present

## 2014-02-18 DIAGNOSIS — D72829 Elevated white blood cell count, unspecified: Secondary | ICD-10-CM | POA: Diagnosis not present

## 2014-02-18 DIAGNOSIS — M6281 Muscle weakness (generalized): Secondary | ICD-10-CM | POA: Diagnosis not present

## 2014-02-18 DIAGNOSIS — R627 Adult failure to thrive: Secondary | ICD-10-CM | POA: Diagnosis not present

## 2014-02-18 DIAGNOSIS — J690 Pneumonitis due to inhalation of food and vomit: Secondary | ICD-10-CM | POA: Diagnosis not present

## 2014-02-18 DIAGNOSIS — F0390 Unspecified dementia without behavioral disturbance: Secondary | ICD-10-CM | POA: Diagnosis not present

## 2014-02-22 DIAGNOSIS — E079 Disorder of thyroid, unspecified: Secondary | ICD-10-CM | POA: Diagnosis not present

## 2014-02-22 DIAGNOSIS — M6281 Muscle weakness (generalized): Secondary | ICD-10-CM | POA: Diagnosis not present

## 2014-02-22 DIAGNOSIS — D72829 Elevated white blood cell count, unspecified: Secondary | ICD-10-CM | POA: Diagnosis not present

## 2014-02-22 DIAGNOSIS — R627 Adult failure to thrive: Secondary | ICD-10-CM | POA: Diagnosis not present

## 2014-02-22 DIAGNOSIS — F0390 Unspecified dementia without behavioral disturbance: Secondary | ICD-10-CM | POA: Diagnosis not present

## 2014-02-22 DIAGNOSIS — J690 Pneumonitis due to inhalation of food and vomit: Secondary | ICD-10-CM | POA: Diagnosis not present

## 2014-02-23 DIAGNOSIS — E079 Disorder of thyroid, unspecified: Secondary | ICD-10-CM | POA: Diagnosis not present

## 2014-02-23 DIAGNOSIS — J690 Pneumonitis due to inhalation of food and vomit: Secondary | ICD-10-CM | POA: Diagnosis not present

## 2014-02-23 DIAGNOSIS — D72829 Elevated white blood cell count, unspecified: Secondary | ICD-10-CM | POA: Diagnosis not present

## 2014-02-23 DIAGNOSIS — F0390 Unspecified dementia without behavioral disturbance: Secondary | ICD-10-CM | POA: Diagnosis not present

## 2014-02-23 DIAGNOSIS — M6281 Muscle weakness (generalized): Secondary | ICD-10-CM | POA: Diagnosis not present

## 2014-02-23 DIAGNOSIS — R627 Adult failure to thrive: Secondary | ICD-10-CM | POA: Diagnosis not present

## 2014-02-24 DIAGNOSIS — M6281 Muscle weakness (generalized): Secondary | ICD-10-CM | POA: Diagnosis not present

## 2014-02-24 DIAGNOSIS — R627 Adult failure to thrive: Secondary | ICD-10-CM | POA: Diagnosis not present

## 2014-02-24 DIAGNOSIS — D72829 Elevated white blood cell count, unspecified: Secondary | ICD-10-CM | POA: Diagnosis not present

## 2014-02-24 DIAGNOSIS — F0390 Unspecified dementia without behavioral disturbance: Secondary | ICD-10-CM | POA: Diagnosis not present

## 2014-02-24 DIAGNOSIS — J690 Pneumonitis due to inhalation of food and vomit: Secondary | ICD-10-CM | POA: Diagnosis not present

## 2014-02-24 DIAGNOSIS — E079 Disorder of thyroid, unspecified: Secondary | ICD-10-CM | POA: Diagnosis not present

## 2014-02-25 DIAGNOSIS — E079 Disorder of thyroid, unspecified: Secondary | ICD-10-CM | POA: Diagnosis not present

## 2014-02-25 DIAGNOSIS — J690 Pneumonitis due to inhalation of food and vomit: Secondary | ICD-10-CM | POA: Diagnosis not present

## 2014-02-25 DIAGNOSIS — F0390 Unspecified dementia without behavioral disturbance: Secondary | ICD-10-CM | POA: Diagnosis not present

## 2014-02-25 DIAGNOSIS — R627 Adult failure to thrive: Secondary | ICD-10-CM | POA: Diagnosis not present

## 2014-02-25 DIAGNOSIS — M6281 Muscle weakness (generalized): Secondary | ICD-10-CM | POA: Diagnosis not present

## 2014-02-25 DIAGNOSIS — D72829 Elevated white blood cell count, unspecified: Secondary | ICD-10-CM | POA: Diagnosis not present

## 2014-08-06 DIAGNOSIS — E039 Hypothyroidism, unspecified: Secondary | ICD-10-CM | POA: Diagnosis not present

## 2014-08-06 DIAGNOSIS — E876 Hypokalemia: Secondary | ICD-10-CM | POA: Diagnosis not present

## 2014-08-06 DIAGNOSIS — F0390 Unspecified dementia without behavioral disturbance: Secondary | ICD-10-CM | POA: Diagnosis not present

## 2014-08-06 DIAGNOSIS — Z23 Encounter for immunization: Secondary | ICD-10-CM | POA: Diagnosis not present

## 2014-11-10 ENCOUNTER — Emergency Department (HOSPITAL_COMMUNITY): Payer: Medicare Other

## 2014-11-10 ENCOUNTER — Emergency Department (HOSPITAL_COMMUNITY)
Admission: EM | Admit: 2014-11-10 | Discharge: 2014-11-10 | Disposition: A | Discharge status: Home / Self Care | Payer: Medicare Other | Attending: Emergency Medicine | Admitting: Emergency Medicine

## 2014-11-10 ENCOUNTER — Encounter (HOSPITAL_COMMUNITY): Payer: Self-pay

## 2014-11-10 DIAGNOSIS — Z862 Personal history of diseases of the blood and blood-forming organs and certain disorders involving the immune mechanism: Secondary | ICD-10-CM | POA: Diagnosis not present

## 2014-11-10 DIAGNOSIS — R404 Transient alteration of awareness: Secondary | ICD-10-CM | POA: Diagnosis not present

## 2014-11-10 DIAGNOSIS — R531 Weakness: Secondary | ICD-10-CM | POA: Diagnosis not present

## 2014-11-10 DIAGNOSIS — Z79899 Other long term (current) drug therapy: Secondary | ICD-10-CM | POA: Diagnosis not present

## 2014-11-10 DIAGNOSIS — R4182 Altered mental status, unspecified: Secondary | ICD-10-CM | POA: Diagnosis not present

## 2014-11-10 DIAGNOSIS — E079 Disorder of thyroid, unspecified: Secondary | ICD-10-CM | POA: Diagnosis not present

## 2014-11-10 DIAGNOSIS — N3090 Cystitis, unspecified without hematuria: Secondary | ICD-10-CM

## 2014-11-10 DIAGNOSIS — R569 Unspecified convulsions: Secondary | ICD-10-CM | POA: Diagnosis not present

## 2014-11-10 DIAGNOSIS — Z8619 Personal history of other infectious and parasitic diseases: Secondary | ICD-10-CM | POA: Diagnosis not present

## 2014-11-10 DIAGNOSIS — N390 Urinary tract infection, site not specified: Secondary | ICD-10-CM | POA: Diagnosis not present

## 2014-11-10 DIAGNOSIS — R9431 Abnormal electrocardiogram [ECG] [EKG]: Secondary | ICD-10-CM | POA: Diagnosis not present

## 2014-11-10 DIAGNOSIS — J9811 Atelectasis: Secondary | ICD-10-CM | POA: Diagnosis not present

## 2014-11-10 DIAGNOSIS — Z7982 Long term (current) use of aspirin: Secondary | ICD-10-CM | POA: Diagnosis not present

## 2014-11-10 DIAGNOSIS — Z87891 Personal history of nicotine dependence: Secondary | ICD-10-CM | POA: Insufficient documentation

## 2014-11-10 DIAGNOSIS — F99 Mental disorder, not otherwise specified: Secondary | ICD-10-CM | POA: Diagnosis not present

## 2014-11-10 DIAGNOSIS — Z8679 Personal history of other diseases of the circulatory system: Secondary | ICD-10-CM | POA: Insufficient documentation

## 2014-11-10 DIAGNOSIS — Z792 Long term (current) use of antibiotics: Secondary | ICD-10-CM | POA: Diagnosis not present

## 2014-11-10 LAB — COMPREHENSIVE METABOLIC PANEL
ALT: 14 U/L (ref 0–35)
ANION GAP: 5 (ref 5–15)
AST: 18 U/L (ref 0–37)
Albumin: 3.7 g/dL (ref 3.5–5.2)
Alkaline Phosphatase: 96 U/L (ref 39–117)
BUN: 14 mg/dL (ref 6–23)
CO2: 28 mmol/L (ref 19–32)
Calcium: 9.5 mg/dL (ref 8.4–10.5)
Chloride: 106 mmol/L (ref 96–112)
Creatinine, Ser: 0.76 mg/dL (ref 0.50–1.10)
GFR calc non Af Amer: 77 mL/min — ABNORMAL LOW (ref >90)
GFR, EST AFRICAN AMERICAN: 89 mL/min — AB (ref >90)
Glucose, Bld: 124 mg/dL — ABNORMAL HIGH (ref 70–99)
Potassium: 3.8 mmol/L (ref 3.5–5.1)
SODIUM: 139 mmol/L (ref 135–145)
Total Bilirubin: 0.6 mg/dL (ref 0.3–1.2)
Total Protein: 7.7 g/dL (ref 6.0–8.3)

## 2014-11-10 LAB — CBC
HEMATOCRIT: 47.7 % — AB (ref 36.0–46.0)
HEMOGLOBIN: 15.3 g/dL — AB (ref 12.0–15.0)
MCH: 28.1 pg (ref 26.0–34.0)
MCHC: 32.1 g/dL (ref 30.0–36.0)
MCV: 87.5 fL (ref 78.0–100.0)
Platelets: 177 10*3/uL (ref 150–400)
RBC: 5.45 MIL/uL — AB (ref 3.87–5.11)
RDW: 15.1 % (ref 11.5–15.5)
WBC: 5.6 10*3/uL (ref 4.0–10.5)

## 2014-11-10 LAB — TROPONIN I

## 2014-11-10 LAB — PROTIME-INR
INR: 1.21 (ref 0.00–1.49)
Prothrombin Time: 15.4 seconds — ABNORMAL HIGH (ref 11.6–15.2)

## 2014-11-10 LAB — URINALYSIS, ROUTINE W REFLEX MICROSCOPIC
Bilirubin Urine: NEGATIVE
GLUCOSE, UA: NEGATIVE mg/dL
Hgb urine dipstick: NEGATIVE
Ketones, ur: NEGATIVE mg/dL
Leukocytes, UA: NEGATIVE
Nitrite: POSITIVE — AB
PH: 6 (ref 5.0–8.0)
PROTEIN: NEGATIVE mg/dL
Specific Gravity, Urine: 1.007 (ref 1.005–1.030)
UROBILINOGEN UA: 0.2 mg/dL (ref 0.0–1.0)

## 2014-11-10 LAB — URINE MICROSCOPIC-ADD ON

## 2014-11-10 LAB — CBG MONITORING, ED: GLUCOSE-CAPILLARY: 154 mg/dL — AB (ref 70–99)

## 2014-11-10 MED ORDER — CEFPODOXIME PROXETIL 200 MG PO TABS
100.0000 mg | ORAL_TABLET | Freq: Once | ORAL | Status: AC
  Administered 2014-11-10: 100 mg via ORAL
  Filled 2014-11-10: qty 1

## 2014-11-10 MED ORDER — CEFPODOXIME PROXETIL 100 MG PO TABS: 100.0000 mg | ORAL_TABLET | Freq: Two times a day (BID) | ORAL | Status: DC

## 2014-11-10 NOTE — ED Provider Notes (Signed)
CSN: [id number]     Arrival date & time 11/10/14  1409 History   First MD Initiated Contact with Patient 11/10/14 1507     Chief Complaint  Patient presents with  . Altered Mental Status     (Consider location/radiation/quality/duration/timing/severity/associated sxs/prior Treatment) Patient is a 81 y.o. female presenting with syncope.  Loss of Consciousness Episode history:  Single Most recent episode:  Today Duration:  1 minute Timing:  Constant Progression:  Resolved Chronicity:  Recurrent Context: normal activity (drinking coffee)   Context: not inactivity   Witnessed: no   Relieved by:  Nothing Worsened by:  Nothing tried Ineffective treatments:  None tried Associated symptoms: no vomiting     Past Medical History  Diagnosis Date  . Dementia     worse since 2012  . Thrush   . Thyroid disease   . FTT (failure to thrive) in adult   . 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 SDH (subdural hematoma)     b/l 11/2011 at [facility] placed on keppra 250 mg bid    Past Surgical History  Procedure Laterality Date  . Other surgical history  04/21/2011    left hip internal fixation and reduction intertrochanteric and femoral neck  . Other surgical history  ~2 yrs ago    brain surgery   Family History  Problem Relation Age of Onset  . Hypertension      daughter 1(living)  . Suicidality      daughter 2   History  Substance Use Topics  . Smoking status: Former Smoker -- 1.00 packs/day for 40 years    Types: Cigarettes  . Smokeless tobacco: Never Used  . Alcohol Use: No   OB History    No data available     Review of Systems  Cardiovascular: Positive for syncope.  Gastrointestinal: Negative for vomiting.  All other systems reviewed and are negative.     Allergies  Review of patient's allergies indicates no known allergies.  Home Medications   Prior to Admission medications   Medication Sig Start Date End Date Taking? Authorizing Provider  aspirin EC 81 MG tablet  Take 81 mg by mouth daily.   Yes Historical Provider, MD  CALCIUM-MAGNESIUM-ZINC PO Take 1 tablet by mouth daily. In the evening   Yes Historical Provider, MD  levothyroxine (SYNTHROID, LEVOTHROID) 25 MCG tablet Take 25 mcg by mouth every morning.    Yes Historical Provider, MD  Memantine HCl-Donepezil HCl 28-10 MG CP24 Take 1 tablet by mouth daily. In the evening   Yes Historical Provider, MD  Multiple Vitamin (MULTIVITAMIN WITH MINERALS) TABS Take 1 tablet by mouth daily.    Yes Historical Provider, MD  potassium chloride (K-DUR,KLOR-CON) 10 MEQ tablet Take 10 mEq by mouth 2 (two) times daily.   Yes Historical Provider, MD  vitamin C (ASCORBIC ACID) 500 MG tablet Take 500 mg by mouth every evening.   Yes Historical Provider, MD  amoxicillin-clavulanate (AUGMENTIN) 875-125 MG per tablet Take 1 tablet by mouth 2 (two) times daily. Patient not taking: Reported on 11/10/2014 12/26/13   Maryruth Bun Rama, MD  bisacodyl (DULCOLAX) 5 MG EC tablet Take 1 tablet (5 mg total) by mouth daily as needed for moderate constipation. Patient not taking: Reported on 11/10/2014 12/26/13   Maryruth Bun Rama, MD  cefpodoxime (VANTIN) 100 MG tablet Take 1 tablet (100 mg total) by mouth 2 (two) times daily. 11/10/14   Mirian Mo, MD  nystatin (MYCOSTATIN) 100000 UNIT/ML suspension Take 5 mLs (500,000  Units total) by mouth 4 (four) times daily. Patient not taking: Reported on 11/10/2014 12/26/13   Maryruth Bun Rama, MD   BP 116/63 mmHg  Pulse 71  Temp(Src) 99.5 F (37.5 C) (Oral)  Resp 19  SpO2 96% Physical Exam  Constitutional: She appears well-developed and well-nourished.  HENT:  Head: Normocephalic and atraumatic.  Right Ear: External ear normal.  Left Ear: External ear normal.  Eyes: Conjunctivae and EOM are normal. Pupils are equal, round, and reactive to light.  Neck: Normal range of motion. Neck supple.  Cardiovascular: Normal rate, regular rhythm, normal heart sounds and intact distal pulses.    Pulmonary/Chest: Effort normal and breath sounds normal.  Abdominal: Soft. Bowel sounds are normal. There is no tenderness.  Musculoskeletal: Normal range of motion.  Neurological: She is alert. She has normal strength and normal reflexes. No cranial nerve deficit or sensory deficit.  No focal neuro deficits in context of limited exam  Skin: Skin is warm and dry.  Vitals reviewed.   ED Course  Procedures (including critical care time) Labs Review Labs Reviewed  CBC - Abnormal; Notable for the following:    RBC 5.45 (*)    Hemoglobin 15.3 (*)    HCT 47.7 (*)    All other components within normal limits  COMPREHENSIVE METABOLIC PANEL - Abnormal; Notable for the following:    Glucose, Bld 124 (*)    GFR calc non Af Amer 77 (*)    GFR calc Af Amer 89 (*)    All other components within normal limits  PROTIME-INR - Abnormal; Notable for the following:    Prothrombin Time 15.4 (*)    All other components within normal limits  URINALYSIS, ROUTINE W REFLEX MICROSCOPIC - Abnormal; Notable for the following:    Nitrite POSITIVE (*)    All other components within normal limits  URINE MICROSCOPIC-ADD ON - Abnormal; Notable for the following:    Bacteria, UA MANY (*)    All other components within normal limits  CBG MONITORING, ED - Abnormal; Notable for the following:    Glucose-Capillary 154 (*)    All other components within normal limits  URINE CULTURE  TROPONIN I  CBG MONITORING, ED    Imaging Review Dg Chest 2 View  11/10/2014   CLINICAL DATA:  Altered mental status.  Initial encounter.  EXAM: CHEST  2 VIEW  COMPARISON:  12/25/2013.  FINDINGS: 1632 hr. Lower lung volumes with resulting mild bibasilar atelectasis. There is underlying chronic central airway thickening, but no confluent airspace opacity, edema or significant pleural effusion. The heart size and mediastinal contours are stable with aortic atherosclerosis. No acute osseous findings evident. Telemetry leads overlie the  chest.  IMPRESSION: Lower lung volumes with resulting bibasilar atelectasis. No other significant findings.   Electronically Signed   By: Carey Bullocks M.D.   On: 11/10/2014 16:42   Ct Head Wo Contrast  11/10/2014   CLINICAL DATA:  Altered mental status, syncopal episode earlier today, history of chronic subdural hematomas. Dementia.  EXAM: CT HEAD WITHOUT CONTRAST  TECHNIQUE: Contiguous axial images were obtained from the base of the skull through the vertex without contrast.  COMPARISON:  07/06/2012  FINDINGS: Similar pattern of diffuse brain atrophy and chronic white matter microvascular ischemic changes, most pronounced in the frontal lobes. Stable ventricular enlargement with prominent temporal horns as before. Chronic appearing heterogeneous small bifrontal subdural collections remain evident. No new acute intracranial hemorrhage, midline shift, or herniation. Postop changes from bilateral craniotomies. Cisterns are patent.  Cerebellar atrophy as well. Postoperative dural thickening on the right. The mastoids and sinuses remain clear.  IMPRESSION: Remote bilateral craniotomies.  Residual chronic bifrontal subdural collections.  No acute intracranial hemorrhage  Stable brain atrophy and chronic white matter microvascular change.  Stable ventricular enlargement, normal pressure hydrocephalus is not excluded.   Electronically Signed   By: Judie PetitM.  Shick M.D.   On: 11/10/2014 16:40     EKG Interpretation   Date/Time:  Wednesday November 10 2014 14:20:26 EST Ventricular Rate:  70 PR Interval:  146 QRS Duration: 100 QT Interval:  406 QTC Calculation: 438 R Axis:   -28 Text Interpretation:  Sinus rhythm Borderline left axis deviation Abnormal  R-wave progression, early transition Borderline T abnormalities, diffuse  leads Baseline wander in lead(s) V1 No significant change since last  tracing Confirmed by Mirian MoGentry, Matthew [phone] on 11/10/2014 3:45:21 PM      MDM   Final diagnoses:  Cystitis   Transient alteration of awareness    81 y.o. female with pertinent PMH of dementia, polyclonal gammopathy presents with presyncopal episode while drinking coffee this am. Patient had no seizure activity, no postictal symptoms given limited exam and history due to dementia.  Pt did not totally lose postural tone, and remained seated in her chair throughout the episode.  Physical exam on arrival as above. Family state patient is at baseline.  She has apparently done this in the past with prior UTI.  WU as above with UTI.  Vantin given with prescription for same.  This is likely etiology, given that she did not completely pass out and has an unremarkable wu otherwise here.  DC home with strict return precautions.  I have reviewed all laboratory and imaging studies if ordered as above  1. Cystitis   2. Transient alteration of awareness         Mirian MoMatthew Gentry, MD 11/10/14 [phone]

## 2014-11-10 NOTE — Discharge Instructions (Signed)
Altered Mental Status °Altered mental status most often refers to an abnormal change in your responsiveness and awareness. It can affect your speech, thought, mobility, memory, attention span, or alertness. It can range from slight confusion to complete unresponsiveness (coma). Altered mental status can be a sign of a serious underlying medical condition. Rapid evaluation and medical treatment is necessary for patients having an altered mental status. °CAUSES  °· Low blood sugar (hypoglycemia) or diabetes. °· Severe loss of body fluids (dehydration) or a body salt (electrolyte) imbalance. °· A stroke or other neurologic problem, such as dementia or delirium. °· A head injury or tumor. °· A drug or alcohol overdose. °· Exposure to toxins or poisons. °· Depression, anxiety, and stress. °· A low oxygen level (hypoxia). °· An infection. °· Blood loss. °· Twitching or shaking (seizure). °· Heart problems, such as heart attack or heart rhythm problems (arrhythmias). °· A body temperature that is too low or too high (hypothermia or hyperthermia). °DIAGNOSIS  °A diagnosis is based on your history, symptoms, physical and neurologic examinations, and diagnostic tests. Diagnostic tests may include: °· Measurement of your blood pressure, pulse, breathing, and oxygen levels (vital signs). °· Blood tests. °· Urine tests. °· X-ray exams. °· A computerized magnetic scan (magnetic resonance imaging, MRI). °· A computerized X-ray scan (computed tomography, CT scan). °TREATMENT  °Treatment will depend on the cause. Treatment may include: °· Management of an underlying medical or mental health condition. °· Critical care or support in the hospital. °HOME CARE INSTRUCTIONS  °· Only take over-the-counter or prescription medicines for pain, discomfort, or fever as directed by your caregiver. °· Manage underlying conditions as directed by your caregiver. °· Eat a healthy, well-balanced diet to maintain strength. °· Join a support group or  prevention program to cope with the condition or trauma that caused the altered mental status. Ask your caregiver to help choose a program that works for you. °· Follow up with your caregiver for further examination, therapy, or testing as directed. °SEEK MEDICAL CARE IF:  °· You feel unwell or have chills. °· You or your family notice a change in your behavior or your alertness. °· You have trouble following your caregiver's treatment plan. °· You have questions or concerns. °SEEK IMMEDIATE MEDICAL CARE IF:  °· You have a rapid heartbeat or have chest pain. °· You have difficulty breathing. °· You have a fever. °· You have a headache with a stiff neck. °· You cough up blood. °· You have blood in your urine or stool. °· You have severe agitation or confusion. °MAKE SURE YOU:  °· Understand these instructions. °· Will watch your condition. °· Will get help right away if you are not doing well or get worse. °Document Released: 03/07/2010 Document Revised: 12/10/2011 Document Reviewed: 03/07/2010 °ExitCare® Patient Information ©2015 [employer]. This information is not intended to replace advice given to you by your health care provider. Make sure you discuss any questions you have with your health care provider. °Urinary Tract Infection °Urinary tract infections (UTIs) can develop anywhere along your urinary tract. Your urinary tract is your body's drainage system for removing wastes and extra water. Your urinary tract includes two kidneys, two ureters, a bladder, and a urethra. Your kidneys are a pair of bean-shaped organs. Each kidney is about the size of your fist. They are located below your ribs, one on each side of your spine. °CAUSES °Infections are caused by microbes, which are microscopic organisms, including fungi, viruses, and bacteria. These   organisms are so small that they can only be seen through a microscope. Bacteria are the microbes that most commonly cause UTIs. °SYMPTOMS  °Symptoms of UTIs may  vary by age and gender of the patient and by the location of the infection. Symptoms in young women typically include a frequent and intense urge to urinate and a painful, burning feeling in the bladder or urethra during urination. Older women and men are more likely to be tired, shaky, and weak and have muscle aches and abdominal pain. A fever may mean the infection is in your kidneys. Other symptoms of a kidney infection include pain in your back or sides below the ribs, nausea, and vomiting. °DIAGNOSIS °To diagnose a UTI, your caregiver will ask you about your symptoms. Your caregiver also will ask to provide a urine sample. The urine sample will be tested for bacteria and white blood cells. White blood cells are made by your body to help fight infection. °TREATMENT  °Typically, UTIs can be treated with medication. Because most UTIs are caused by a bacterial infection, they usually can be treated with the use of antibiotics. The choice of antibiotic and length of treatment depend on your symptoms and the type of bacteria causing your infection. °HOME CARE INSTRUCTIONS °· If you were prescribed antibiotics, take them exactly as your caregiver instructs you. Finish the medication even if you feel better after you have only taken some of the medication. °· Drink enough water and fluids to keep your urine clear or pale yellow. °· Avoid caffeine, tea, and carbonated beverages. They tend to irritate your bladder. °· Empty your bladder often. Avoid holding urine for long periods of time. °· Empty your bladder before and after sexual intercourse. °· After a bowel movement, women should cleanse from front to back. Use each tissue only once. °SEEK MEDICAL CARE IF:  °· You have back pain. °· You develop a fever. °· Your symptoms do not begin to resolve within 3 days. °SEEK IMMEDIATE MEDICAL CARE IF:  °· You have severe back pain or lower abdominal pain. °· You develop chills. °· You have nausea or vomiting. °· You have  continued burning or discomfort with urination. °MAKE SURE YOU:  °· Understand these instructions. °· Will watch your condition. °· Will get help right away if you are not doing well or get worse. °Document Released: 06/27/2005 Document Revised: 03/18/2012 Document Reviewed: 10/26/2011 °ExitCare® Patient Information ©2015 [employer]. This information is not intended to replace advice given to you by your health care provider. Make sure you discuss any questions you have with your health care provider. ° °

## 2014-11-10 NOTE — ED Notes (Signed)
AVS explained in detail, knows to take antibiotic until completed. No other questions/concerns.

## 2014-11-10 NOTE — ED Notes (Signed)
Bed: WA13 Expected date:  Expected time:  Means of arrival:  Comments: ems  

## 2014-11-10 NOTE — ED Notes (Signed)
Patient transported to CT 

## 2014-11-10 NOTE — ED Notes (Signed)
Per Dr. Littie DeedsGentry, patient to go back via [facility] due to advanced dementia and unable to ambulate  Will call [facility] when patient up for DC

## 2014-11-10 NOTE — ED Notes (Signed)
Per EMS, Pt, from home, c/o altered mental status x 30 minutes.  Caregiver reports Pt was sitting and drinking coffee, when she became unresponsive.  Sts Pt was "slumped over."  Caregiver sts Pt is returning to neuro baseline.  Hx of dementia and noncommunicative.  EMS reports Pt eyes are tracking and Pt will follow commands.

## 2014-11-10 NOTE — ED Notes (Signed)
Awaiting arrival of medication from main pharmacy

## 2014-11-12 LAB — URINE CULTURE

## 2014-11-13 ENCOUNTER — Telehealth (HOSPITAL_BASED_OUTPATIENT_CLINIC_OR_DEPARTMENT_OTHER): Payer: Self-pay | Admitting: Emergency Medicine

## 2014-11-15 ENCOUNTER — Telehealth (HOSPITAL_COMMUNITY): Payer: Self-pay

## 2014-11-16 ENCOUNTER — Telehealth (HOSPITAL_BASED_OUTPATIENT_CLINIC_OR_DEPARTMENT_OTHER): Payer: Self-pay | Admitting: Emergency Medicine

## 2014-11-16 NOTE — Telephone Encounter (Signed)
Post ED Visit - Positive Culture Follow-up: Successful Patient Follow-Up  Culture assessed and recommendations reviewed by: []  Wes Dulaney, Pharm.D., BCPS [x]  Celedonio MiyamotoJeremy Frens, Pharm.D., BCPS []  Georgina PillionElizabeth Martin, Pharm.D., BCPS []  FloydMinh Pham, [address] Rainbow BoulevardPharm.D., BCPS, AAHIVP []  Estella HuskMichelle Turner, Pharm.D., BCPS, AAHIVP []  Red ChristiansSamson Lee, Pharm.D. []  Tennis Mustassie Stewart, [place]arm[place]D.  Positive urine culture E. Coli  []  Patient discharged without antimicrobial prescription and treatment is now indicated [x]  Organism is resistant to prescribed ED discharge antimicrobial []  Patient with positive blood cultures  Changes discussed with ED provider:[place] New antibiotic prescription Cipro 250mg  po bid x 7 days [facility] [address]  Contacted daughter Lessie DingsVickye 11/16/14 1442   Berle MullMiller, Ashly Goethe 11/16/2014, 2:41 PM

## 2014-12-23 ENCOUNTER — Emergency Department (HOSPITAL_COMMUNITY): Payer: Medicare Other

## 2014-12-23 ENCOUNTER — Emergency Department (HOSPITAL_COMMUNITY)
Admission: EM | Admit: 2014-12-23 | Discharge: 2014-12-23 | Disposition: A | Discharge status: Home / Self Care | Payer: Medicare Other | Attending: Emergency Medicine | Admitting: Emergency Medicine

## 2014-12-23 ENCOUNTER — Encounter (HOSPITAL_COMMUNITY): Payer: Self-pay | Admitting: *Deleted

## 2014-12-23 DIAGNOSIS — J159 Unspecified bacterial pneumonia: Secondary | ICD-10-CM | POA: Diagnosis not present

## 2014-12-23 DIAGNOSIS — Z8679 Personal history of other diseases of the circulatory system: Secondary | ICD-10-CM | POA: Diagnosis not present

## 2014-12-23 DIAGNOSIS — J188 Other pneumonia, unspecified organism: Secondary | ICD-10-CM | POA: Diagnosis not present

## 2014-12-23 DIAGNOSIS — Z87891 Personal history of nicotine dependence: Secondary | ICD-10-CM | POA: Insufficient documentation

## 2014-12-23 DIAGNOSIS — Z792 Long term (current) use of antibiotics: Secondary | ICD-10-CM | POA: Insufficient documentation

## 2014-12-23 DIAGNOSIS — R269 Unspecified abnormalities of gait and mobility: Secondary | ICD-10-CM | POA: Diagnosis not present

## 2014-12-23 DIAGNOSIS — E079 Disorder of thyroid, unspecified: Secondary | ICD-10-CM | POA: Diagnosis not present

## 2014-12-23 DIAGNOSIS — J189 Pneumonia, unspecified organism: Secondary | ICD-10-CM | POA: Diagnosis not present

## 2014-12-23 DIAGNOSIS — Z862 Personal history of diseases of the blood and blood-forming organs and certain disorders involving the immune mechanism: Secondary | ICD-10-CM | POA: Diagnosis not present

## 2014-12-23 DIAGNOSIS — Z79899 Other long term (current) drug therapy: Secondary | ICD-10-CM | POA: Diagnosis not present

## 2014-12-23 DIAGNOSIS — Z7982 Long term (current) use of aspirin: Secondary | ICD-10-CM | POA: Insufficient documentation

## 2014-12-23 DIAGNOSIS — Z8619 Personal history of other infectious and parasitic diseases: Secondary | ICD-10-CM | POA: Insufficient documentation

## 2014-12-23 DIAGNOSIS — F0390 Unspecified dementia without behavioral disturbance: Secondary | ICD-10-CM | POA: Insufficient documentation

## 2014-12-23 DIAGNOSIS — R05 Cough: Secondary | ICD-10-CM | POA: Diagnosis not present

## 2014-12-23 DIAGNOSIS — I6789 Other cerebrovascular disease: Secondary | ICD-10-CM | POA: Diagnosis not present

## 2014-12-23 MED ORDER — DOXYCYCLINE HYCLATE 100 MG PO TABS: 100.0000 mg | ORAL_TABLET | Freq: Two times a day (BID) | ORAL | Status: DC

## 2014-12-23 MED ORDER — AZITHROMYCIN 200 MG/5ML PO SUSR: 500.0000 mg | Freq: Every day | ORAL | Status: AC

## 2014-12-23 MED ORDER — AZITHROMYCIN 200 MG/5ML PO SUSR
500.0000 mg | Freq: Once | ORAL | Status: AC
  Administered 2014-12-23: 500 mg via ORAL
  Filled 2014-12-23: qty 12.5

## 2014-12-23 MED ORDER — DOXYCYCLINE HYCLATE 100 MG PO TABS
100.0000 mg | ORAL_TABLET | Freq: Once | ORAL | Status: DC
  Filled 2014-12-23: qty 1

## 2014-12-23 NOTE — ED Notes (Signed)
Family states that patient has a history of aspiration and that she pulls at her ears constantly. Her Daughter believes she has a sinus infection.

## 2014-12-23 NOTE — ED Notes (Signed)
[facility] called for transport, granddaughter will ride

## 2014-12-23 NOTE — Discharge Instructions (Signed)
As discussed, today's x-ray demonstrates pneumonia.  It is important to call your mother's primary care physician tomorrow to arrange appropriate outpatient follow-up.  Return here for concerning changes.

## 2014-12-23 NOTE — ED Notes (Signed)
Patient resting quietly and in no acute distress at this time. Daughter and caregiver at the bedside.

## 2014-12-23 NOTE — ED Notes (Signed)
Bed: ZO10WA05 Expected date:  Expected time:  Means of arrival:  Comments: Ems, cough

## 2014-12-23 NOTE — ED Notes (Signed)
According to EMS family called for productive cough times 2 weeks. Patient was conscious and alert on scene. Saturations within normal limits per EMS. According to family PCP will only give tylenol and mucinex.

## 2014-12-23 NOTE — Progress Notes (Signed)
  CARE MANAGEMENT ED NOTE 12/23/2014  Patient:  Kristi Webb,Kristi Webb   Account Number:  [id number]  Date Initiated:  12/23/2014  Documentation initiated by:  Radford PaxFERRERO,Styles Fambro  Subjective/Objective Assessment:   Patient presents to Ed with cough for 2-3 weeks     Subjective/Objective Assessment Detail:   Patient afebrile.  Patient is bed bound and wheelchair bound at home.     Action/Plan:   Action/Plan Detail:   Anticipated DC Date:  12/23/2014     Status Recommendation to Physician:   Result of Recommendation:    Other ED Services  Consult Working Plan    DC Planning Services  Other  Outpatient Services - Pt will follow up   [facility]  [facility]   Choice offered to / List presented to:  C-4 Adult Children     HH arranged  HH-1 RN  HH-3 OT  HH-2 PT  HH-4 NURSE'S AIDE  HH-6 SOCIAL WORKER      HH agency  [facility]    Status of service:  Completed, signed off  ED Comments:   ED Comments Detail:  [facility] by [facility] to see patient regarding having a doctor visit the patient at home.  EDCM spoke to  patient and her caregiver at bedside.  Caregiver answered all of EDCM questions.  Patient loives at home with her daughter Kristi Webb. Per caregiver, patient's daughter would like to have a dctor visit the patient at home and would also like to start home health services with [facility] again. [facility] provided [facility]'s caregiver with contact information for [facility] and [facility] for a visiting doctor.  Caregiver gave [facility] for patient's daughter Kristi Webb [phone].  EDCM called patient's daughter and spoke to [place].  Vicky reports patient's caregiver at bedside name is Wilhemina CashKaesha.  Vicky reports patient has 24 hour care.  Vicky stays with the patient at night.  EDCM informed Vicky that information for visiting doctors given to [facility].  EDCM also discussed with EDP who placed home health orders for RN, PT, OT, aide and Child psychotherapistsocial worker.  Vicky  [facility] as [facility] was very pleased with them.  EDCM assessed for dme needs.  Vicky reports she believes patient needs a new wheelchair however, she has gotten this wheelchair through the [employer] within the last five years.  EDCM informed patient's daughter that Medicare would not cover cost of wheelchair.  EDCM suggested calling [employer], churches, Therap[facility], [facility], [facility] for [facility].  Patient's daughter also reports having difficulty transporting patient.  EDCM provided Vicky with phone number to [facility] medical transport.  Patient's duaghter very thankful for assistance.  No further EDCM needs at this time.  [facility] with confirmation of receipt at 2020pm.

## 2014-12-23 NOTE — ED Provider Notes (Signed)
CSN: [id number]     Arrival date & time 12/23/14  1344 History   First MD Initiated Contact with Patient 12/23/14 1459     Chief Complaint  Patient presents with  . Cough    HPI  Level V caveat - dementia, non-verbal  Patient presents with her daughter/caregiver who provides the history of present illness. She states over the past 2 or 3 weeks the patient has had persistent cough, worse at night. No report of fever, no vomiting. Minimal relief with Tylenol and Mucinex. No particular change today, but family is concerned of the persistency of symptoms. The patient herself appears calm, in no distress, but is nonverbal.  Past Medical History  Diagnosis Date  . Dementia     worse since 2012  . Thrush   . Thyroid disease   . FTT (failure to thrive) in adult   . 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 SDH (subdural hematoma)     [facility] placed on keppra 250 mg bid    Past Surgical History  Procedure Laterality Date  . Other surgical history  04/21/2011    left hip internal fixation and reduction intertrochanteric and femoral neck  . Other surgical history  ~2 yrs ago    brain surgery   Family History  Problem Relation Age of Onset  . Hypertension      daughter 1(living)  . Suicidality      daughter 2   History  Substance Use Topics  . Smoking status: Former Smoker -- 1.00 packs/day for 40 years    Types: Cigarettes  . Smokeless tobacco: Never Used  . Alcohol Use: No   OB History    No data available     Review of Systems  Unable to perform ROS: Dementia      Allergies  Review of patient's allergies indicates no known allergies.  Home Medications   Prior to Admission medications   Medication Sig Start Date End Date Taking? Authorizing Provider  acetaminophen (TYLENOL) 500 MG tablet Take 500-1,000 mg by mouth every 6 (six) hours as needed for mild pain, moderate pain or fever.   Yes Historical Provider, MD  aspirin EC 81 MG tablet Take 81 mg by mouth  daily.   Yes Historical Provider, MD  CALCIUM-MAGNESIUM-ZINC PO Take 1 tablet by mouth daily. In the evening   Yes Historical Provider, MD  donepezil (ARICEPT) 10 MG tablet Take 10 mg by mouth at bedtime. 11/30/14 11/30/15 Yes Historical Provider, MD  GuaiFENesin (MUCINEX PO) Take 2 tablets by mouth daily as needed (mucus and cough).   Yes Historical Provider, MD  levothyroxine (SYNTHROID, LEVOTHROID) 25 MCG tablet Take 25 mcg by mouth every morning.    Yes Historical Provider, MD  memantine (NAMENDA XR) 28 MG CP24 24 hr capsule Take 1 tablet by mouth daily. 11/30/14  Yes Historical Provider, MD  Multiple Vitamin (MULTIVITAMIN WITH MINERALS) TABS Take 1 tablet by mouth daily.    Yes Historical Provider, MD  potassium chloride (K-DUR,KLOR-CON) 10 MEQ tablet Take 10 mEq by mouth 2 (two) times daily.   Yes Historical Provider, MD  vitamin C (ASCORBIC ACID) 500 MG tablet Take 500 mg by mouth every evening.   Yes Historical Provider, MD  amoxicillin-clavulanate (AUGMENTIN) 875-125 MG per tablet Take 1 tablet by mouth 2 (two) times daily. Patient not taking: Reported on 11/10/2014 12/26/13   Maryruth Bunhristina P Rama, MD  bisacodyl (DULCOLAX) 5 MG EC tablet Take 1 tablet (5 mg total)  by mouth daily as needed for moderate constipation. Patient not taking: Reported on 11/10/2014 12/26/13   Maryruth Bun Rama, MD  cefpodoxime (VANTIN) 100 MG tablet Take 1 tablet (100 mg total) by mouth 2 (two) times daily. 11/10/14   Mirian Mo, MD  nystatin (MYCOSTATIN) 100000 UNIT/ML suspension Take 5 mLs (500,000 Units total) by mouth 4 (four) times daily. Patient not taking: Reported on 11/10/2014 12/26/13   Maryruth Bun Rama, MD   BP 101/59 mmHg  Pulse 63  Temp(Src) 98.8 F (37.1 C) (Oral)  Resp 18  SpO2 97% Physical Exam  Constitutional: She has a sickly appearance. No distress.  HENT:  Head: Normocephalic and atraumatic.  Eyes: Conjunctivae and EOM are normal.  Cardiovascular: Normal rate and regular rhythm.   Pulmonary/Chest:  Effort normal and breath sounds normal. No stridor. No respiratory distress.  Coarse expiratory sounds bilaterally  Abdominal: She exhibits no distension.  Musculoskeletal: She exhibits no edema.  Neurological: She displays no tremor. No cranial nerve deficit.  No facial asymmetry, patient is nonverbal patient moves all extremities minimally, spontaneously, does not participate in neurologic exam  Skin: Skin is warm and dry.  Psychiatric: Cognition and memory are impaired.  Nursing note and vitals reviewed.   ED Course  Procedures (including critical care time) I reviewed the results (including imaging as performed), agree with the interpretation  On repeat exam the patient appears in similar condition.  We reviewed all findings.  Patient will receive initial dose of antibiotic for a new pneumonia. She remains in no distress, with no hypoxia, evidence for eminent decompensation. Family and I had a lengthy conversation about the patient's chronic medical issues, the need for close follow-up. MDM  This elderly female, bedbound, wheelchair-bound, with dementia now presents with ongoing cough. Here the patient is awake, but noninteractive. Patient is afebrile, not hypoxic, tachycardic, tachypneic. Patient has evidence for pneumonia on x-ray. She was started on antibiotics, will follow up with primary care.    Gerhard Munch, MD 12/23/14 [phone]

## 2014-12-27 DIAGNOSIS — R0682 Tachypnea, not elsewhere classified: Secondary | ICD-10-CM | POA: Diagnosis not present

## 2014-12-27 DIAGNOSIS — J189 Pneumonia, unspecified organism: Secondary | ICD-10-CM | POA: Diagnosis not present

## 2014-12-27 DIAGNOSIS — R627 Adult failure to thrive: Secondary | ICD-10-CM | POA: Diagnosis not present

## 2014-12-27 DIAGNOSIS — F0390 Unspecified dementia without behavioral disturbance: Secondary | ICD-10-CM | POA: Diagnosis not present

## 2014-12-27 DIAGNOSIS — I69321 Dysphasia following cerebral infarction: Secondary | ICD-10-CM | POA: Diagnosis not present

## 2014-12-27 DIAGNOSIS — R000 Tachycardia, unspecified: Secondary | ICD-10-CM | POA: Diagnosis not present

## 2014-12-27 DIAGNOSIS — Z7401 Bed confinement status: Secondary | ICD-10-CM | POA: Diagnosis not present

## 2014-12-29 DIAGNOSIS — J189 Pneumonia, unspecified organism: Secondary | ICD-10-CM | POA: Diagnosis not present

## 2014-12-30 DIAGNOSIS — R0682 Tachypnea, not elsewhere classified: Secondary | ICD-10-CM | POA: Diagnosis not present

## 2014-12-30 DIAGNOSIS — R627 Adult failure to thrive: Secondary | ICD-10-CM | POA: Diagnosis not present

## 2014-12-30 DIAGNOSIS — R000 Tachycardia, unspecified: Secondary | ICD-10-CM | POA: Diagnosis not present

## 2014-12-30 DIAGNOSIS — I69321 Dysphasia following cerebral infarction: Secondary | ICD-10-CM | POA: Diagnosis not present

## 2014-12-30 DIAGNOSIS — J189 Pneumonia, unspecified organism: Secondary | ICD-10-CM | POA: Diagnosis not present

## 2014-12-30 DIAGNOSIS — F0390 Unspecified dementia without behavioral disturbance: Secondary | ICD-10-CM | POA: Diagnosis not present

## 2015-01-03 DIAGNOSIS — R000 Tachycardia, unspecified: Secondary | ICD-10-CM | POA: Diagnosis not present

## 2015-01-03 DIAGNOSIS — R627 Adult failure to thrive: Secondary | ICD-10-CM | POA: Diagnosis not present

## 2015-01-03 DIAGNOSIS — I69321 Dysphasia following cerebral infarction: Secondary | ICD-10-CM | POA: Diagnosis not present

## 2015-01-03 DIAGNOSIS — R0682 Tachypnea, not elsewhere classified: Secondary | ICD-10-CM | POA: Diagnosis not present

## 2015-01-03 DIAGNOSIS — J189 Pneumonia, unspecified organism: Secondary | ICD-10-CM | POA: Diagnosis not present

## 2015-01-03 DIAGNOSIS — F0390 Unspecified dementia without behavioral disturbance: Secondary | ICD-10-CM | POA: Diagnosis not present

## 2015-01-04 DIAGNOSIS — R0682 Tachypnea, not elsewhere classified: Secondary | ICD-10-CM | POA: Diagnosis not present

## 2015-01-04 DIAGNOSIS — I69321 Dysphasia following cerebral infarction: Secondary | ICD-10-CM | POA: Diagnosis not present

## 2015-01-04 DIAGNOSIS — F0390 Unspecified dementia without behavioral disturbance: Secondary | ICD-10-CM | POA: Diagnosis not present

## 2015-01-04 DIAGNOSIS — J189 Pneumonia, unspecified organism: Secondary | ICD-10-CM | POA: Diagnosis not present

## 2015-01-04 DIAGNOSIS — R000 Tachycardia, unspecified: Secondary | ICD-10-CM | POA: Diagnosis not present

## 2015-01-04 DIAGNOSIS — R627 Adult failure to thrive: Secondary | ICD-10-CM | POA: Diagnosis not present

## 2015-01-05 DIAGNOSIS — J189 Pneumonia, unspecified organism: Secondary | ICD-10-CM | POA: Diagnosis not present

## 2015-01-05 DIAGNOSIS — R0682 Tachypnea, not elsewhere classified: Secondary | ICD-10-CM | POA: Diagnosis not present

## 2015-01-05 DIAGNOSIS — R000 Tachycardia, unspecified: Secondary | ICD-10-CM | POA: Diagnosis not present

## 2015-01-05 DIAGNOSIS — R627 Adult failure to thrive: Secondary | ICD-10-CM | POA: Diagnosis not present

## 2015-01-05 DIAGNOSIS — F0390 Unspecified dementia without behavioral disturbance: Secondary | ICD-10-CM | POA: Diagnosis not present

## 2015-01-05 DIAGNOSIS — I69321 Dysphasia following cerebral infarction: Secondary | ICD-10-CM | POA: Diagnosis not present

## 2015-01-07 DIAGNOSIS — I69321 Dysphasia following cerebral infarction: Secondary | ICD-10-CM | POA: Diagnosis not present

## 2015-01-07 DIAGNOSIS — R000 Tachycardia, unspecified: Secondary | ICD-10-CM | POA: Diagnosis not present

## 2015-01-07 DIAGNOSIS — J189 Pneumonia, unspecified organism: Secondary | ICD-10-CM | POA: Diagnosis not present

## 2015-01-07 DIAGNOSIS — R0682 Tachypnea, not elsewhere classified: Secondary | ICD-10-CM | POA: Diagnosis not present

## 2015-01-07 DIAGNOSIS — R627 Adult failure to thrive: Secondary | ICD-10-CM | POA: Diagnosis not present

## 2015-01-07 DIAGNOSIS — F0390 Unspecified dementia without behavioral disturbance: Secondary | ICD-10-CM | POA: Diagnosis not present

## 2015-01-10 DIAGNOSIS — I69321 Dysphasia following cerebral infarction: Secondary | ICD-10-CM | POA: Diagnosis not present

## 2015-01-10 DIAGNOSIS — J189 Pneumonia, unspecified organism: Secondary | ICD-10-CM | POA: Diagnosis not present

## 2015-01-10 DIAGNOSIS — R000 Tachycardia, unspecified: Secondary | ICD-10-CM | POA: Diagnosis not present

## 2015-01-10 DIAGNOSIS — R627 Adult failure to thrive: Secondary | ICD-10-CM | POA: Diagnosis not present

## 2015-01-10 DIAGNOSIS — R0682 Tachypnea, not elsewhere classified: Secondary | ICD-10-CM | POA: Diagnosis not present

## 2015-01-10 DIAGNOSIS — F0390 Unspecified dementia without behavioral disturbance: Secondary | ICD-10-CM | POA: Diagnosis not present

## 2015-01-11 DIAGNOSIS — F0390 Unspecified dementia without behavioral disturbance: Secondary | ICD-10-CM | POA: Diagnosis not present

## 2015-01-11 DIAGNOSIS — J189 Pneumonia, unspecified organism: Secondary | ICD-10-CM | POA: Diagnosis not present

## 2015-01-11 DIAGNOSIS — R000 Tachycardia, unspecified: Secondary | ICD-10-CM | POA: Diagnosis not present

## 2015-01-11 DIAGNOSIS — R627 Adult failure to thrive: Secondary | ICD-10-CM | POA: Diagnosis not present

## 2015-01-11 DIAGNOSIS — R0682 Tachypnea, not elsewhere classified: Secondary | ICD-10-CM | POA: Diagnosis not present

## 2015-01-11 DIAGNOSIS — I69321 Dysphasia following cerebral infarction: Secondary | ICD-10-CM | POA: Diagnosis not present

## 2015-01-13 DIAGNOSIS — J189 Pneumonia, unspecified organism: Secondary | ICD-10-CM | POA: Diagnosis not present

## 2015-01-13 DIAGNOSIS — F0390 Unspecified dementia without behavioral disturbance: Secondary | ICD-10-CM | POA: Diagnosis not present

## 2015-01-13 DIAGNOSIS — R627 Adult failure to thrive: Secondary | ICD-10-CM | POA: Diagnosis not present

## 2015-01-13 DIAGNOSIS — R0682 Tachypnea, not elsewhere classified: Secondary | ICD-10-CM | POA: Diagnosis not present

## 2015-01-13 DIAGNOSIS — I69321 Dysphasia following cerebral infarction: Secondary | ICD-10-CM | POA: Diagnosis not present

## 2015-01-13 DIAGNOSIS — R000 Tachycardia, unspecified: Secondary | ICD-10-CM | POA: Diagnosis not present

## 2015-01-17 DIAGNOSIS — I69321 Dysphasia following cerebral infarction: Secondary | ICD-10-CM | POA: Diagnosis not present

## 2015-01-17 DIAGNOSIS — R000 Tachycardia, unspecified: Secondary | ICD-10-CM | POA: Diagnosis not present

## 2015-01-17 DIAGNOSIS — R0682 Tachypnea, not elsewhere classified: Secondary | ICD-10-CM | POA: Diagnosis not present

## 2015-01-17 DIAGNOSIS — R627 Adult failure to thrive: Secondary | ICD-10-CM | POA: Diagnosis not present

## 2015-01-17 DIAGNOSIS — F0390 Unspecified dementia without behavioral disturbance: Secondary | ICD-10-CM | POA: Diagnosis not present

## 2015-01-17 DIAGNOSIS — J189 Pneumonia, unspecified organism: Secondary | ICD-10-CM | POA: Diagnosis not present

## 2015-01-18 DIAGNOSIS — F0390 Unspecified dementia without behavioral disturbance: Secondary | ICD-10-CM | POA: Diagnosis not present

## 2015-01-18 DIAGNOSIS — J189 Pneumonia, unspecified organism: Secondary | ICD-10-CM | POA: Diagnosis not present

## 2015-01-18 DIAGNOSIS — R000 Tachycardia, unspecified: Secondary | ICD-10-CM | POA: Diagnosis not present

## 2015-01-18 DIAGNOSIS — I69321 Dysphasia following cerebral infarction: Secondary | ICD-10-CM | POA: Diagnosis not present

## 2015-01-18 DIAGNOSIS — R0682 Tachypnea, not elsewhere classified: Secondary | ICD-10-CM | POA: Diagnosis not present

## 2015-01-18 DIAGNOSIS — R627 Adult failure to thrive: Secondary | ICD-10-CM | POA: Diagnosis not present

## 2015-01-20 DIAGNOSIS — J189 Pneumonia, unspecified organism: Secondary | ICD-10-CM | POA: Diagnosis not present

## 2015-01-20 DIAGNOSIS — R0682 Tachypnea, not elsewhere classified: Secondary | ICD-10-CM | POA: Diagnosis not present

## 2015-01-20 DIAGNOSIS — F0390 Unspecified dementia without behavioral disturbance: Secondary | ICD-10-CM | POA: Diagnosis not present

## 2015-01-20 DIAGNOSIS — R000 Tachycardia, unspecified: Secondary | ICD-10-CM | POA: Diagnosis not present

## 2015-01-20 DIAGNOSIS — I69321 Dysphasia following cerebral infarction: Secondary | ICD-10-CM | POA: Diagnosis not present

## 2015-01-20 DIAGNOSIS — R627 Adult failure to thrive: Secondary | ICD-10-CM | POA: Diagnosis not present

## 2015-01-25 DIAGNOSIS — J189 Pneumonia, unspecified organism: Secondary | ICD-10-CM | POA: Diagnosis not present

## 2015-01-25 DIAGNOSIS — R627 Adult failure to thrive: Secondary | ICD-10-CM | POA: Diagnosis not present

## 2015-01-25 DIAGNOSIS — R000 Tachycardia, unspecified: Secondary | ICD-10-CM | POA: Diagnosis not present

## 2015-01-25 DIAGNOSIS — I69321 Dysphasia following cerebral infarction: Secondary | ICD-10-CM | POA: Diagnosis not present

## 2015-01-25 DIAGNOSIS — R0682 Tachypnea, not elsewhere classified: Secondary | ICD-10-CM | POA: Diagnosis not present

## 2015-01-25 DIAGNOSIS — F0390 Unspecified dementia without behavioral disturbance: Secondary | ICD-10-CM | POA: Diagnosis not present

## 2015-01-27 DIAGNOSIS — R627 Adult failure to thrive: Secondary | ICD-10-CM | POA: Diagnosis not present

## 2015-01-27 DIAGNOSIS — J189 Pneumonia, unspecified organism: Secondary | ICD-10-CM | POA: Diagnosis not present

## 2015-01-27 DIAGNOSIS — F0390 Unspecified dementia without behavioral disturbance: Secondary | ICD-10-CM | POA: Diagnosis not present

## 2015-01-27 DIAGNOSIS — R0682 Tachypnea, not elsewhere classified: Secondary | ICD-10-CM | POA: Diagnosis not present

## 2015-01-27 DIAGNOSIS — R000 Tachycardia, unspecified: Secondary | ICD-10-CM | POA: Diagnosis not present

## 2015-01-27 DIAGNOSIS — I69321 Dysphasia following cerebral infarction: Secondary | ICD-10-CM | POA: Diagnosis not present

## 2015-02-01 DIAGNOSIS — R627 Adult failure to thrive: Secondary | ICD-10-CM | POA: Diagnosis not present

## 2015-02-01 DIAGNOSIS — R0682 Tachypnea, not elsewhere classified: Secondary | ICD-10-CM | POA: Diagnosis not present

## 2015-02-01 DIAGNOSIS — J189 Pneumonia, unspecified organism: Secondary | ICD-10-CM | POA: Diagnosis not present

## 2015-02-01 DIAGNOSIS — I69321 Dysphasia following cerebral infarction: Secondary | ICD-10-CM | POA: Diagnosis not present

## 2015-02-01 DIAGNOSIS — R000 Tachycardia, unspecified: Secondary | ICD-10-CM | POA: Diagnosis not present

## 2015-02-01 DIAGNOSIS — F0390 Unspecified dementia without behavioral disturbance: Secondary | ICD-10-CM | POA: Diagnosis not present

## 2015-02-03 DIAGNOSIS — R000 Tachycardia, unspecified: Secondary | ICD-10-CM | POA: Diagnosis not present

## 2015-02-03 DIAGNOSIS — F0390 Unspecified dementia without behavioral disturbance: Secondary | ICD-10-CM | POA: Diagnosis not present

## 2015-02-03 DIAGNOSIS — J189 Pneumonia, unspecified organism: Secondary | ICD-10-CM | POA: Diagnosis not present

## 2015-02-03 DIAGNOSIS — I69321 Dysphasia following cerebral infarction: Secondary | ICD-10-CM | POA: Diagnosis not present

## 2015-02-03 DIAGNOSIS — R627 Adult failure to thrive: Secondary | ICD-10-CM | POA: Diagnosis not present

## 2015-02-03 DIAGNOSIS — R0682 Tachypnea, not elsewhere classified: Secondary | ICD-10-CM | POA: Diagnosis not present

## 2015-02-07 DIAGNOSIS — F0390 Unspecified dementia without behavioral disturbance: Secondary | ICD-10-CM | POA: Diagnosis not present

## 2015-02-07 DIAGNOSIS — R000 Tachycardia, unspecified: Secondary | ICD-10-CM | POA: Diagnosis not present

## 2015-02-07 DIAGNOSIS — J189 Pneumonia, unspecified organism: Secondary | ICD-10-CM | POA: Diagnosis not present

## 2015-02-07 DIAGNOSIS — I69321 Dysphasia following cerebral infarction: Secondary | ICD-10-CM | POA: Diagnosis not present

## 2015-02-07 DIAGNOSIS — R627 Adult failure to thrive: Secondary | ICD-10-CM | POA: Diagnosis not present

## 2015-02-07 DIAGNOSIS — R0682 Tachypnea, not elsewhere classified: Secondary | ICD-10-CM | POA: Diagnosis not present

## 2015-02-08 DIAGNOSIS — J189 Pneumonia, unspecified organism: Secondary | ICD-10-CM | POA: Diagnosis not present

## 2015-02-08 DIAGNOSIS — R627 Adult failure to thrive: Secondary | ICD-10-CM | POA: Diagnosis not present

## 2015-02-08 DIAGNOSIS — F0390 Unspecified dementia without behavioral disturbance: Secondary | ICD-10-CM | POA: Diagnosis not present

## 2015-02-08 DIAGNOSIS — R0682 Tachypnea, not elsewhere classified: Secondary | ICD-10-CM | POA: Diagnosis not present

## 2015-02-08 DIAGNOSIS — I69321 Dysphasia following cerebral infarction: Secondary | ICD-10-CM | POA: Diagnosis not present

## 2015-02-08 DIAGNOSIS — R000 Tachycardia, unspecified: Secondary | ICD-10-CM | POA: Diagnosis not present

## 2015-02-09 DIAGNOSIS — R627 Adult failure to thrive: Secondary | ICD-10-CM | POA: Diagnosis not present

## 2015-02-09 DIAGNOSIS — R000 Tachycardia, unspecified: Secondary | ICD-10-CM | POA: Diagnosis not present

## 2015-02-09 DIAGNOSIS — R0682 Tachypnea, not elsewhere classified: Secondary | ICD-10-CM | POA: Diagnosis not present

## 2015-02-09 DIAGNOSIS — I69321 Dysphasia following cerebral infarction: Secondary | ICD-10-CM | POA: Diagnosis not present

## 2015-02-09 DIAGNOSIS — J189 Pneumonia, unspecified organism: Secondary | ICD-10-CM | POA: Diagnosis not present

## 2015-02-09 DIAGNOSIS — F0390 Unspecified dementia without behavioral disturbance: Secondary | ICD-10-CM | POA: Diagnosis not present

## 2015-02-10 DIAGNOSIS — I69321 Dysphasia following cerebral infarction: Secondary | ICD-10-CM | POA: Diagnosis not present

## 2015-02-10 DIAGNOSIS — F0390 Unspecified dementia without behavioral disturbance: Secondary | ICD-10-CM | POA: Diagnosis not present

## 2015-02-10 DIAGNOSIS — J189 Pneumonia, unspecified organism: Secondary | ICD-10-CM | POA: Diagnosis not present

## 2015-02-10 DIAGNOSIS — R627 Adult failure to thrive: Secondary | ICD-10-CM | POA: Diagnosis not present

## 2015-02-10 DIAGNOSIS — R000 Tachycardia, unspecified: Secondary | ICD-10-CM | POA: Diagnosis not present

## 2015-02-10 DIAGNOSIS — R0682 Tachypnea, not elsewhere classified: Secondary | ICD-10-CM | POA: Diagnosis not present

## 2015-02-15 DIAGNOSIS — R0682 Tachypnea, not elsewhere classified: Secondary | ICD-10-CM | POA: Diagnosis not present

## 2015-02-15 DIAGNOSIS — F0390 Unspecified dementia without behavioral disturbance: Secondary | ICD-10-CM | POA: Diagnosis not present

## 2015-02-15 DIAGNOSIS — I69321 Dysphasia following cerebral infarction: Secondary | ICD-10-CM | POA: Diagnosis not present

## 2015-02-15 DIAGNOSIS — R000 Tachycardia, unspecified: Secondary | ICD-10-CM | POA: Diagnosis not present

## 2015-02-15 DIAGNOSIS — J189 Pneumonia, unspecified organism: Secondary | ICD-10-CM | POA: Diagnosis not present

## 2015-02-15 DIAGNOSIS — R627 Adult failure to thrive: Secondary | ICD-10-CM | POA: Diagnosis not present

## 2015-02-17 DIAGNOSIS — F0390 Unspecified dementia without behavioral disturbance: Secondary | ICD-10-CM | POA: Diagnosis not present

## 2015-02-17 DIAGNOSIS — R000 Tachycardia, unspecified: Secondary | ICD-10-CM | POA: Diagnosis not present

## 2015-02-17 DIAGNOSIS — R0682 Tachypnea, not elsewhere classified: Secondary | ICD-10-CM | POA: Diagnosis not present

## 2015-02-17 DIAGNOSIS — I69321 Dysphasia following cerebral infarction: Secondary | ICD-10-CM | POA: Diagnosis not present

## 2015-02-17 DIAGNOSIS — R627 Adult failure to thrive: Secondary | ICD-10-CM | POA: Diagnosis not present

## 2015-02-17 DIAGNOSIS — J189 Pneumonia, unspecified organism: Secondary | ICD-10-CM | POA: Diagnosis not present

## 2015-02-18 DIAGNOSIS — E039 Hypothyroidism, unspecified: Secondary | ICD-10-CM | POA: Diagnosis not present

## 2015-02-18 DIAGNOSIS — K5901 Slow transit constipation: Secondary | ICD-10-CM | POA: Diagnosis not present

## 2015-02-18 DIAGNOSIS — F0390 Unspecified dementia without behavioral disturbance: Secondary | ICD-10-CM | POA: Diagnosis not present

## 2015-02-18 DIAGNOSIS — R5381 Other malaise: Secondary | ICD-10-CM | POA: Diagnosis not present

## 2015-02-18 DIAGNOSIS — R05 Cough: Secondary | ICD-10-CM | POA: Diagnosis not present

## 2015-02-18 DIAGNOSIS — M179 Osteoarthritis of knee, unspecified: Secondary | ICD-10-CM | POA: Diagnosis not present

## 2015-02-18 DIAGNOSIS — R159 Full incontinence of feces: Secondary | ICD-10-CM | POA: Diagnosis not present

## 2015-02-18 DIAGNOSIS — R1310 Dysphagia, unspecified: Secondary | ICD-10-CM | POA: Diagnosis not present

## 2015-02-22 DIAGNOSIS — F0390 Unspecified dementia without behavioral disturbance: Secondary | ICD-10-CM | POA: Diagnosis not present

## 2015-02-22 DIAGNOSIS — J189 Pneumonia, unspecified organism: Secondary | ICD-10-CM | POA: Diagnosis not present

## 2015-02-22 DIAGNOSIS — I69321 Dysphasia following cerebral infarction: Secondary | ICD-10-CM | POA: Diagnosis not present

## 2015-02-22 DIAGNOSIS — R0682 Tachypnea, not elsewhere classified: Secondary | ICD-10-CM | POA: Diagnosis not present

## 2015-02-22 DIAGNOSIS — R627 Adult failure to thrive: Secondary | ICD-10-CM | POA: Diagnosis not present

## 2015-02-22 DIAGNOSIS — R000 Tachycardia, unspecified: Secondary | ICD-10-CM | POA: Diagnosis not present

## 2015-02-24 DIAGNOSIS — R000 Tachycardia, unspecified: Secondary | ICD-10-CM | POA: Diagnosis not present

## 2015-02-24 DIAGNOSIS — J189 Pneumonia, unspecified organism: Secondary | ICD-10-CM | POA: Diagnosis not present

## 2015-02-24 DIAGNOSIS — R627 Adult failure to thrive: Secondary | ICD-10-CM | POA: Diagnosis not present

## 2015-02-24 DIAGNOSIS — R0682 Tachypnea, not elsewhere classified: Secondary | ICD-10-CM | POA: Diagnosis not present

## 2015-02-24 DIAGNOSIS — F0390 Unspecified dementia without behavioral disturbance: Secondary | ICD-10-CM | POA: Diagnosis not present

## 2015-02-24 DIAGNOSIS — I69321 Dysphasia following cerebral infarction: Secondary | ICD-10-CM | POA: Diagnosis not present

## 2015-02-25 DIAGNOSIS — D649 Anemia, unspecified: Secondary | ICD-10-CM | POA: Diagnosis not present

## 2015-02-25 DIAGNOSIS — E789 Disorder of lipoprotein metabolism, unspecified: Secondary | ICD-10-CM | POA: Diagnosis not present

## 2015-02-25 DIAGNOSIS — I69321 Dysphasia following cerebral infarction: Secondary | ICD-10-CM | POA: Diagnosis not present

## 2015-02-25 DIAGNOSIS — E119 Type 2 diabetes mellitus without complications: Secondary | ICD-10-CM | POA: Diagnosis not present

## 2015-02-25 DIAGNOSIS — R627 Adult failure to thrive: Secondary | ICD-10-CM | POA: Diagnosis not present

## 2015-02-25 DIAGNOSIS — F0390 Unspecified dementia without behavioral disturbance: Secondary | ICD-10-CM | POA: Diagnosis not present

## 2015-02-25 DIAGNOSIS — E876 Hypokalemia: Secondary | ICD-10-CM | POA: Diagnosis not present

## 2015-02-25 DIAGNOSIS — N390 Urinary tract infection, site not specified: Secondary | ICD-10-CM | POA: Diagnosis not present

## 2015-02-25 DIAGNOSIS — D890 Polyclonal hypergammaglobulinemia: Secondary | ICD-10-CM | POA: Diagnosis not present

## 2015-02-25 DIAGNOSIS — Z8701 Personal history of pneumonia (recurrent): Secondary | ICD-10-CM | POA: Diagnosis not present

## 2015-02-25 DIAGNOSIS — Z8679 Personal history of other diseases of the circulatory system: Secondary | ICD-10-CM | POA: Diagnosis not present

## 2015-03-01 DIAGNOSIS — Z8679 Personal history of other diseases of the circulatory system: Secondary | ICD-10-CM | POA: Diagnosis not present

## 2015-03-01 DIAGNOSIS — Z8701 Personal history of pneumonia (recurrent): Secondary | ICD-10-CM | POA: Diagnosis not present

## 2015-03-01 DIAGNOSIS — F0390 Unspecified dementia without behavioral disturbance: Secondary | ICD-10-CM | POA: Diagnosis not present

## 2015-03-01 DIAGNOSIS — D890 Polyclonal hypergammaglobulinemia: Secondary | ICD-10-CM | POA: Diagnosis not present

## 2015-03-01 DIAGNOSIS — I69321 Dysphasia following cerebral infarction: Secondary | ICD-10-CM | POA: Diagnosis not present

## 2015-03-01 DIAGNOSIS — R627 Adult failure to thrive: Secondary | ICD-10-CM | POA: Diagnosis not present

## 2015-03-03 DIAGNOSIS — Z8679 Personal history of other diseases of the circulatory system: Secondary | ICD-10-CM | POA: Diagnosis not present

## 2015-03-03 DIAGNOSIS — D890 Polyclonal hypergammaglobulinemia: Secondary | ICD-10-CM | POA: Diagnosis not present

## 2015-03-03 DIAGNOSIS — F0390 Unspecified dementia without behavioral disturbance: Secondary | ICD-10-CM | POA: Diagnosis not present

## 2015-03-03 DIAGNOSIS — I69321 Dysphasia following cerebral infarction: Secondary | ICD-10-CM | POA: Diagnosis not present

## 2015-03-03 DIAGNOSIS — Z8701 Personal history of pneumonia (recurrent): Secondary | ICD-10-CM | POA: Diagnosis not present

## 2015-03-03 DIAGNOSIS — R627 Adult failure to thrive: Secondary | ICD-10-CM | POA: Diagnosis not present

## 2015-03-08 DIAGNOSIS — Z8701 Personal history of pneumonia (recurrent): Secondary | ICD-10-CM | POA: Diagnosis not present

## 2015-03-08 DIAGNOSIS — I69321 Dysphasia following cerebral infarction: Secondary | ICD-10-CM | POA: Diagnosis not present

## 2015-03-08 DIAGNOSIS — Z8679 Personal history of other diseases of the circulatory system: Secondary | ICD-10-CM | POA: Diagnosis not present

## 2015-03-08 DIAGNOSIS — F0390 Unspecified dementia without behavioral disturbance: Secondary | ICD-10-CM | POA: Diagnosis not present

## 2015-03-08 DIAGNOSIS — D890 Polyclonal hypergammaglobulinemia: Secondary | ICD-10-CM | POA: Diagnosis not present

## 2015-03-08 DIAGNOSIS — R627 Adult failure to thrive: Secondary | ICD-10-CM | POA: Diagnosis not present

## 2015-03-10 DIAGNOSIS — D890 Polyclonal hypergammaglobulinemia: Secondary | ICD-10-CM | POA: Diagnosis not present

## 2015-03-10 DIAGNOSIS — R627 Adult failure to thrive: Secondary | ICD-10-CM | POA: Diagnosis not present

## 2015-03-10 DIAGNOSIS — Z8701 Personal history of pneumonia (recurrent): Secondary | ICD-10-CM | POA: Diagnosis not present

## 2015-03-10 DIAGNOSIS — Z8679 Personal history of other diseases of the circulatory system: Secondary | ICD-10-CM | POA: Diagnosis not present

## 2015-03-10 DIAGNOSIS — I69321 Dysphasia following cerebral infarction: Secondary | ICD-10-CM | POA: Diagnosis not present

## 2015-03-10 DIAGNOSIS — F0390 Unspecified dementia without behavioral disturbance: Secondary | ICD-10-CM | POA: Diagnosis not present

## 2015-03-15 DIAGNOSIS — Z8679 Personal history of other diseases of the circulatory system: Secondary | ICD-10-CM | POA: Diagnosis not present

## 2015-03-15 DIAGNOSIS — R627 Adult failure to thrive: Secondary | ICD-10-CM | POA: Diagnosis not present

## 2015-03-15 DIAGNOSIS — Z8701 Personal history of pneumonia (recurrent): Secondary | ICD-10-CM | POA: Diagnosis not present

## 2015-03-15 DIAGNOSIS — I69321 Dysphasia following cerebral infarction: Secondary | ICD-10-CM | POA: Diagnosis not present

## 2015-03-15 DIAGNOSIS — D890 Polyclonal hypergammaglobulinemia: Secondary | ICD-10-CM | POA: Diagnosis not present

## 2015-03-15 DIAGNOSIS — F0390 Unspecified dementia without behavioral disturbance: Secondary | ICD-10-CM | POA: Diagnosis not present

## 2015-03-17 DIAGNOSIS — Z8679 Personal history of other diseases of the circulatory system: Secondary | ICD-10-CM | POA: Diagnosis not present

## 2015-03-17 DIAGNOSIS — D890 Polyclonal hypergammaglobulinemia: Secondary | ICD-10-CM | POA: Diagnosis not present

## 2015-03-17 DIAGNOSIS — R627 Adult failure to thrive: Secondary | ICD-10-CM | POA: Diagnosis not present

## 2015-03-17 DIAGNOSIS — F0390 Unspecified dementia without behavioral disturbance: Secondary | ICD-10-CM | POA: Diagnosis not present

## 2015-03-17 DIAGNOSIS — Z8701 Personal history of pneumonia (recurrent): Secondary | ICD-10-CM | POA: Diagnosis not present

## 2015-03-17 DIAGNOSIS — I69321 Dysphasia following cerebral infarction: Secondary | ICD-10-CM | POA: Diagnosis not present

## 2015-03-29 DIAGNOSIS — R627 Adult failure to thrive: Secondary | ICD-10-CM | POA: Diagnosis not present

## 2015-03-29 DIAGNOSIS — F0390 Unspecified dementia without behavioral disturbance: Secondary | ICD-10-CM | POA: Diagnosis not present

## 2015-03-29 DIAGNOSIS — I69321 Dysphasia following cerebral infarction: Secondary | ICD-10-CM | POA: Diagnosis not present

## 2015-03-29 DIAGNOSIS — D890 Polyclonal hypergammaglobulinemia: Secondary | ICD-10-CM | POA: Diagnosis not present

## 2015-03-29 DIAGNOSIS — Z8679 Personal history of other diseases of the circulatory system: Secondary | ICD-10-CM | POA: Diagnosis not present

## 2015-03-29 DIAGNOSIS — Z8701 Personal history of pneumonia (recurrent): Secondary | ICD-10-CM | POA: Diagnosis not present

## 2015-03-31 DIAGNOSIS — Z8701 Personal history of pneumonia (recurrent): Secondary | ICD-10-CM | POA: Diagnosis not present

## 2015-03-31 DIAGNOSIS — Z8679 Personal history of other diseases of the circulatory system: Secondary | ICD-10-CM | POA: Diagnosis not present

## 2015-03-31 DIAGNOSIS — I69321 Dysphasia following cerebral infarction: Secondary | ICD-10-CM | POA: Diagnosis not present

## 2015-03-31 DIAGNOSIS — F0390 Unspecified dementia without behavioral disturbance: Secondary | ICD-10-CM | POA: Diagnosis not present

## 2015-03-31 DIAGNOSIS — D890 Polyclonal hypergammaglobulinemia: Secondary | ICD-10-CM | POA: Diagnosis not present

## 2015-03-31 DIAGNOSIS — R627 Adult failure to thrive: Secondary | ICD-10-CM | POA: Diagnosis not present

## 2015-04-05 DIAGNOSIS — I69321 Dysphasia following cerebral infarction: Secondary | ICD-10-CM | POA: Diagnosis not present

## 2015-04-05 DIAGNOSIS — R627 Adult failure to thrive: Secondary | ICD-10-CM | POA: Diagnosis not present

## 2015-04-05 DIAGNOSIS — F0390 Unspecified dementia without behavioral disturbance: Secondary | ICD-10-CM | POA: Diagnosis not present

## 2015-04-05 DIAGNOSIS — Z8701 Personal history of pneumonia (recurrent): Secondary | ICD-10-CM | POA: Diagnosis not present

## 2015-04-05 DIAGNOSIS — D890 Polyclonal hypergammaglobulinemia: Secondary | ICD-10-CM | POA: Diagnosis not present

## 2015-04-05 DIAGNOSIS — Z8679 Personal history of other diseases of the circulatory system: Secondary | ICD-10-CM | POA: Diagnosis not present

## 2015-04-07 DIAGNOSIS — Z8679 Personal history of other diseases of the circulatory system: Secondary | ICD-10-CM | POA: Diagnosis not present

## 2015-04-07 DIAGNOSIS — D890 Polyclonal hypergammaglobulinemia: Secondary | ICD-10-CM | POA: Diagnosis not present

## 2015-04-07 DIAGNOSIS — R627 Adult failure to thrive: Secondary | ICD-10-CM | POA: Diagnosis not present

## 2015-04-07 DIAGNOSIS — I69321 Dysphasia following cerebral infarction: Secondary | ICD-10-CM | POA: Diagnosis not present

## 2015-04-07 DIAGNOSIS — F0390 Unspecified dementia without behavioral disturbance: Secondary | ICD-10-CM | POA: Diagnosis not present

## 2015-04-07 DIAGNOSIS — Z8701 Personal history of pneumonia (recurrent): Secondary | ICD-10-CM | POA: Diagnosis not present

## 2015-04-08 DIAGNOSIS — R1310 Dysphagia, unspecified: Secondary | ICD-10-CM | POA: Diagnosis not present

## 2015-04-08 DIAGNOSIS — K5901 Slow transit constipation: Secondary | ICD-10-CM | POA: Diagnosis not present

## 2015-04-08 DIAGNOSIS — R159 Full incontinence of feces: Secondary | ICD-10-CM | POA: Diagnosis not present

## 2015-04-08 DIAGNOSIS — M179 Osteoarthritis of knee, unspecified: Secondary | ICD-10-CM | POA: Diagnosis not present

## 2015-04-08 DIAGNOSIS — F0390 Unspecified dementia without behavioral disturbance: Secondary | ICD-10-CM | POA: Diagnosis not present

## 2015-04-08 DIAGNOSIS — R05 Cough: Secondary | ICD-10-CM | POA: Diagnosis not present

## 2015-04-08 DIAGNOSIS — R5381 Other malaise: Secondary | ICD-10-CM | POA: Diagnosis not present

## 2015-04-08 DIAGNOSIS — E039 Hypothyroidism, unspecified: Secondary | ICD-10-CM | POA: Diagnosis not present

## 2015-04-12 DIAGNOSIS — R627 Adult failure to thrive: Secondary | ICD-10-CM | POA: Diagnosis not present

## 2015-04-12 DIAGNOSIS — I69321 Dysphasia following cerebral infarction: Secondary | ICD-10-CM | POA: Diagnosis not present

## 2015-04-12 DIAGNOSIS — D890 Polyclonal hypergammaglobulinemia: Secondary | ICD-10-CM | POA: Diagnosis not present

## 2015-04-12 DIAGNOSIS — Z8679 Personal history of other diseases of the circulatory system: Secondary | ICD-10-CM | POA: Diagnosis not present

## 2015-04-12 DIAGNOSIS — Z8701 Personal history of pneumonia (recurrent): Secondary | ICD-10-CM | POA: Diagnosis not present

## 2015-04-12 DIAGNOSIS — F0390 Unspecified dementia without behavioral disturbance: Secondary | ICD-10-CM | POA: Diagnosis not present

## 2015-04-13 DIAGNOSIS — R6889 Other general symptoms and signs: Secondary | ICD-10-CM | POA: Diagnosis not present

## 2015-04-14 DIAGNOSIS — Z8701 Personal history of pneumonia (recurrent): Secondary | ICD-10-CM | POA: Diagnosis not present

## 2015-04-14 DIAGNOSIS — I69321 Dysphasia following cerebral infarction: Secondary | ICD-10-CM | POA: Diagnosis not present

## 2015-04-14 DIAGNOSIS — Z8679 Personal history of other diseases of the circulatory system: Secondary | ICD-10-CM | POA: Diagnosis not present

## 2015-04-14 DIAGNOSIS — F0390 Unspecified dementia without behavioral disturbance: Secondary | ICD-10-CM | POA: Diagnosis not present

## 2015-04-14 DIAGNOSIS — R627 Adult failure to thrive: Secondary | ICD-10-CM | POA: Diagnosis not present

## 2015-04-14 DIAGNOSIS — D890 Polyclonal hypergammaglobulinemia: Secondary | ICD-10-CM | POA: Diagnosis not present

## 2015-05-31 ENCOUNTER — Institutional Professional Consult (permissible substitution): Payer: Self-pay | Admitting: Internal Medicine

## 2015-08-18 DIAGNOSIS — E039 Hypothyroidism, unspecified: Secondary | ICD-10-CM | POA: Diagnosis not present

## 2015-08-18 DIAGNOSIS — F0390 Unspecified dementia without behavioral disturbance: Secondary | ICD-10-CM | POA: Diagnosis not present

## 2015-08-18 DIAGNOSIS — Z79899 Other long term (current) drug therapy: Secondary | ICD-10-CM | POA: Diagnosis not present

## 2015-08-18 DIAGNOSIS — R05 Cough: Secondary | ICD-10-CM | POA: Diagnosis not present

## 2015-08-22 ENCOUNTER — Ambulatory Visit
Admission: RE | Admit: 2015-08-22 | Discharge: 2015-08-22 | Disposition: A | Discharge status: Home / Self Care | Payer: Medicare Other | Source: Ambulatory Visit | Attending: Family Medicine | Admitting: Family Medicine

## 2015-08-22 ENCOUNTER — Other Ambulatory Visit: Payer: Self-pay | Admitting: Family Medicine

## 2015-08-22 DIAGNOSIS — R059 Cough, unspecified: Secondary | ICD-10-CM

## 2015-08-22 DIAGNOSIS — R05 Cough: Secondary | ICD-10-CM | POA: Diagnosis not present

## 2016-02-09 DIAGNOSIS — F0390 Unspecified dementia without behavioral disturbance: Secondary | ICD-10-CM | POA: Diagnosis not present

## 2016-02-09 DIAGNOSIS — J309 Allergic rhinitis, unspecified: Secondary | ICD-10-CM | POA: Diagnosis not present

## 2016-02-09 DIAGNOSIS — Z79899 Other long term (current) drug therapy: Secondary | ICD-10-CM | POA: Diagnosis not present

## 2016-02-09 DIAGNOSIS — E039 Hypothyroidism, unspecified: Secondary | ICD-10-CM | POA: Diagnosis not present

## 2016-05-16 ENCOUNTER — Emergency Department (HOSPITAL_COMMUNITY)
Admission: EM | Admit: 2016-05-16 | Discharge: 2016-05-16 | Disposition: A | Discharge status: Home / Self Care | Payer: Medicare Other | Attending: Emergency Medicine | Admitting: Emergency Medicine

## 2016-05-16 ENCOUNTER — Encounter (HOSPITAL_COMMUNITY): Payer: Self-pay | Admitting: Emergency Medicine

## 2016-05-16 DIAGNOSIS — Z7982 Long term (current) use of aspirin: Secondary | ICD-10-CM | POA: Insufficient documentation

## 2016-05-16 DIAGNOSIS — F0390 Unspecified dementia without behavioral disturbance: Secondary | ICD-10-CM | POA: Diagnosis not present

## 2016-05-16 DIAGNOSIS — E860 Dehydration: Secondary | ICD-10-CM | POA: Diagnosis not present

## 2016-05-16 DIAGNOSIS — B379 Candidiasis, unspecified: Secondary | ICD-10-CM | POA: Diagnosis present

## 2016-05-16 DIAGNOSIS — Z87891 Personal history of nicotine dependence: Secondary | ICD-10-CM | POA: Diagnosis not present

## 2016-05-16 LAB — BASIC METABOLIC PANEL
Anion gap: 5 (ref 5–15)
BUN: 20 mg/dL (ref 6–20)
CO2: 24 mmol/L (ref 22–32)
CREATININE: 0.81 mg/dL (ref 0.44–1.00)
Calcium: 9.5 mg/dL (ref 8.9–10.3)
Chloride: 120 mmol/L — ABNORMAL HIGH (ref 101–111)
GFR calc Af Amer: >60 mL/min (ref >60)
GFR calc non Af Amer: >60 mL/min (ref >60)
Glucose, Bld: 101 mg/dL — ABNORMAL HIGH (ref 65–99)
Potassium: 4.3 mmol/L (ref 3.5–5.1)
SODIUM: 149 mmol/L — AB (ref 135–145)

## 2016-05-16 LAB — URINALYSIS, ROUTINE W REFLEX MICROSCOPIC
BILIRUBIN URINE: NEGATIVE
Glucose, UA: NEGATIVE mg/dL
Hgb urine dipstick: NEGATIVE
KETONES UR: NEGATIVE mg/dL
Leukocytes, UA: NEGATIVE
Nitrite: NEGATIVE
Protein, ur: NEGATIVE mg/dL
SPECIFIC GRAVITY, URINE: 1.033 — AB (ref 1.005–1.030)
pH: 5 (ref 5.0–8.0)

## 2016-05-16 LAB — CBC WITH DIFFERENTIAL/PLATELET
BASOS PCT: 0 %
Basophils Absolute: 0 10*3/uL (ref 0.0–0.1)
EOS PCT: 1 %
Eosinophils Absolute: 0 10*3/uL (ref 0.0–0.7)
HCT: 50.8 % — ABNORMAL HIGH (ref 36.0–46.0)
Hemoglobin: 16.1 g/dL — ABNORMAL HIGH (ref 12.0–15.0)
Lymphocytes Relative: 44 %
Lymphs Abs: 2.1 10*3/uL (ref 0.7–4.0)
MCH: 28.3 pg (ref 26.0–34.0)
MCHC: 31.7 g/dL (ref 30.0–36.0)
MCV: 89.3 fL (ref 78.0–100.0)
Monocytes Absolute: 0.4 10*3/uL (ref 0.1–1.0)
Monocytes Relative: 8 %
NEUTROS ABS: 2.3 10*3/uL (ref 1.7–7.7)
Neutrophils Relative %: 47 %
Platelets: UNDETERMINED 10*3/uL (ref 150–400)
RBC: 5.69 MIL/uL — ABNORMAL HIGH (ref 3.87–5.11)
RDW: 15.8 % — AB (ref 11.5–15.5)
WBC: 4.8 10*3/uL (ref 4.0–10.5)

## 2016-05-16 MED ORDER — THIAMINE HCL 100 MG/ML IJ SOLN
100.0000 mg | Freq: Once | INTRAMUSCULAR | Status: AC
  Administered 2016-05-16: 100 mg via INTRAVENOUS
  Filled 2016-05-16: qty 2

## 2016-05-16 MED ORDER — SODIUM CHLORIDE 0.9 % IV BOLUS (SEPSIS)
1000.0000 mL | Freq: Once | INTRAVENOUS | Status: AC
  Administered 2016-05-16: 1000 mL via INTRAVENOUS

## 2016-05-16 NOTE — ED Triage Notes (Signed)
Per family, pt was diagnosed with thrush. Pt taking nystatin for it. Pt continues to not eat and c/o difficulty swallowing. Family states "she frowns when she smiles."  Family doing the talking for pt. Pt not answering any questions.

## 2016-05-16 NOTE — Discharge Instructions (Signed)
Encourage Kristi Webb to eat and drink as much as possible. Call Dr. Jillyn HiddenFulp to schedule an office visit next week. Return if she doesn't urinate every 4-6 hours or one drink or if your concerned for any reason

## 2016-05-16 NOTE — ED Notes (Signed)
Pt left with family in her wheelchair.  No reaction to medications noted at discharge.

## 2016-05-16 NOTE — ED Provider Notes (Signed)
[facility]EMERGENCY DEPT Provider Note   CSN: [id number] Arrival date & time: 05/16/16  1548     History   Chief Complaint Chief Complaint  Patient presents with  . Thrush  . Sore Throat  Level V caveat history is obtained from patient's daughter and granddaughter  HPI Kristi Webb is a 82 y.o. female.Patient was diagnosed with oral thrush by her primary care physician 8 days ago. She's been treated with nystatin oral medication which she's taken for the past 5 days. She did eat yogurt and applesauce this morning. She last urinated at noon today. Her daughter and granddaughter concerned that she had diminished oral intake. Last bowel movement earlier today, small amount.  HPI  Past Medical History:  Diagnosis Date  . Dementia    worse since 2012  . FTT (failure to thrive) in adult   . 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 SDH (subdural hematoma) (HCC)    b/l 11/2011 at [facility] placed on keppra 250 mg bid   . Thrush   . Thyroid disease     Patient Active Problem List   Diagnosis Date Noted  . Aspiration pneumonia (HCC) 12/24/2013  . FUO (fever of unknown origin) 12/23/2013  . Hypothyroidism 12/23/2013  . Aspiration pneumonitis (HCC) 07/06/2012  . Malnutrition (HCC) 05/29/2012  . Acute hypernatremia 05/28/2012  . Leukocytosis 05/28/2012  . Sacral decubitus ulcer, stage II 05/28/2012  . Altered mental status 05/28/2012  . Dementia 05/28/2012  . Thyroid disorder 05/28/2012  . Thrush 05/28/2012  . FTT (failure to thrive) in adult 05/28/2012  . Tachypnea 05/28/2012  . Tachycardia 05/28/2012    Past Surgical History:  Procedure Laterality Date  . OTHER SURGICAL HISTORY  04/21/2011   left hip internal fixation and reduction intertrochanteric and femoral neck  . OTHER SURGICAL HISTORY  ~2 yrs ago   brain surgery    OB History    No data available       Home Medications    Prior to Admission medications   Medication Sig Start Date End Date Taking? Authorizing Provider    acetaminophen (TYLENOL) 500 MG tablet Take 500-1,000 mg by mouth every 6 (six) hours as needed for mild pain, moderate pain or fever.    Historical Provider, MD  amoxicillin-clavulanate (AUGMENTIN) 875-125 MG per tablet Take 1 tablet by mouth 2 (two) times daily. Patient not taking: Reported on 11/10/2014 12/26/13   Maryruth Bun Rama, MD  aspirin EC 81 MG tablet Take 81 mg by mouth daily.    Historical Provider, MD  bisacodyl (DULCOLAX) 5 MG EC tablet Take 1 tablet (5 mg total) by mouth daily as needed for moderate constipation. Patient not taking: Reported on 11/10/2014 12/26/13   Maryruth Bun Rama, MD  CALCIUM-MAGNESIUM-ZINC PO Take 1 tablet by mouth daily. In the evening    Historical Provider, MD  cefpodoxime (VANTIN) 100 MG tablet Take 1 tablet (100 mg total) by mouth 2 (two) times daily. 11/10/14   Mirian Mo, MD  GuaiFENesin (MUCINEX PO) Take 2 tablets by mouth daily as needed (mucus and cough).    Historical Provider, MD  levothyroxine (SYNTHROID, LEVOTHROID) 25 MCG tablet Take 25 mcg by mouth every morning.     Historical Provider, MD  memantine (NAMENDA XR) 28 MG CP24 24 hr capsule Take 1 tablet by mouth daily. 11/30/14   Historical Provider, MD  Multiple Vitamin (MULTIVITAMIN WITH MINERALS) TABS Take 1 tablet by mouth daily.     Historical Provider, MD  nystatin (MYCOSTATIN) 100000 UNIT/ML  suspension Take 5 mLs (500,000 Units total) by mouth 4 (four) times daily. Patient not taking: Reported on 11/10/2014 12/26/13   Maryruth Bunhristina P Rama, MD  potassium chloride (K-DUR,KLOR-CON) 10 MEQ tablet Take 10 mEq by mouth 2 (two) times daily.    Historical Provider, MD  vitamin C (ASCORBIC ACID) 500 MG tablet Take 500 mg by mouth every evening.    Historical Provider, MD    Family History Family History  Problem Relation Age of Onset  . Hypertension      daughter 1(living)  . Suicidality      daughter 2    Social History Social History  Substance Use Topics  . Smoking status: Former Smoker     Packs/day: 1.00    Years: 40.00    Types: Cigarettes  . Smokeless tobacco: Never Used  . Alcohol use No     Allergies   Review of patient's allergies indicates no known allergies.   Review of Systems Review of Systems  Unable to perform ROS: Dementia  Constitutional: Positive for appetite change.  Musculoskeletal: Positive for gait problem.       Wheelchair-bound  Neurological: Positive for speech difficulty.       Nonverbal     Physical Exam Updated Vital Signs BP 107/69 (BP Location: Left Arm)   Pulse 85   Temp 98.4 F (36.9 C) (Oral)   Resp 16   SpO2 94%   Physical Exam  Constitutional:  Chronically ill-appearing  HENT:  Head: Normocephalic and atraumatic.  Because membranes dry. No oral lesions. No thrush noted  Eyes: Conjunctivae are normal. Pupils are equal, round, and reactive to light.  Neck: Neck supple. No tracheal deviation present. No thyromegaly present.  Cardiovascular: Normal rate and regular rhythm.   No murmur heard. Pulmonary/Chest: Effort normal and breath sounds normal.  Abdominal: Soft. Bowel sounds are normal. She exhibits no distension. There is no tenderness.  Genitourinary:  Genitourinary Comments: Soft brown stool no gross blood  Musculoskeletal: Normal range of motion. She exhibits no edema or tenderness.  Neurological: She is alert. Coordination normal.  Skin: Skin is warm and dry. Capillary refill takes less than 2 seconds. No rash noted.  Nursing note and vitals reviewed.    ED Treatments / Results  Labs (all labs ordered are listed, but only abnormal results are displayed) Labs Reviewed  CBC WITH DIFFERENTIAL/PLATELET  URINALYSIS, ROUTINE W REFLEX MICROSCOPIC (NOT AT [facility])  Patient is awake and alert and looks much improved family after treatment with intravenous hydration . She ate a full meal while here  EKG  EKG Interpretation None       Radiology No results found.  Procedures Procedures (including critical care  time)  Medications Ordered in ED Medications  sodium chloride 0.9 % bolus 1,000 mL (not administered)  thiamine (B-1) injection 100 mg (not administered)   Results for orders placed or performed during the hospital encounter of 05/16/16  CBC with Differential/Platelet  Result Value Ref Range   WBC 4.8 4.0 - 10.5 K/uL   RBC 5.69 (H) 3.87 - 5.11 MIL/uL   Hemoglobin 16.1 (H) 12.0 - 15.0 g/dL   HCT 16.150.8 (H) 09.636.0 - 04.546.0 %   MCV 89.3 78.0 - 100.0 fL   MCH 28.3 26.0 - 34.0 pg   MCHC 31.7 30.0 - 36.0 g/dL   RDW 40.915.8 (H) 81.111.5 - 91.415.5 %   Platelets PLATELET CLUMPS NOTED ON SMEAR, UNABLE TO ESTIMATE 150 - 400 K/uL   Neutrophils Relative % 47 %  Lymphocytes Relative 44 %   Monocytes Relative 8 %   Eosinophils Relative 1 %   Basophils Relative 0 %   Neutro Abs 2.3 1.7 - 7.7 K/uL   Lymphs Abs 2.1 0.7 - 4.0 K/uL   Monocytes Absolute 0.4 0.1 - 1.0 K/uL   Eosinophils Absolute 0.0 0.0 - 0.7 K/uL   Basophils Absolute 0.0 0.0 - 0.1 K/uL  Urinalysis, Routine w reflex microscopic (not at [facility])  Result Value Ref Range   Color, Urine AMBER (A) YELLOW   APPearance CLOUDY (A) CLEAR   Specific Gravity, Urine 1.033 (H) 1.005 - 1.030   pH 5.0 5.0 - 8.0   Glucose, UA NEGATIVE NEGATIVE mg/dL   Hgb urine dipstick NEGATIVE NEGATIVE   Bilirubin Urine NEGATIVE NEGATIVE   Ketones, ur NEGATIVE NEGATIVE mg/dL   Protein, ur NEGATIVE NEGATIVE mg/dL   Nitrite NEGATIVE NEGATIVE   Leukocytes, UA NEGATIVE NEGATIVE  Basic metabolic panel  Result Value Ref Range   Sodium 149 (H) 135 - 145 mmol/L   Potassium 4.3 3.5 - 5.1 mmol/L   Chloride 120 (H) 101 - 111 mmol/L   CO2 24 22 - 32 mmol/L   Glucose, Bld 101 (H) 65 - 99 mg/dL   BUN 20 6 - 20 mg/dL   Creatinine, Ser 0.450.81 0.44 - 1.00 mg/dL   Calcium 9.5 8.9 - 40.910.3 mg/dL   GFR calc non Af Amer >60 >60 mL/min   GFR calc Af Amer >60 >60 mL/min   Anion gap 5 5 - 15   No results found.   Initial Impression / Assessment and Plan / ED Course  I have reviewed the  triage vital signs and the nursing notes.  Pertinent labs & imaging results that were available during my care of the patient were reviewed by me and considered in my medical decision making (see chart for details). . Clinical Course   I don't see any evidence of oral thrush and patient appears comfortable  Plan encourage oral hydration. Follow up with Dr.Fulp as needed  Final Clinical Impressions(s) / ED Diagnoses  Diagnosis dehydration Final diagnoses:  None    New Prescriptions New Prescriptions   No medications on file     Doug SouSam Samar Dass, MD 05/16/16 2022

## 2016-09-14 ENCOUNTER — Emergency Department (HOSPITAL_COMMUNITY)
Admission: EM | Admit: 2016-09-14 | Discharge: 2016-09-14 | Disposition: A | Discharge status: Home / Self Care | Payer: Medicare Other | Attending: Emergency Medicine | Admitting: Emergency Medicine

## 2016-09-14 ENCOUNTER — Encounter (HOSPITAL_COMMUNITY): Payer: Self-pay

## 2016-09-14 DIAGNOSIS — L89151 Pressure ulcer of sacral region, stage 1: Secondary | ICD-10-CM | POA: Diagnosis not present

## 2016-09-14 DIAGNOSIS — Z79899 Other long term (current) drug therapy: Secondary | ICD-10-CM | POA: Diagnosis not present

## 2016-09-14 DIAGNOSIS — E039 Hypothyroidism, unspecified: Secondary | ICD-10-CM | POA: Diagnosis not present

## 2016-09-14 DIAGNOSIS — Z7982 Long term (current) use of aspirin: Secondary | ICD-10-CM | POA: Diagnosis not present

## 2016-09-14 DIAGNOSIS — Z87891 Personal history of nicotine dependence: Secondary | ICD-10-CM | POA: Insufficient documentation

## 2016-09-14 DIAGNOSIS — L97909 Non-pressure chronic ulcer of unspecified part of unspecified lower leg with unspecified severity: Secondary | ICD-10-CM | POA: Diagnosis present

## 2016-09-14 LAB — CBG MONITORING, ED
Glucose-Capillary: 59 mg/dL — ABNORMAL LOW (ref 65–99)
Glucose-Capillary: 66 mg/dL (ref 65–99)

## 2016-09-14 NOTE — Progress Notes (Signed)
This pt previously seen by ED CM in March 2016 and assisted with referral to [facility], DME and transportation assistance Pt living with daughter Kristi Webb who was reported to stay with pt at night and a caregiver who stays with pt during the day Kristi Webb- pt with DME in home already  Pt is covered by medicare, no medicaid indicated on this ED encounter PCP listed as Kristi Webb [facility] per [employer]

## 2016-09-14 NOTE — Progress Notes (Signed)
Spoke with pt, daughter, Larene BeachVickie and caregiver in pt room about Home health services referral for wound care nurse

## 2016-09-14 NOTE — Discharge Instructions (Signed)
[facility] [address], [place], [place] [address] [phone]

## 2016-09-14 NOTE — ED Provider Notes (Signed)
[facility]-EMERGENCY DEPT Provider Note   CSN: [id number] Arrival date & time: 09/14/16  1412     History   Chief Complaint Chief Complaint  Patient presents with  . Skin Ulcer    HPI Kristi Webb is a 82 y.o. female with history of dementia and is nonverbal who presents with a 4 week history of decubitus ulcers and a three-day history of diarrhea. Patient is bedridden. Patient is taken care of by CNA's at home, as well as daughter. Patient has a new floating mattress to help prevent these. The daughter reports that the patient appears uncomfortable as she shakes her weight off.  HPI  Past Medical History:  Diagnosis Date  . Dementia    worse since 2012  . FTT (failure to thrive) in adult   . 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 SDH (subdural hematoma) (HCC)    [facility] on keppra 250 mg bid   . Thrush   . Thyroid disease     Patient Active Problem List   Diagnosis Date Noted  . Aspiration pneumonia (HCC) 12/24/2013  . FUO (fever of unknown origin) 12/23/2013  . Hypothyroidism 12/23/2013  . Aspiration pneumonitis (HCC) 07/06/2012  . Malnutrition (HCC) 05/29/2012  . Acute hypernatremia 05/28/2012  . Leukocytosis 05/28/2012  . Sacral decubitus ulcer, stage II 05/28/2012  . Altered mental status 05/28/2012  . Dementia 05/28/2012  . Thyroid disorder 05/28/2012  . Thrush 05/28/2012  . FTT (failure to thrive) in adult 05/28/2012  . Tachypnea 05/28/2012  . Tachycardia 05/28/2012    Past Surgical History:  Procedure Laterality Date  . OTHER SURGICAL HISTORY  04/21/2011   left hip internal fixation and reduction intertrochanteric and femoral neck  . OTHER SURGICAL HISTORY  ~2 yrs ago   brain surgery    OB History    No data available       Home Medications    Prior to Admission medications   Medication Sig Start Date End Date Taking? Authorizing Provider  acetaminophen (TYLENOL) 500 MG tablet Take 500-1,000 mg by mouth every 6 (six) hours as needed  for mild pain, moderate pain or fever.    Historical Provider, MD  amoxicillin-clavulanate (AUGMENTIN) 875-125 MG per tablet Take 1 tablet by mouth 2 (two) times daily. Patient not taking: Reported on 11/10/2014 12/26/13   Maryruth Bunhristina P Rama, MD  aspirin EC 81 MG tablet Take 81 mg by mouth daily.    Historical Provider, MD  bisacodyl (DULCOLAX) 5 MG EC tablet Take 1 tablet (5 mg total) by mouth daily as needed for moderate constipation. Patient not taking: Reported on 11/10/2014 12/26/13   Maryruth Bunhristina P Rama, MD  CALCIUM-MAGNESIUM-ZINC PO Take 1 tablet by mouth daily. In the evening    Historical Provider, MD  cefpodoxime (VANTIN) 100 MG tablet Take 1 tablet (100 mg total) by mouth 2 (two) times daily. 11/10/14   Mirian MoMatthew Gentry, MD  GuaiFENesin (MUCINEX PO) Take 2 tablets by mouth daily as needed (mucus and cough).    Historical Provider, MD  levothyroxine (SYNTHROID, LEVOTHROID) 25 MCG tablet Take 25 mcg by mouth every morning.     Historical Provider, MD  memantine (NAMENDA XR) 28 MG CP24 24 hr capsule Take 1 tablet by mouth daily. 11/30/14   Historical Provider, MD  Multiple Vitamin (MULTIVITAMIN WITH MINERALS) TABS Take 1 tablet by mouth daily.     Historical Provider, MD  nystatin (MYCOSTATIN) 100000 UNIT/ML suspension Take 5 mLs (500,000 Units total) by mouth 4 (four)  times daily. Patient not taking: Reported on 11/10/2014 12/26/13   Maryruth Bunhristina P Rama, MD  potassium chloride (K-DUR,KLOR-CON) 10 MEQ tablet Take 10 mEq by mouth 2 (two) times daily.    Historical Provider, MD  vitamin C (ASCORBIC ACID) 500 MG tablet Take 500 mg by mouth every evening.    Historical Provider, MD    Family History Family History  Problem Relation Age of Onset  . Hypertension      daughter 1(living)  . Suicidality      daughter 2    Social History Social History  Substance Use Topics  . Smoking status: Former Smoker    Packs/day: 1.00    Years: 40.00    Types: Cigarettes  . Smokeless tobacco: Never Used  .  Alcohol use No     Allergies   Patient has no known allergies.   Review of Systems Review of Systems  Unable to perform ROS: Dementia (Nonverbal)     Physical Exam Updated Vital Signs BP (!) 105/52 (BP Location: Right Arm)   Pulse 78   Resp 18   Ht 5\' 4"  (1.626 m)   Wt 83.9 kg   SpO2 97%   BMI 31.76 kg/m   Physical Exam  Constitutional: She appears well-developed and well-nourished. No distress.  HENT:  Head: Normocephalic and atraumatic.  Mouth/Throat: Oropharynx is clear and moist. No oropharyngeal exudate.  Eyes: Conjunctivae are normal. Pupils are equal, round, and reactive to light. Right eye exhibits no discharge. Left eye exhibits no discharge. No scleral icterus.  Neck: Normal range of motion. Neck supple. No thyromegaly present.  Cardiovascular: Normal rate, regular rhythm, normal heart sounds and intact distal pulses.  Exam reveals no gallop and no friction rub.   No murmur heard. Pulmonary/Chest: Effort normal and breath sounds normal. No stridor. No respiratory distress. She has no wheezes. She has no rales.  Abdominal: Soft. Bowel sounds are normal. She exhibits no distension. There is no tenderness. There is no rebound and no guarding.  Musculoskeletal: She exhibits no edema.  Lymphadenopathy:    She has no cervical adenopathy.  Neurological: She is alert. Coordination normal.  Skin: Skin is warm and dry. No rash noted. She is not diaphoretic. No pallor.  Bilateral stage 2 sacral decubitus ulcers  Psychiatric: She has a normal mood and affect.  Nursing note and vitals reviewed.    ED Treatments / Results  Labs (all labs ordered are listed, but only abnormal results are displayed) Labs Reviewed  CBG MONITORING, ED - Abnormal; Notable for the following:       Result Value   Glucose-Capillary 59 (*)    All other components within normal limits  CBG MONITORING, ED    EKG  EKG Interpretation None       Radiology No results  found.  Procedures Procedures (including critical care time)  Medications Ordered in ED Medications - No data to display   Initial Impression / Assessment and Plan / ED Course  I have reviewed the triage vital signs and the nursing notes.  Pertinent labs & imaging results that were available during my care of the patient were reviewed by me and considered in my medical decision making (see chart for details).  Clinical Course     Patient with majority stage I, small (<1cm^2) stage II sacral decubitus ulcers bilaterally. Wound care provided including padded dressing and barrier cream. Case management consulted who is arranging for wound care at home. Patient's diarrhea in the related to recent change  in diet (family states they fed the patient a lot of greens recently) or virus. Patient had not eaten today prior to CBG of 59. Patient given applesauce and other snacks with increase of CBG. No labs were drawn due to difficulty obtaining is accessed, however it myself and Dr. Anitra Lauth do not believe they're necessary at this time. Patient follow-up with PCP in 3 days. Return precautions discussed. Family understands and agrees with plan. Patient discharged in satisfactory condition. Patient also evaluated by Dr. Anitra Lauth who guided the patient's management and agrees with plan.  Final Clinical Impressions(s) / ED Diagnoses   Final diagnoses:  Decubitus ulcer of sacral region, stage 1    New Prescriptions New Prescriptions   No medications on file     Emi Holes, PA-C 09/14/16 1829    Gwyneth Sprout, MD 09/14/16 2259

## 2016-09-14 NOTE — Progress Notes (Signed)
ED CM called and spoke with ED PA/NP Orders in [facility]01 Spoke with Jerm[facility] of [facility] at [phone] to call in referral for home health RN for wound care

## 2016-09-14 NOTE — Progress Notes (Signed)
Entered in d/c instructions [facility]   [phone] [address] [place] [address]    Next Steps: Call on 09/14/2016    Instructions: as needed  Home health nurse referral for wound care initiated for you at the ED You will receive a call from a home health staff but can call them if needed

## 2016-09-14 NOTE — ED Notes (Signed)
Pt given orange juice and applesauce for blood sugar

## 2016-09-14 NOTE — ED Notes (Signed)
This RN stuck pt for blood draw x 2 unsuccessfully

## 2016-09-14 NOTE — ED Triage Notes (Signed)
Pt is bed ridden.  Per caregivers, for past 4 weeks she has had blanching/skin wounds that they have not been able to manage.  Usually able to manage at home.  Pt wounds are on her bottom.

## 2016-09-16 DIAGNOSIS — F0390 Unspecified dementia without behavioral disturbance: Secondary | ICD-10-CM | POA: Diagnosis not present

## 2016-09-16 DIAGNOSIS — Z7982 Long term (current) use of aspirin: Secondary | ICD-10-CM | POA: Diagnosis not present

## 2016-09-16 DIAGNOSIS — L89312 Pressure ulcer of right buttock, stage 2: Secondary | ICD-10-CM | POA: Diagnosis not present

## 2016-09-16 DIAGNOSIS — L89322 Pressure ulcer of left buttock, stage 2: Secondary | ICD-10-CM | POA: Diagnosis not present

## 2016-09-16 DIAGNOSIS — R627 Adult failure to thrive: Secondary | ICD-10-CM | POA: Diagnosis not present

## 2016-09-16 DIAGNOSIS — D890 Polyclonal hypergammaglobulinemia: Secondary | ICD-10-CM | POA: Diagnosis not present

## 2016-09-20 DIAGNOSIS — Z7982 Long term (current) use of aspirin: Secondary | ICD-10-CM | POA: Diagnosis not present

## 2016-09-20 DIAGNOSIS — F0390 Unspecified dementia without behavioral disturbance: Secondary | ICD-10-CM | POA: Diagnosis not present

## 2016-09-20 DIAGNOSIS — R627 Adult failure to thrive: Secondary | ICD-10-CM | POA: Diagnosis not present

## 2016-09-20 DIAGNOSIS — L89312 Pressure ulcer of right buttock, stage 2: Secondary | ICD-10-CM | POA: Diagnosis not present

## 2016-09-20 DIAGNOSIS — L89322 Pressure ulcer of left buttock, stage 2: Secondary | ICD-10-CM | POA: Diagnosis not present

## 2016-09-20 DIAGNOSIS — D890 Polyclonal hypergammaglobulinemia: Secondary | ICD-10-CM | POA: Diagnosis not present

## 2016-09-26 DIAGNOSIS — F0390 Unspecified dementia without behavioral disturbance: Secondary | ICD-10-CM | POA: Diagnosis not present

## 2016-09-26 DIAGNOSIS — R627 Adult failure to thrive: Secondary | ICD-10-CM | POA: Diagnosis not present

## 2016-09-26 DIAGNOSIS — L89322 Pressure ulcer of left buttock, stage 2: Secondary | ICD-10-CM | POA: Diagnosis not present

## 2016-09-26 DIAGNOSIS — Z7982 Long term (current) use of aspirin: Secondary | ICD-10-CM | POA: Diagnosis not present

## 2016-09-26 DIAGNOSIS — D890 Polyclonal hypergammaglobulinemia: Secondary | ICD-10-CM | POA: Diagnosis not present

## 2016-09-26 DIAGNOSIS — L89312 Pressure ulcer of right buttock, stage 2: Secondary | ICD-10-CM | POA: Diagnosis not present

## 2016-10-03 DIAGNOSIS — L89322 Pressure ulcer of left buttock, stage 2: Secondary | ICD-10-CM | POA: Diagnosis not present

## 2016-10-03 DIAGNOSIS — L89312 Pressure ulcer of right buttock, stage 2: Secondary | ICD-10-CM | POA: Diagnosis not present

## 2016-10-03 DIAGNOSIS — Z7982 Long term (current) use of aspirin: Secondary | ICD-10-CM | POA: Diagnosis not present

## 2016-10-03 DIAGNOSIS — D890 Polyclonal hypergammaglobulinemia: Secondary | ICD-10-CM | POA: Diagnosis not present

## 2016-10-03 DIAGNOSIS — R627 Adult failure to thrive: Secondary | ICD-10-CM | POA: Diagnosis not present

## 2016-10-03 DIAGNOSIS — F0390 Unspecified dementia without behavioral disturbance: Secondary | ICD-10-CM | POA: Diagnosis not present

## 2016-10-05 DIAGNOSIS — Z872 Personal history of diseases of the skin and subcutaneous tissue: Secondary | ICD-10-CM | POA: Diagnosis not present

## 2016-10-05 DIAGNOSIS — E876 Hypokalemia: Secondary | ICD-10-CM | POA: Diagnosis not present

## 2016-10-05 DIAGNOSIS — J309 Allergic rhinitis, unspecified: Secondary | ICD-10-CM | POA: Diagnosis not present

## 2016-10-05 DIAGNOSIS — E039 Hypothyroidism, unspecified: Secondary | ICD-10-CM | POA: Diagnosis not present

## 2016-10-05 DIAGNOSIS — F0390 Unspecified dementia without behavioral disturbance: Secondary | ICD-10-CM | POA: Diagnosis not present

## 2016-10-05 DIAGNOSIS — Z23 Encounter for immunization: Secondary | ICD-10-CM | POA: Diagnosis not present

## 2016-10-10 DIAGNOSIS — L89322 Pressure ulcer of left buttock, stage 2: Secondary | ICD-10-CM | POA: Diagnosis not present

## 2016-10-10 DIAGNOSIS — Z7982 Long term (current) use of aspirin: Secondary | ICD-10-CM | POA: Diagnosis not present

## 2016-10-10 DIAGNOSIS — L89312 Pressure ulcer of right buttock, stage 2: Secondary | ICD-10-CM | POA: Diagnosis not present

## 2016-10-10 DIAGNOSIS — F0390 Unspecified dementia without behavioral disturbance: Secondary | ICD-10-CM | POA: Diagnosis not present

## 2016-10-10 DIAGNOSIS — R627 Adult failure to thrive: Secondary | ICD-10-CM | POA: Diagnosis not present

## 2016-10-10 DIAGNOSIS — D890 Polyclonal hypergammaglobulinemia: Secondary | ICD-10-CM | POA: Diagnosis not present

## 2016-11-01 DIAGNOSIS — L89312 Pressure ulcer of right buttock, stage 2: Secondary | ICD-10-CM | POA: Diagnosis not present

## 2016-11-01 DIAGNOSIS — F0390 Unspecified dementia without behavioral disturbance: Secondary | ICD-10-CM | POA: Diagnosis not present

## 2016-11-01 DIAGNOSIS — D890 Polyclonal hypergammaglobulinemia: Secondary | ICD-10-CM | POA: Diagnosis not present

## 2016-11-01 DIAGNOSIS — Z7982 Long term (current) use of aspirin: Secondary | ICD-10-CM | POA: Diagnosis not present

## 2016-11-01 DIAGNOSIS — L89322 Pressure ulcer of left buttock, stage 2: Secondary | ICD-10-CM | POA: Diagnosis not present

## 2016-11-01 DIAGNOSIS — R627 Adult failure to thrive: Secondary | ICD-10-CM | POA: Diagnosis not present

## 2016-11-07 DIAGNOSIS — F0390 Unspecified dementia without behavioral disturbance: Secondary | ICD-10-CM | POA: Diagnosis not present

## 2016-11-07 DIAGNOSIS — L89312 Pressure ulcer of right buttock, stage 2: Secondary | ICD-10-CM | POA: Diagnosis not present

## 2016-11-07 DIAGNOSIS — Z7982 Long term (current) use of aspirin: Secondary | ICD-10-CM | POA: Diagnosis not present

## 2016-11-07 DIAGNOSIS — L89322 Pressure ulcer of left buttock, stage 2: Secondary | ICD-10-CM | POA: Diagnosis not present

## 2016-11-07 DIAGNOSIS — D890 Polyclonal hypergammaglobulinemia: Secondary | ICD-10-CM | POA: Diagnosis not present

## 2016-11-07 DIAGNOSIS — R627 Adult failure to thrive: Secondary | ICD-10-CM | POA: Diagnosis not present

## 2016-11-08 DIAGNOSIS — R531 Weakness: Secondary | ICD-10-CM | POA: Diagnosis not present

## 2016-11-08 DIAGNOSIS — D890 Polyclonal hypergammaglobulinemia: Secondary | ICD-10-CM | POA: Diagnosis not present

## 2016-11-08 DIAGNOSIS — L89322 Pressure ulcer of left buttock, stage 2: Secondary | ICD-10-CM | POA: Diagnosis not present

## 2016-11-08 DIAGNOSIS — Z7982 Long term (current) use of aspirin: Secondary | ICD-10-CM | POA: Diagnosis not present

## 2016-11-08 DIAGNOSIS — R404 Transient alteration of awareness: Secondary | ICD-10-CM | POA: Diagnosis not present

## 2016-11-08 DIAGNOSIS — L89312 Pressure ulcer of right buttock, stage 2: Secondary | ICD-10-CM | POA: Diagnosis not present

## 2016-11-08 DIAGNOSIS — R627 Adult failure to thrive: Secondary | ICD-10-CM | POA: Diagnosis not present

## 2016-11-08 DIAGNOSIS — F0390 Unspecified dementia without behavioral disturbance: Secondary | ICD-10-CM | POA: Diagnosis not present

## 2016-11-14 DIAGNOSIS — D890 Polyclonal hypergammaglobulinemia: Secondary | ICD-10-CM | POA: Diagnosis not present

## 2016-11-14 DIAGNOSIS — F0390 Unspecified dementia without behavioral disturbance: Secondary | ICD-10-CM | POA: Diagnosis not present

## 2016-11-14 DIAGNOSIS — R627 Adult failure to thrive: Secondary | ICD-10-CM | POA: Diagnosis not present

## 2016-11-14 DIAGNOSIS — L89322 Pressure ulcer of left buttock, stage 2: Secondary | ICD-10-CM | POA: Diagnosis not present

## 2016-11-14 DIAGNOSIS — L89312 Pressure ulcer of right buttock, stage 2: Secondary | ICD-10-CM | POA: Diagnosis not present

## 2016-11-14 DIAGNOSIS — Z7982 Long term (current) use of aspirin: Secondary | ICD-10-CM | POA: Diagnosis not present

## 2017-05-06 DIAGNOSIS — E039 Hypothyroidism, unspecified: Secondary | ICD-10-CM | POA: Diagnosis not present

## 2017-05-06 DIAGNOSIS — F0390 Unspecified dementia without behavioral disturbance: Secondary | ICD-10-CM | POA: Diagnosis not present

## 2017-05-06 DIAGNOSIS — J309 Allergic rhinitis, unspecified: Secondary | ICD-10-CM | POA: Diagnosis not present

## 2017-05-06 DIAGNOSIS — B351 Tinea unguium: Secondary | ICD-10-CM | POA: Diagnosis not present

## 2017-05-06 DIAGNOSIS — J0100 Acute maxillary sinusitis, unspecified: Secondary | ICD-10-CM | POA: Diagnosis not present

## 2017-05-06 DIAGNOSIS — E876 Hypokalemia: Secondary | ICD-10-CM | POA: Diagnosis not present

## 2017-05-06 DIAGNOSIS — Z79899 Other long term (current) drug therapy: Secondary | ICD-10-CM | POA: Diagnosis not present

## 2017-06-11 ENCOUNTER — Ambulatory Visit: Payer: Medicare Other | Admitting: Podiatry

## 2017-10-23 ENCOUNTER — Ambulatory Visit (INDEPENDENT_AMBULATORY_CARE_PROVIDER_SITE_OTHER): Payer: Medicare Other | Admitting: Family Medicine

## 2017-10-23 ENCOUNTER — Encounter: Payer: Self-pay | Admitting: Family Medicine

## 2017-10-23 VITALS — BP 98/54 | HR 60 | Temp 97.4°F | Resp 14

## 2017-10-23 DIAGNOSIS — L600 Ingrowing nail: Secondary | ICD-10-CM

## 2017-10-23 DIAGNOSIS — E039 Hypothyroidism, unspecified: Secondary | ICD-10-CM | POA: Diagnosis not present

## 2017-10-23 DIAGNOSIS — G309 Alzheimer's disease, unspecified: Secondary | ICD-10-CM | POA: Diagnosis not present

## 2017-10-23 DIAGNOSIS — R627 Adult failure to thrive: Secondary | ICD-10-CM | POA: Diagnosis not present

## 2017-10-23 DIAGNOSIS — Z23 Encounter for immunization: Secondary | ICD-10-CM

## 2017-10-23 DIAGNOSIS — F0280 Dementia in other diseases classified elsewhere without behavioral disturbance: Secondary | ICD-10-CM | POA: Diagnosis not present

## 2017-10-23 DIAGNOSIS — J309 Allergic rhinitis, unspecified: Secondary | ICD-10-CM

## 2017-10-23 DIAGNOSIS — B351 Tinea unguium: Secondary | ICD-10-CM

## 2017-10-23 MED ORDER — CETIRIZINE HCL 10 MG PO TABS: 10.0000 mg | ORAL_TABLET | Freq: Every day | ORAL | 11 refills | Status: DC

## 2017-10-23 MED ORDER — DONEPEZIL HCL 10 MG PO TABS: 10.0000 mg | ORAL_TABLET | Freq: Every day | ORAL | 11 refills | Status: DC

## 2017-10-23 MED ORDER — MOMETASONE FUROATE 50 MCG/ACT NA SUSP: 2.0000 | Freq: Every day | NASAL | 12 refills | Status: DC

## 2017-10-23 NOTE — Progress Notes (Signed)
Subjective:    Patient ID: Kristi Webb, female    DOB: Feb 16, 1932, 84 y.o.   MRN: [id number]  HPI Ms. Saja Bartolini, a 84 year old female with a history of dementia, thyroid disease, and failure to thrive presents accompanied by daughter and caregiver to establish care. She was previously a patient of Levonne Lapping, NP at [facility], but has been lost to follow up over the past several months.  Patient resides at home with her daughter [name] and has been under her care over the past 5 years.  Patient was diagnosed with dementia in 2012 and it has been worsening since that time.  Patient is currently nonverbal and requires total care.  The patient's family and friends have been providing round-the-clock care to ensure that patient is cared for properly.  Patient is bedbound, however she is able to transfer from bed to wheelchair with total assistance.  Ms. Lucretia Roers is here for evaluation and further treatment of cognitive issues.  Patient currently takes Aricept and Namenda consistently.  Patient's daughter states that she sleeps throughout the night and typically watches television during the day.  The patient's family is concerned about weight loss and decreased appetite.  Her daughter states that she typically takes a few bites of food and a few sips of drink, which is decreased from 1 month ago.  Her family is also concerned that dementia may be worsening.   Medication administration: family monitors medication usage and caregiver monitors the use of medications.  Patient is not followed by palliative care or hospice at this point. Functional Assessment:  Activities of Daily Living (ADLs):   She is totally dependent on caregivers in the following: bathing and hygiene, feeding, continence and grooming   Patient's family states that toenails are thickened and discolored.  Caregiver has been unable to clip toenails with a standard clipper due to overall thickness.  Also, suspect that patient may have  an ingrown toenail.  Patient is unable to express whether toenails are painful.  Family no longer applies shoes.  Patient primarily wears socks.  This would also has a history of hypothyroidism.  Patient was on medication for this problem years ago.  Daughter states that thyroid levels have not been checked over the last several years.   Ms. Hegg also has a history of allergic rhinitis.  Her daughter states that she has periodic postnasal drip.  She has been taking Claritin consistently without relief.  She has had this problem over the last several months.  Patient has environmental allergies.   Past Medical History:  Diagnosis Date  . Dementia    worse since 2012  . FTT (failure to thrive) in adult   . 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 SDH (subdural hematoma) (HCC)    b/l 11/2011 at [facility] placed on keppra 250 mg bid   . Thrush   . Thyroid disease    Social History   Socioeconomic History  . Marital status: Divorced    Spouse name: N/A  . Number of children: 3  . Years of education: HS grad  . Highest education level: Not on file  Social Needs  . Financial resource strain: Not on file  . Food insecurity - worry: Not on file  . Food insecurity - inability: Not on file  . Transportation needs - medical: Not on file  . Transportation needs - non-medical: Not on file  Occupational History  . 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 Smoking status: Former Smoker    Packs/day: 1.00    Years: 40.00    Pack years: 40.00    Types: Cigarettes  . Smokeless tobacco: Never Used  Substance and Sexual Activity  . Alcohol use: No  . Drug use: No  . Sexual activity: No  Other Topics Concern  . Not on file  Social History Narrative   Ms. Joseph ArtWoods' daughter Chip BoerVicki recently took over custody of her mother in court. As of last Saturday (05/24/12) Ms. Joseph ArtWoods is living with this daughter. The patient was previously living with a female roommate for 10-12 years who did  not allow the family to see the patient for the last 2 years.  Widowed, 4 children, 2 boys 2 girls.  Several grandchildren and several great grandchildren.  High school graduate worked in the [employer]          Immunization History  Administered Date(s) Administered  . Pneumococcal Polysaccharide-23 06/01/2012                   .  Past Medical History:  Diagnosis Date  . Dementia    worse since 2012  . FTT (failure to  thrive) in adult   . 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 SDH (subdural hematoma) (HCC)    b/l 11/2011 at [facility] placed on keppra 250 mg bid   . Thrush   . Thyroid disease    Social History   Socioeconomic History  . Marital status: Divorced    Spouse name: N/A  . Number of children: 3  . Years of education: HS grad  . Highest education level: Not on file  Social Needs  . Financial resource strain: Not on file  . Food insecurity - worry: Not on file  . Food insecurity - inability: Not on file  . Transportation needs - medical: Not on file  . Transportation needs - non-medical: Not on file  Occupational History  . 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 Smoking status: Former Smoker    Packs/day: 1.00    Years: 40.00    Pack years: 40.00    Types: Cigarettes  . Smokeless tobacco: Never Used  Substance and Sexual Activity  . Alcohol use: No  . Drug use: No  . Sexual activity: No  Other Topics Concern  . Not on file  Social History Narrative   Ms. Dykes' daughter [name] recently took over custody of her mother in court. As of last Saturday (05/24/12) Ms. Ramo is living with this daughter. The patient was previously living with a female roommate for 10-12 years who did not allow the family to see the patient for the last 2 years.  Widowed, 4 children, 2 boys 2 girls.  Several grandchildren and several great grandchildren.  High school graduate worked in the [employer] History  Administered Date(s) Administered  . Pneumococcal Polysaccharide-23 06/01/2012    Review of Systems  Constitutional: Positive for unexpected weight change (Weight loss). Negative for fatigue and fever.  HENT: Positive for postnasal drip.   Respiratory: Negative.   Cardiovascular: Negative.   Gastrointestinal: Negative.   Genitourinary:       Incontinence of urine and stool  Skin:       Toenail discoloration and thickening   Allergic/Immunologic: Positive for environmental allergies.  Neurological: Negative.   Hematological: Negative.   Psychiatric/Behavioral: Positive for confusion.       Objective:   Physical Exam  HENT:  Head: Normocephalic and atraumatic.  Right Ear: External ear normal.  Left Ear: External ear normal.  Nose: Nose normal.  Mouth/Throat: Oropharynx is clear and moist.  Eyes: Pupils are equal, round, and reactive to light.  Neck: Normal range of motion.  Cardiovascular: Normal rate, regular rhythm, normal heart sounds and intact distal pulses.  Pulmonary/Chest: Effort normal and breath sounds normal.  Abdominal: Soft. Bowel sounds are normal.  Neurological: She has normal reflexes. She is unresponsive.  Patient is nonverbal She does not follow commands Patient smiles periodically and follows voices with her eyes  Skin: Skin is warm and dry.  Right great toe, thickening and yellow  Right 2nd toe, thickening, hyperpigmented.       BP (!) 98/54 (BP Location: Right Arm, Patient Position: Sitting, Cuff Size: Normal)   Pulse 60   Temp (!) 97.4 F (36.3 C) (Axillary)   Resp 14     Assessment & Plan:   Alzheimer's dementia without behavioral disturbance, unspecified timing of dementia onset  Patient appears to have end-stage Alzheimer's dementia.  Requesting records from previous provider for official diagnosis.  Patient and family may benefit from palliative care services. We will continue Aricept and Namenda as previously prescribed - donepezil (ARICEPT) 10 MG tablet; Take 1 tablet (10 mg total) by mouth at bedtime.  Dispense: 30 tablet; Refill: 11 - Amb Referral to Palliative Care   Immunization due - Pneumococcal conjugate vaccine 13-valent  Hypothyroidism, unspecified type - Thyroid Panel With TSH  Onychomycosis We will send an ambulatory referral to podiatry for toenail thickening and possible ingrown toenail.  Recommend that family refrain from attempting to clip  toenails. - Ambulatory referral to Podiatry  Ingrown toenail - Ambulatory referral to Podiatry  Allergic rhinitis, unspecified seasonality, unspecified trigger - mometasone (NASONEX) 50 MCG/ACT nasal spray; Place 2 sprays into the nose daily.  Dispense: 17 g; Refill: 12 - cetirizine (ZYRTEC) 10 MG tablet; Take 1 tablet (10 mg total) by mouth daily.  Dispense: 30 tablet; Refill: 11   Failure to thrive in adult I suspect that failure to thrive is related to worsening dementia.  Recommend that family follow aspiration precautions ensure that patient is sitting upright and only feed small amounts when the patient is alert.  Again.  Patient may benefit from palliative care services. - Comprehensive metabolic panel - CBC with Differential  Influenza vaccination given - Flu Vaccine QUAD 36+ mos IM (Fluarix & Fluzone Quad PF    RTC: Will review medical records as they become available.  Follow-up in office in 3 months for chronic conditions.  Will discuss any abnormal laboratory findings by phone.  Greater than 50% of visit spent discussing caregiver options and reviewing care plan with patient's family  Nolon Nations  MSN, FNP-C [facility] [address] [place], [place] [address] [phone]

## 2017-10-23 NOTE — Patient Instructions (Addendum)
Patient established care today accompanied by daughter Kristi Webb, primary caregiver. We will send a referral to palliative care in reference to ongoing Alzheimer's/dementia. We will send a referral to podiatry for onychomycosis for further workup and evaluation. For decreased circulation to lower extremities, elevate lower extremities to heart level while at rest. We will follow-up by phone with any abnormal laboratory values Discontinue Claritin, will start cetirizine 10 mg at bedtime for allergic rhinitis.  Also discontinue Flonase, will order Nasonex 1 spray to each nare twice daily. For dryness to ear canal use a Q-tip with minimal mineral oil to moisturize area

## 2017-10-24 ENCOUNTER — Telehealth: Payer: Self-pay

## 2017-10-24 LAB — CBC WITH DIFFERENTIAL/PLATELET
BASOS: 0 %
Basophils Absolute: 0 10*3/uL (ref 0.0–0.2)
EOS (ABSOLUTE): 0 10*3/uL (ref 0.0–0.4)
EOS: 1 %
HEMATOCRIT: 45.2 % (ref 34.0–46.6)
Hemoglobin: 14.5 g/dL (ref 11.1–15.9)
IMMATURE GRANS (ABS): 0 10*3/uL (ref 0.0–0.1)
IMMATURE GRANULOCYTES: 0 %
LYMPHS: 58 %
Lymphocytes Absolute: 2.8 10*3/uL (ref 0.7–3.1)
MCH: 28 pg (ref 26.6–33.0)
MCHC: 32.1 g/dL (ref 31.5–35.7)
MCV: 87 fL (ref 79–97)
MONOCYTES: 6 %
MONOS ABS: 0.3 10*3/uL (ref 0.1–0.9)
NEUTROS PCT: 35 %
Neutrophils Absolute: 1.7 10*3/uL (ref 1.4–7.0)
Platelets: 236 10*3/uL (ref 150–379)
RBC: 5.17 x10E6/uL (ref 3.77–5.28)
RDW: 15.4 % (ref 12.3–15.4)
WBC: 4.8 10*3/uL (ref 3.4–10.8)

## 2017-10-24 LAB — THYROID PANEL WITH TSH
FREE THYROXINE INDEX: 2.9 (ref 1.2–4.9)
T3 UPTAKE RATIO: 30 % (ref 24–39)
T4 TOTAL: 9.5 ug/dL (ref 4.5–12.0)
TSH: 3.52 u[IU]/mL (ref 0.450–4.500)

## 2017-10-24 LAB — COMPREHENSIVE METABOLIC PANEL
ALK PHOS: 77 IU/L (ref 39–117)
ALT: 10 IU/L (ref 0–32)
AST: 12 IU/L (ref 0–40)
Albumin/Globulin Ratio: 1.1 — ABNORMAL LOW (ref 1.2–2.2)
Albumin: 3.7 g/dL (ref 3.5–4.7)
BUN/Creatinine Ratio: 21 (ref 12–28)
BUN: 15 mg/dL (ref 8–27)
Bilirubin Total: 0.3 mg/dL (ref 0.0–1.2)
CALCIUM: 10 mg/dL (ref 8.7–10.3)
CO2: 23 mmol/L (ref 20–29)
CREATININE: 0.7 mg/dL (ref 0.57–1.00)
Chloride: 105 mmol/L (ref 96–106)
GFR calc Af Amer: 92 mL/min/{1.73_m2} (ref >59)
GFR, EST NON AFRICAN AMERICAN: 80 mL/min/{1.73_m2} (ref >59)
GLOBULIN, TOTAL: 3.3 g/dL (ref 1.5–4.5)
GLUCOSE: 81 mg/dL (ref 65–99)
Potassium: 4.4 mmol/L (ref 3.5–5.2)
Sodium: 145 mmol/L — ABNORMAL HIGH (ref 134–144)
Total Protein: 7 g/dL (ref 6.0–8.5)

## 2017-10-24 NOTE — Telephone Encounter (Signed)
-----   Message from Massie MaroonLachina M Hollis, [place] sent at 10/24/2017  6:13 AM EST ----- Regarding: lab results Please inform patient's daughter that labs are within normal limits. Will continue current medication regimen. Continue to increase protein sources. Also, recommend frequent turning to prevent skin compromise. Sent referral to podiatry for onychomycosis. Follow up in office as scheduled.   Thanks

## 2017-10-24 NOTE — Telephone Encounter (Signed)
Vickie Egle (patient's daughter) called back and I advised her of labs within normal limits and to increase protein sources. Asked to continue medication regiment as prescribed. Also recommend frequent turning to prevent skin compromise. Advised that we are sending in referral to podiatry for onychomycosis.

## 2017-10-24 NOTE — Telephone Encounter (Signed)
Called and left message for patient's daughter or caregiver to call back. Thanks!

## 2017-12-11 ENCOUNTER — Encounter: Payer: Self-pay | Admitting: Podiatry

## 2017-12-11 ENCOUNTER — Ambulatory Visit (INDEPENDENT_AMBULATORY_CARE_PROVIDER_SITE_OTHER): Payer: Medicare Other | Admitting: Podiatry

## 2017-12-11 VITALS — BP 106/70 | HR 74

## 2017-12-11 DIAGNOSIS — B351 Tinea unguium: Secondary | ICD-10-CM | POA: Diagnosis not present

## 2017-12-11 DIAGNOSIS — L600 Ingrowing nail: Secondary | ICD-10-CM

## 2017-12-11 NOTE — Progress Notes (Signed)
SUBJECTIVE: 84 y.o. year old female presents via wheel chair with her guardian for problematic toe nails. Nails are very thick and deformed. She is not able to care for her nails. Patient is wheel chair bound and not able to move her lower limbs. Discolored right great toe nail with old dry blood in nail bed.  Patient is none vocal. Patient is referred by Dr. Hart RochesterHollis.   Hip surgery 7-8 years ago, Brain surgery and also has dementia.  Review of Systems  Constitutional: Negative.   HENT: Negative.   Eyes: Negative.   Respiratory: Negative.   Cardiovascular: Negative.   Gastrointestinal: Negative.   Genitourinary: Negative.   Musculoskeletal:       Positive history of Hip surgery.  Skin: Negative.      OBJECTIVE: DERMATOLOGIC EXAMINATION: Thick dystrophic nails with fungal debris x 10. Ingrown hallucal nails on both feet. No acute skin infection noted.  VASCULAR EXAMINATION OF LOWER LIMBS: All pedal pulses are not palpable on both DP and PT bilateral.  Temperature gradient from tibial crest to dorsum of foot is within normal bilateral.  NEUROLOGIC EXAMINATION OF THE LOWER LIMBS: All epicritic and tactile sensations grossly intact. Sharp and Dull discriminatory sensations at the plantar ball of hallux is intact bilateral.   MUSCULOSKELETAL EXAMINATION: Positive for plantar flexed ankle joint with stiffness at ankle joint bilateral. Loss of normal ankle joint motion bilateral.  ASSESSMENT: Onychomycosis.  PLAN: Reviewed findings and available treatment options. All nails debrided. Return in 3 month or sooner if needed.

## 2017-12-11 NOTE — Patient Instructions (Signed)
Seen for hypertrophic nails. All nails debrided. Return in 3 months or as needed.  

## 2018-01-22 ENCOUNTER — Ambulatory Visit (INDEPENDENT_AMBULATORY_CARE_PROVIDER_SITE_OTHER): Payer: Medicare Other | Admitting: Family Medicine

## 2018-01-22 VITALS — BP 88/50 | HR 75 | Temp 98.4°F | Resp 14

## 2018-01-22 DIAGNOSIS — E876 Hypokalemia: Secondary | ICD-10-CM

## 2018-01-22 DIAGNOSIS — J309 Allergic rhinitis, unspecified: Secondary | ICD-10-CM

## 2018-01-22 DIAGNOSIS — F0280 Dementia in other diseases classified elsewhere without behavioral disturbance: Secondary | ICD-10-CM

## 2018-01-22 DIAGNOSIS — E039 Hypothyroidism, unspecified: Secondary | ICD-10-CM | POA: Diagnosis not present

## 2018-01-22 DIAGNOSIS — G309 Alzheimer's disease, unspecified: Secondary | ICD-10-CM | POA: Diagnosis not present

## 2018-01-22 DIAGNOSIS — R2241 Localized swelling, mass and lump, right lower limb: Secondary | ICD-10-CM | POA: Diagnosis not present

## 2018-01-22 MED ORDER — MEMANTINE HCL 5 MG PO TABS: 5.0000 mg | ORAL_TABLET | Freq: Every day | ORAL | 5 refills | Status: DC

## 2018-01-22 MED ORDER — DONEPEZIL HCL 10 MG PO TABS: 10.0000 mg | ORAL_TABLET | Freq: Every day | ORAL | 11 refills | Status: DC

## 2018-01-22 MED ORDER — MOMETASONE FUROATE 50 MCG/ACT NA SUSP: 2.0000 | Freq: Every day | NASAL | 12 refills | Status: DC

## 2018-01-22 NOTE — Progress Notes (Signed)
Subjective:    Patient ID: Kristi Webb, female    DOB: Jun 06, 1932, 84 y.o.   MRN: [id number]  HPI Kristi Webb, a 84 year old female with a history of dementia, thyroid disease, and failure to thrive presents accompanied by daughter and caregiver for follow up.  Patient resides at home with her daughter Kristi BoerVicki and has been under her care over the past 5 years.  Patient was diagnosed with dementia in 2012 and it has been worsening since that time.  Patient is currently nonverbal and requires total care.  The patient's family and friends have been providing around-the-clock care to ensure that patient is cared for properly.  Patient is bedbound, however she is able to transfer from bed to wheelchair with total assistance.  Kristi Webb is here for evaluation and further treatment of cognitive issues.  Patient currently takes Aricept and Namenda consistently.  Patient's daughter states that she sleeps throughout the night and typically watches television during the day.  The patient's family is concerned about weight loss and decreased appetite.  Her daughter states that she typically takes a few bites of food and a few sips of drink, which is decreased from 1 month ago.  Her family is also concerned that dementia may be worsening.   Medication administration: family monitors medication usage and caregiver monitors the use of medications.  Patient is not followed by palliative care or hospice at this point. Functional Assessment:  Activities of Daily Living (ADLs):   She is totally dependent on caregivers in the following: bathing and hygiene, feeding, continence and grooming   Patient's daughter reports periodic shaking and convulsions. Daughter called EMS several weeks ago. She was told by EMS that symptoms did not sound like seizure activity and to keep a close eye on mother.   This would also has a history of hypothyroidism.  Patient was on medication for this problem years ago.  Kristi Webb has been  therapeutic on current dosage.   Kristi Webb also has a history of allergic rhinitis.  Her daughter states that she has periodic postnasal drip.  She has been taking Claritin consistently without relief.  She has had this problem over the last several months.  Patient has environmental allergies.   Past Medical History:  Diagnosis Date  . Dementia    worse since 2012  . FTT (failure to thrive) in adult   . 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 SDH (subdural hematoma) (HCC)    [facility] placed on keppra 250 mg bid   . Thrush   . Thyroid disease    Social History   Socioeconomic History  . Marital status: Divorced    Spouse name: N/A  . Number of children: 3  . Years of education: HS grad  . Highest education level: Not on file  Occupational History  . 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 Financial resource strain: Not on file  . Food insecurity:    Worry: Not on file    Inability: Not on file  . Transportation needs:    Medical: Not on file    Non-medical: Not on file  Tobacco Use  . Smoking status: Former Smoker    Packs/day: 1.00    Years: 40.00    Pack years: 40.00    Types: Cigarettes  . Smokeless tobacco: Never Used  Substance and Sexual Activity  . Alcohol use: No  . Drug use: No  .  Sexual activity: Never  Lifestyle  . Physical activity:    Days per week: Not on file    Minutes per session: Not on file  . Stress: Not on file  Relationships  . Social connections:    Talks on phone: Not on file    Gets together: Not on file    Attends religious service: Not on file    Active member of club or organization: Not on file    Attends meetings of clubs or organizations: Not on file    Relationship status: Not on file  . Intimate partner violence:    Fear of current or ex partner: Not on file    Emotionally abused: Not on file    Physically abused: Not on file    Forced sexual activity: Not on file  Other Topics Concern   . Not on file  Social History Narrative   Kristi Webb recently took over custody of her mother in court. As of last Saturday (05/24/12) Kristi Webb is living with this daughter. The patient was previously living with a female roommate for 10-12 years who did not allow the family to see the patient for the last 2 years.  Widowed, 4 children, 2 boys 2 girls.  Several grandchildren and several great grandchildren.  High school graduate worked in the [employer] History  Administered Date(s) Administered  . Influenza,inj,Quad PF,6+ Mos 10/23/2017  . Pneumococcal Conjugate-13 10/23/2017  . Pneumococcal Polysaccharide-23 06/01/2012                   .  Past Medical History:  Diagnosis Date  . Dementia    worse since 2012  . FTT (failure to thrive) in adult   . 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 SDH (subdural  hematoma) (HCC)    b/l 11/2011 at [facility] placed on keppra 250 mg bid   . Thrush   . Thyroid disease    Social History   Socioeconomic History  . Marital status: Divorced    Spouse name: N/A  . Number of children: 3  . Years of education: HS grad  . Highest education level: Not on file  Occupational History  . 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 Financial resource strain: Not on file  . Food insecurity:    Worry: Not on file    Inability: Not on file  . Transportation needs:    Medical: Not on file    Non-medical: Not on file  Tobacco Use  . Smoking status: Former Smoker    Packs/day: 1.00    Years: 40.00    Pack years: 40.00    Types: Cigarettes  . Smokeless tobacco: Never Used  Substance and Sexual Activity  . Alcohol use: No  . Drug use: No  . Sexual activity: Never  Lifestyle  . Physical activity:    Days per week: Not on file    Minutes per session: Not on file  . Stress: Not on file  Relationships  . Social connections:    Talks on phone: Not on file    Gets together: Not on file    Attends religious service: Not on file    Active member of club or organization: Not on file    Attends meetings of clubs or organizations: Not on file    Relationship status: Not on file  . Intimate partner violence:    Fear of current or ex partner: Not on file    Emotionally abused: Not on file    Physically abused: Not on file    Forced sexual activity: Not on file  Other Topics Concern  . Not on file  Social History Narrative   Kristi Webb recently took over custody of her mother in court. As of last Saturday (05/24/12) Kristi Webb is living with this daughter. The patient was previously living with a female roommate for 10-12 years who did not allow the family to see the patient for the last 2 years.  Widowed, 4 children, 2 boys 2 girls.  Several grandchildren and several great grandchildren.  High school graduate worked in the  [employer] History  Administered Date(s) Administered  . Influenza,inj,Quad PF,6+ Mos 10/23/2017  . Pneumococcal Conjugate-13 10/23/2017  . Pneumococcal Polysaccharide-23 06/01/2012    Review of Systems  Constitutional: Positive for unexpected weight change (Weight loss). Negative for fatigue and fever.  HENT: Positive for postnasal drip.   Respiratory: Negative.   Cardiovascular: Negative.   Gastrointestinal: Negative.   Genitourinary:       Incontinence of urine and stool  Skin:       Toenail discoloration and thickening  Allergic/Immunologic: Positive for environmental allergies.  Neurological: Positive for speech difficulty (non verbal) and weakness (Requires total assistance from famiy).  Hematological: Negative.   Psychiatric/Behavioral: Positive for confusion.       Objective:   Physical Exam  HENT:  Head: Normocephalic and atraumatic.  Right Ear: External ear normal.  Left Ear: External ear  normal.  Nose: Nose normal.  Mouth/Throat: Oropharynx is clear and moist.  Eyes: Pupils are equal, round, and reactive to light.  Neck: Normal range of motion.  Cardiovascular: Normal rate, regular rhythm, normal heart sounds and intact distal pulses.  Pulmonary/Chest: Effort normal and breath sounds normal.  Abdominal: Soft. Bowel sounds are normal.  Neurological: She has normal reflexes. She is unresponsive.  Patient is nonverbal She does not follow commands Patient smiles periodically and follows voices with her eyes  Skin: Skin is warm and dry.      BP (!) 88/50 (BP Location: Right Arm, Patient Position: Sitting, Cuff Size: Large) Comment: manually  Pulse 75   Temp 98.4 F (36.9 C) (Oral)   Resp 14     Assessment & Plan:   1. Localized swelling, mass, or lump of lower extremity, right - VAS [place] LOWER EXTREMITY VENOUS (DVT); Future  2. Hypokalemia - Basic Metabolic Panel  3. Allergic rhinitis, unspecified seasonality,  unspecified trigger - mometasone (NASONEX) 50 MCG/ACT nasal spray; Place 2 sprays into the nose daily.  Dispense: 17 g; Refill: 12  4. Alzheimer's dementia without behavioral disturbance, unspecified timing of dementia onset - memantine (NAMENDA) 5 MG tablet; Take 1 tablet (5 mg total) by mouth daily.  Dispense: 60 tablet; Refill: 5 - donepezil (ARICEPT) 10 MG tablet; Take 1 tablet (10 mg total) by mouth at bedtime.  Dispense: 30 tablet; Refill: 11  5. Acquired hypothyroidism - Thyroid Panel With TSH  RTC: 3 months for chronic conditions  Nolon Nations  MSN, FNP-C Patient Care [facility] [address] [place], [place] [address] [phone]

## 2018-01-22 NOTE — Patient Instructions (Signed)
Will schedule doppler to rule out clot in right lower extremity  Will send a referral to neurology for further workup and evaluation of neurological symptoms  Will follow up by phone with any abnormal labs.   Thanks for coming in.

## 2018-01-23 ENCOUNTER — Telehealth: Payer: Self-pay

## 2018-01-23 ENCOUNTER — Other Ambulatory Visit: Payer: Self-pay | Admitting: Family Medicine

## 2018-01-23 ENCOUNTER — Ambulatory Visit (HOSPITAL_COMMUNITY): Payer: Medicare Other

## 2018-01-23 DIAGNOSIS — E039 Hypothyroidism, unspecified: Secondary | ICD-10-CM

## 2018-01-23 LAB — BASIC METABOLIC PANEL
BUN / CREAT RATIO: 19 (ref 12–28)
BUN: 14 mg/dL (ref 8–27)
CHLORIDE: 105 mmol/L (ref 96–106)
CO2: 23 mmol/L (ref 20–29)
Calcium: 9.9 mg/dL (ref 8.7–10.3)
Creatinine, Ser: 0.75 mg/dL (ref 0.57–1.00)
GFR calc Af Amer: 85 mL/min/{1.73_m2} (ref >59)
GFR calc non Af Amer: 73 mL/min/{1.73_m2} (ref >59)
GLUCOSE: 75 mg/dL (ref 65–99)
Potassium: 4.1 mmol/L (ref 3.5–5.2)
SODIUM: 143 mmol/L (ref 134–144)

## 2018-01-23 LAB — THYROID PANEL WITH TSH
Free Thyroxine Index: 2.5 (ref 1.2–4.9)
T3 Uptake Ratio: 31 % (ref 24–39)
T4 TOTAL: 8.2 ug/dL (ref 4.5–12.0)
TSH: 6.18 u[IU]/mL — AB (ref 0.450–4.500)

## 2018-01-23 MED ORDER — LEVOTHYROXINE SODIUM 75 MCG PO TABS: 75.0000 ug | ORAL_TABLET | Freq: Every day | ORAL | 11 refills | Status: DC

## 2018-01-23 NOTE — Telephone Encounter (Signed)
Patient's daughter(care giver) called back and I advised of tsh being elevated and the need to increase levothyroxine to daily and that we will need to recheck in 6 weeks. Caregiver verbalized understanding and appointment was scheduled. Thanks!

## 2018-01-23 NOTE — Progress Notes (Signed)
Meds ordered this encounter  Medications  . levothyroxine (SYNTHROID) 75 MCG tablet    Sig: Take 1 tablet (75 mcg total) by mouth daily.    Dispense:  30 tablet    Refill:  11    Nolon NationsLachina Moore Daxx Tiggs  MSN, FNP-C [facility] [address]  [place], [place] [address] [phone]

## 2018-01-23 NOTE — Telephone Encounter (Signed)
Called no answer. Left a message for caregiver to call back. Thanks!

## 2018-01-23 NOTE — Telephone Encounter (Signed)
-----   Message from Massie MaroonLachina M Hollis, [place] sent at 01/23/2018 12:16 PM EDT ----- Regarding: lab results Please inform patient that TSH was elevated. Will increase levothyroxine to 75 mcg daily.   Please schedule lab appointment in 6 weeks.   Nolon NationsLachina Moore Hollis  MSN, FNP-C Patient Care [facility] [address]  [place], [place] [address] [phone]

## 2018-01-23 NOTE — Progress Notes (Signed)
Orders Placed This Encounter  Procedures  . TSH    Standing Status:   Future    Standing Expiration Date:   01/24/2019     Nolon NationsLachina Moore Yvonne Stopher  MSN, FNP-C [facility] [address]  [place], [place] [address] [phone]

## 2018-01-28 ENCOUNTER — Encounter: Payer: Self-pay | Admitting: Family Medicine

## 2018-01-29 ENCOUNTER — Ambulatory Visit (HOSPITAL_COMMUNITY)
Admission: RE | Admit: 2018-01-29 | Discharge: 2018-01-29 | Disposition: A | Discharge status: Home / Self Care | Payer: Medicare Other | Source: Ambulatory Visit | Attending: Family Medicine | Admitting: Family Medicine

## 2018-01-29 DIAGNOSIS — M7989 Other specified soft tissue disorders: Secondary | ICD-10-CM | POA: Insufficient documentation

## 2018-01-29 DIAGNOSIS — R2241 Localized swelling, mass and lump, right lower limb: Secondary | ICD-10-CM | POA: Diagnosis not present

## 2018-01-29 NOTE — Progress Notes (Signed)
Right lower extremity venous duplex has been completed. Negative for DVT. Results were given to Julianne Handler FNP.  01/29/18 1:47 PM Olen Cordial RVT

## 2018-02-13 ENCOUNTER — Encounter: Payer: Self-pay | Admitting: Family Medicine

## 2018-02-17 ENCOUNTER — Telehealth: Payer: Self-pay

## 2018-02-17 NOTE — Telephone Encounter (Signed)
CALLED, NO ANSWER LEFT A MESSAGE FOR PATIENT TO CALL BACK. THANKS!  

## 2018-03-05 ENCOUNTER — Other Ambulatory Visit: Payer: Medicare Other

## 2018-03-05 DIAGNOSIS — E039 Hypothyroidism, unspecified: Secondary | ICD-10-CM

## 2018-03-06 LAB — TSH: TSH: 4.1 u[IU]/mL (ref 0.450–4.500)

## 2018-03-17 ENCOUNTER — Other Ambulatory Visit: Payer: Self-pay | Admitting: Family Medicine

## 2018-03-19 ENCOUNTER — Ambulatory Visit: Payer: Medicare Other | Admitting: Podiatry

## 2018-04-16 ENCOUNTER — Encounter

## 2018-04-16 ENCOUNTER — Ambulatory Visit (INDEPENDENT_AMBULATORY_CARE_PROVIDER_SITE_OTHER): Payer: Medicare Other | Admitting: Neurology

## 2018-04-16 ENCOUNTER — Encounter: Payer: Self-pay | Admitting: Neurology

## 2018-04-16 VITALS — BP 122/68 | HR 68 | Ht 67.0 in

## 2018-04-16 DIAGNOSIS — G40209 Localization-related (focal) (partial) symptomatic epilepsy and epileptic syndromes with complex partial seizures, not intractable, without status epilepticus: Secondary | ICD-10-CM

## 2018-04-16 DIAGNOSIS — F0390 Unspecified dementia without behavioral disturbance: Secondary | ICD-10-CM

## 2018-04-16 DIAGNOSIS — G40109 Localization-related (focal) (partial) symptomatic epilepsy and epileptic syndromes with simple partial seizures, not intractable, without status epilepticus: Secondary | ICD-10-CM | POA: Diagnosis not present

## 2018-04-16 DIAGNOSIS — G40201 Localization-related (focal) (partial) symptomatic epilepsy and epileptic syndromes with complex partial seizures, not intractable, with status epilepticus: Secondary | ICD-10-CM | POA: Diagnosis not present

## 2018-04-16 MED ORDER — DIVALPROEX SODIUM ER 250 MG PO TB24
250.0000 mg | ORAL_TABLET | Freq: Every day | ORAL | 11 refills | Status: DC
Start: 2018-04-16 — End: 2018-04-17

## 2018-04-16 NOTE — Patient Instructions (Addendum)
Start Depakote 250mg  at bedtime   Valproic Acid, Divalproex Sodium delayed or extended-release tablets What is this medicine? DIVALPROEX SODIUM (dye VAL pro ex SO dee um) is used to prevent seizures caused by some forms of epilepsy. It is also used to treat bipolar mania and to prevent migraine headaches. This medicine may be used for other purposes; ask your health care provider or pharmacist if you have questions. COMMON BRAND NAME(S): Depakote, Depakote ER What should I tell my health care provider before I take this medicine? They need to know if you have any of these conditions: -if you often drink alcohol -kidney disease -liver disease -low platelet counts -mitochondrial disease -suicidal thoughts, plans, or attempt; a previous suicide attempt by you or a family member -urea cycle disorder (UCD) -an unusual or allergic reaction to divalproex sodium, sodium valproate, valproic acid, other medicines, foods, dyes, or preservatives -pregnant or trying to get pregnant -breast-feeding How should I use this medicine? Take this medicine by mouth with a drink of water. Follow the directions on the prescription label. Do not cut, crush or chew this medicine. You can take it with or without food. If it upsets your stomach, take it with food. Take your medicine at regular intervals. Do not take it more often than directed. Do not stop taking except on your doctor's advice. A special MedGuide will be given to you by the pharmacist with each prescription and refill. Be sure to read this information carefully each time. Talk to your pediatrician regarding the use of this medicine in children. While this drug may be prescribed for children as young as 10 years for selected conditions, precautions do apply. Overdosage: If you think you have taken too much of this medicine contact a poison control center or emergency room at once. NOTE: This medicine is only for you. Do not share this medicine with  others. What if I miss a dose? If you miss a dose, take it as soon as you can. If it is almost time for your next dose, take only that dose. Do not take double or extra doses. What may interact with this medicine? Do not take this medicine with any of the following medications: -sodium phenylbutyrate This medicine may also interact with the following medications: -aspirin -certain antibiotics like ertapenem, imipenem, meropenem -certain medicines for depression, anxiety, or psychotic disturbances -certain medicines for seizures like carbamazepine, clonazepam, diazepam, ethosuximide, felbamate, lamotrigine, phenobarbital, phenytoin, primidone, rufinamide, topiramate -certain medicines that treat or prevent blood clots like warfarin -cholestyramine -female hormones, like estrogens and birth control pills, patches, or rings -propofol -rifampin -ritonavir -tolbutamide -zidovudine This list may not describe all possible interactions. Give your health care provider a list of all the medicines, herbs, non-prescription drugs, or dietary supplements you use. Also tell them if you smoke, drink alcohol, or use illegal drugs. Some items may interact with your medicine. What should I watch for while using this medicine? Tell your doctor or healthcare professional if your symptoms do not get better or they start to get worse. Wear a medical ID bracelet or chain, and carry a card that describes your disease and details of your medicine and dosage times. You may get drowsy, dizzy, or have blurred vision. Do not drive, use machinery, or do anything that needs mental alertness until you know how this medicine affects you. To reduce dizzy or fainting spells, do not sit or stand up quickly, especially if you are an older patient. Alcohol can increase drowsiness and dizziness. Avoid  alcoholic drinks. This medicine can make you more sensitive to the sun. Keep out of the sun. If you cannot avoid being in the sun,  wear protective clothing and use sunscreen. Do not use sun lamps or tanning beds/booths. Patients and their families should watch out for new or worsening depression or thoughts of suicide. Also watch out for sudden changes in feelings such as feeling anxious, agitated, panicky, irritable, hostile, aggressive, impulsive, severely restless, overly excited and hyperactive, or not being able to sleep. If this happens, especially at the beginning of treatment or after a change in dose, call your health care professional. Women should inform their doctor if they wish to become pregnant or think they might be pregnant. There is a potential for serious side effects to an unborn child. Talk to your health care professional or pharmacist for more information. Women who become pregnant while using this medicine may enroll in the [place] American Antiepileptic Drug Pregnancy Registry by calling [phone]. This registry collects information about the safety of antiepileptic drug use during pregnancy. What side effects may I notice from receiving this medicine? Side effects that you should report to your doctor or health care professional as soon as possible: -allergic reactions like skin rash, itching or hives, swelling of the face, lips, or tongue -changes in vision -redness, blistering, peeling or loosening of the skin, including inside the mouth -signs and symptoms of liver injury like dark yellow or brown urine; general ill feeling or flu-like symptoms; light-colored stools; loss of appetite; nausea; right upper belly pain; unusually weak or tired; yellowing of the eyes or skin -suicidal thoughts or other mood changes -unusual bleeding or bruising Side effects that usually do not require medical attention (report to your doctor or health care professional if they continue or are bothersome): -constipation -diarrhea -dizziness -hair loss -headache -loss of appetite -weight gain This list may not  describe all possible side effects. Call your doctor for medical advice about side effects. You may report side effects to FDA at 1-800-FDA-1088. Where should I keep my medicine? Keep out of reach of children. Store at room temperature between 15 and 30 degrees C (59 and 86 degrees F). Keep container tightly closed. Throw away any unused medicine after the expiration date. NOTE: This sheet is a summary. It may not cover all possible information. If you have questions about this medicine, talk to your doctor, pharmacist, or health care provider.  2018 Elsevier/Gold Standard (2015-12-22 07:11:40)  Recommendations to prevent or slow progression of cognitive decline:   Exercise You should increase exercise 30 to 45 minutes per day at least 3 days a week although 5 to 7 would be preferred. Any type of exercise (including walking) is acceptable although a recumbent bicycle may be best if you are unsteady. Disease related apathy can be a significant roadblock to exercise and the only way to overcome this is to make it a daily routine and perhaps have a reward at the end (something your loved one loves to eat or drink perhaps) or a personal trainer coming to the home can also be very useful. In general a structured, repetitive schedule is best.   Cardiovascular Health: You should optimize all cardiovascular risk factors (blood pressure, sugar, cholesterol) as vascular disease such as strokes and heart attacks can make memory problems much worse.   Diet: Eating a heart healthy (Mediterranean) diet is also a good idea; fish and poultry instead of red meat, nuts (mostly non-peanuts), vegetables, fruits, olive oil or canola  oil (instead of butter), minimal salt (use other spices to flavor foods), whole grain rice, bread, cereal and pasta and wine in moderation.  General Health: Any diseases which effect your body will effect your brain such as a pneumonia, urinary infection, blood clot, heart attack or stroke.  Keep contact with your primary care doctor for regular follow ups.  Sleep. A good nights sleep is healthy for the brain. Seven hours is recommended. If you have insomnia or poor sleep habits see the recommendations below  Tips: Structured and consistent daytime and nighttime routine, including regular wake times, bedtimes, and mealtimes, will be important for the patient to avoid confusion. Keeping frequently used items in designated places will help reduce stress from searching. If there are worries about getting lost do not let the patient leave home unaccompanied. They might benefit from wearing an identification bracelet that will help others assist in finding home if they become lost. Information about nationwide safe return services and other helpful resources may be obtained through the Alzheimer's Association helpline at [phone].  Finances, Power of Restaurant manager, fast food Directives: You should consider putting legal safeguards in place with regard to financial and medical decision making. While the spouse always has power of attorney for medical and financial issues in the absence of any form, you should consider what you want in case the spouse / caregiver is no longer around or capable of making decisions.   [place] : MovieEvening.com.au.pdf  Or Google "[facility]" AND "An Proofreader for [employer]  Other States: PainBasics.com.au  The signature on these forms should be notarized.        RESOURCES:  Memory Loss: Improve your short term memory By Laverle Patter  The Alzheimer's Reading Room http://www.alzheimersreadingroom.com/   The Alzheimer's Compendium http://www.alzcompend.info/  [employer] www.dukefamilysupport.WUJ 5136555679  Recommended resources for caregivers (All can be purchased on  [employer]):  1) A Caregiver's Guide to Dementia: Using Activities and Other Strategies to Prevent, Reduce and Manage Behavioral Symptoms by Mahala Menghini. Bethena Midget and [employer]   2) A Caregiver's Guide to Owens & Minor Dementia by Briant Sites MS BSN and Velia Meyer   3) What If It's Not Alzheimer's?: A Caregiver's Guide to Dementia by Neila Gear (Author), Roberts Gaudy (Editor)  3) The 36 hour day by Rabins and Mace  4) Understanding Difficult Behaviors by Gerre Scull and White  5) Memory Cafe support group in [place]  Online course for helping caregivers reduce stress, guilt and frustration called the Caregivers Helpbook. The website is www.powerfultoolsforcaregivers.org  As a caregiver you are a Government social research officer. Problems you face as a caregiver are usually unique to your situation and the way your loved-one's disease manifests itself. The best way to use these books is to look at the Table of Contents and read any chapters of interest or that apply to challenges you are having as a caregiver.  NATIONAL RESOURCES: For more information on neurological disorders or research programs funded by the [employer] of Neurological Disorders and Stroke, contact the Institute's Gaffer (BRAIN) at: BRAIN [address] [place] [address] [phone] (toll-free) ToledoAutomobile.co.uk  Information on dementia is also available from the following organizations: Alzheimer's Disease Education and Referral (ADEAR) Center [employer] on Aging [address] [place] [address] [phone] (toll-free) CashCowGambling.be  Alzheimer's Association [address] [place], [place] [address] [phone] (toll-free, 24-hour helpline) [phone] (TDD) LimitLaws.hu  Alzheimer's Foundation of [place] [address]  [place], [place] [id number] [phone] (toll-free) www.alzfdn.org   Alzheimer's Drug Discovery Foundation [address], [address] [place], [place] [id number] [phone] www.alzdiscovery.org  Association for Frontotemporal Degeneration [address] [place], [place] [id number] [phone] (toll-free) www.theaftd.[facility]  [employer]right[employer] Foundation [address] [place], [place] [id number] [phone] (toll-free) www.brightfocus.org/alzheimers  Earl GalaJohn Douglas French Alzheimer's Foundation [address] [place], [place] [id number] [phone] www.BushWebsites.nljdfaf.org  Lewy Body Dementia Association [address], [place], [place] [address] [phone] (toll-free LBD Caregiver Link) www.lbda.[facility]  National Institute of Mental Health [address] [place], [place] [phone] (toll-free) [phone] [facility](TTY) http://www.maynard.net/www.nimh.nih.gov  National Organization for Rare Disorders [address] [place], [place] [id number] [phone]-[phone]) (toll-free) www.rarediseases.org  The Dementias: Hope Through Research was jointly produced by the [employer] Institute of Neurological Disorders and Stroke (NINDS) and the [employer] Institute on Aging (NIA), both part of the [employer] Institutes of Health, the H. J. Heinznation's medical research agency-supporting scientific studies that turn discovery into health. NINDS is the nation's leading funder of research on the brain and nervous system. The NINDS mission is to reduce the burden of neurological disease. For more information and resources, visit ToledoAutomobile.co.ukwww.ninds.nih.gov [1] or call [phone]. NIA leads the federal government effort conducting and supporting research on aging and the health and well-being of older people. NIA's Alzheimer's Disease Education and Referral (ADEAR) Center offers information and publications on dementia and caregiving for families, caregivers, and professionals. For more information, visit  CashCowGambling.bewww.nia.nih.gov/alzheimers [2] or call [phone]. Also available from NIA are publications and information about Alzheimer's disease as well as the booklets Frontotemporal Disorders: Information for Patients, Families, and Caregivers and Lewy Body Dementia: Information for Patients, Families, and Professionals. Source URL: VoiceZap.ishttp://www.nia.nih.gov/alzheimers/publication/dementias/resources

## 2018-04-16 NOTE — Progress Notes (Signed)
[facility]    Provider:  Dr Lucia Gaskins Referring Provider: Massie Maroon, FNP Primary Care Physician:  Massie Maroon, FNP  CC:  Dementia and alteration of awareness  HPI:  Kristi Webb is a 84 y.o. female here as a referral from Dr. Hart Rochester for dementia for 6 years. PMHx dementia, FTT, SDH, thyroid disease. She has had dementia for 6 years diagnosed in 2012.  Daughter provides most information. She has been caring for her for 5 years. No behavioral issues, she is pleasant, appears happy. Has been non verbal for at least 5 years. Needs assistance 24 hours a day, can feed herself, she is total care otherwise. She is having episodes of "going out", she will stop eating and eyes will rollout and not responsive for about 15 minutes. She can be aroused by pressing on her nails. Paramedics have gone to the house. Started once every 6 months but became more frequent. She stares. After these episodes she is tired and then she naps. No movements in body like she is having a seizure. Last episode was a month ago. No concern for appetite she eats well. No urination or tongue biting.  Reviewed notes, labs and imaging from outside physicians, which showed:   Reviewed physician's notes, she has a history of dementia, thyroid disease and failure to thrive.  She resides at home with her daughter and is been under her care for over 5 years.  She was diagnosed with dementia in 2012 and it has worsened since that time.  She is currently nonverbal and requires total care.  The patient's family and friends have been providing around-the-clock care to ensure that patient is cared for properly.  Patient is bedbound however she is able to transfer from bed to wheelchair with total assistance.  Patient currently takes Aricept and Namenda.  She sleeps through the night and typically watches television during the day.  The patient's family is concerned about weight loss and decreased appetite.  Her  daughter states that she typically takes a few bites of food and a few sips of a drink it is decreased from 1 month ago.  Family is also concerned that dementia may be worsening.  CT head 11/2014: FINDINGS: personally reviewed images and agree Similar pattern of diffuse brain atrophy and chronic white matter microvascular ischemic changes, most pronounced in the frontal lobes. Stable ventricular enlargement with prominent temporal horns as before. Chronic appearing heterogeneous small bifrontal subdural collections remain evident. No new acute intracranial hemorrhage, midline shift, or herniation. Postop changes from bilateral craniotomies. Cisterns are patent. Cerebellar atrophy as well. Postoperative dural thickening on the right. The mastoids and sinuses remain clear.  IMPRESSION: Remote bilateral craniotomies.  Residual chronic bifrontal subdural collections.  No acute intracranial hemorrhage  Stable brain atrophy and chronic white matter microvascular change.  Stable ventricular enlargement, normal pressure hydrocephalus is not excluded.  Review of Systems: Patient complains of symptoms per HPI as well as the following symptoms: dementia, alteration of awareness. Pertinent negatives and positives per HPI. All others negative.   Social History   Socioeconomic History  . Marital status: Divorced    Spouse name: N/A  . Number of children: 3  . Years of education: HS grad  . Highest education level: Not on file  Occupational History  . 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 Financial resource strain: Not on file  . Food insecurity:    Worry: Not on file  Inability: Not on file  . Transportation needs:    Medical: Not on file    Non-medical: Not on file  Tobacco Use  . Smoking status: Former Smoker    Packs/day: 1.00    Years: 40.00    Pack years: 40.00    Types: Cigarettes  . Smokeless tobacco: Never Used  Substance and  Sexual Activity  . Alcohol use: No  . Drug use: Never  . Sexual activity: Never  Lifestyle  . Physical activity:    Days per week: Not on file    Minutes per session: Not on file  . Stress: Not on file  Relationships  . Social connections:    Talks on phone: Not on file    Gets together: Not on file    Attends religious service: Not on file    Active member of club or organization: Not on file    Attends meetings of clubs or organizations: Not on file    Relationship status: Not on file  . Intimate partner violence:    Fear of current or ex partner: Not on file    Emotionally abused: Not on file    Physically abused: Not on file    Forced sexual activity: Not on file  Other Topics Concern  . Not on file  Social History Narrative   Kristi Webb' daughter Kristi Webb recently took over custody of her mother in court. As of last Saturday (05/24/12) Kristi Webb is living with this daughter. The patient was previously living with a female roommate for 10-12 years who did not allow the family to see the patient for the last 2 years.  Widowed, 4 children, 2 boys 2 girls.  Several grandchildren and several great grandchildren.  High school graduate worked in the [employer]          04/16/18- Caffeine: 2-3 cups weekly    Family History  Problem Relation Age of Onset  . Hypertension Unknown        daughter 1(living)  . Suicidality Unknown        daughter 2  . Seizures Neg Hx     Past Medical History:  Diagnosis Date  . Dementia    worse since 2012  . FTT (failure to thrive) in adult   . 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 SDH (subdural hematoma) (HCC)    b/l 11/2011 at [facility] placed on keppra 250 mg bid   . Seizure (HCC)   . Thrush   . Thyroid disease     Past Surgical History:  Procedure Laterality Date  . OTHER SURGICAL HISTORY  04/21/2011   left hip internal fixation and reduction intertrochanteric and femoral neck  . OTHER SURGICAL HISTORY  ~2 yrs ago   brain surgery      Current Outpatient Medications  Medication Sig Dispense Refill  . acetaminophen (TYLENOL) 500 MG tablet Take 500-1,000 mg by mouth every 6 (six) hours as needed for mild pain, moderate pain or fever.    . Ascorbic Acid (VITAMIN C) 1000 MG tablet Take 1,000 mg by mouth daily.    Marland Kitchen aspirin 81 MG tablet Take 81 mg by mouth daily.    Marland Kitchen CALCIUM PO Take 600 mg by mouth daily.    . cetirizine (ZYRTEC) 10 MG tablet Take 1 tablet (10 mg total) by mouth daily. (Patient taking differently: Take 10 mg by mouth daily as needed. ) 30 tablet 11  . donepezil (ARICEPT) 10 MG tablet Take 1 tablet (10  mg total) by mouth at bedtime. 30 tablet 11  . levothyroxine (SYNTHROID) 75 MCG tablet Take 1 tablet (75 mcg total) by mouth daily. 30 tablet 11  . memantine (NAMENDA) 5 MG tablet Take 1 tablet (5 mg total) by mouth daily. 60 tablet 5  . mometasone (NASONEX) 50 MCG/ACT nasal spray Place 2 sprays into the nose daily. (Patient taking differently: Place 2 sprays into the nose as needed. ) 17 g 12  . Multiple Vitamins-Minerals (MULTIVITAMIN PO) Take by mouth. Silver multivitamin    . divalproex (DEPAKOTE SPRINKLES) 125 MG capsule Take 1 capsule (125 mg total) by mouth 2 (two) times daily. 60 capsule 11   No current facility-administered medications for this visit.     Allergies as of 04/16/2018  . (No Known Allergies)    Vitals: BP 122/68 (BP Location: Left Arm, Patient Position: Sitting)   Pulse 68   Ht 5\' 7"  (1.702 m)   BMI 28.98 kg/m  Last Weight:  Wt Readings from Last 1 Encounters:  09/14/16 185 lb (83.9 kg)   Last Height:   Ht Readings from Last 1 Encounters:  04/16/18 5\' 7"  (1.702 m)    Physical exam: Exam: Gen: NAD, nonverbal              CV: RRR, no MRG. No Carotid Bruits. No peripheral edema, warm, nontender Eyes: Conjunctivae clear without exudates or hemorrhage  Neuro: Detailed Neurologic Exam  Speech: No verbak, does not follow any commands  Cognition: does not follow  commands, non verbal    The patient is not oriented to person, place, and time;     recent and remote memory impaired;     language non verbal;     Impaired attention, concentration, fund of knowledge Cranial Nerves: hypomimia    The pupils are equal, round, and reactive to light. Attempted fundoscopic exam could not visualize. Patient blinks to threat however visual field testing difficult due to late-stage dementia. Extraocular movements are intact with impaired smooth pursuit. Trigeminal sensation is intact and the muscles of mastication are normal. The face is symmetric. The palate elevates in the midline. Hearing appears intact to voice, she will respond to voice. Voice is non verbal. The tongue has normal motion without fasciculations.   Coordination:    difficult due to late-stage dementia but no dysmetria noted, attempted exam  Gait:   Wheelchair bound, attempted cannot stand independently  Motor Observation:     no involuntary movements noted. Tone:    Increased tone right arm  Posture:    Posture is slightly slumped in wheel chair    Strength:     difficult due to late-stage dementia but moving all extremities     Sensation: intact to stim     Reflex Exam:  DTR's:    Deep tendon reflexes in the upper and lower extremities are symmetrical bilaterally.   Toes:    The toes are equiv bilaterally.   Clonus:    Clonus is absent.      Assessment/Plan:  84 y.o. female here as a referral from Dr. Hart RochesterHollis for dementia for 6 years. PMHx late stage dementia, FTT, SDH, thyroid disease.  Patient is having repeated episodes of alteration of awareness which then involves gaze deviation and unresponsiveness likely seizures.  -Given age and late stage dementia we will start Depakote twice daily but at a very low dose 125 twice daily and slowly increase based on response.  They will be seeing her primary care in several months recommended  at that time they check CMP and liver enzymes  as well as CBC with primary care as they do not want to come back for follow-up here for 6 months and state difficult to come back for labs .  Advised Depakote can cause metabolic abnormalities as well as liver function abnormalities and labs are recommended in 4 weeks.  If patient has repeated episodes they are to call me to titrate medication.  Did discuss other side effects as well.  -Late stage dementia, can continue Aricept and Namenda at this time.  Patient's daughter is very educated and has been caring for patient for 5 years and understands the natural history and progression of dementia.  There is no behavioral component.  We will check for B12 deficiency and RPR as I do not see those completed in her past medical history and these could be treated.  Discussed Patients with epilepsy have a small risk of sudden unexpected death, a condition referred to as sudden unexpected death in epilepsy (SUDEP). SUDEP is defined specifically as the sudden, unexpected, witnessed or unwitnessed, nontraumatic and nondrowning death in patients with epilepsy with or without evidence for a seizure, and excluding documented status epilepticus, in which post mortem examination does not reveal a structural or toxicologic cause for death   Orders Placed This Encounter  Procedures  . B12 and Folate Panel  . Methylmalonic acid, serum  . RPR    Meds ordered this encounter  Medications  . DISCONTD: divalproex (DEPAKOTE ER) 250 MG 24 hr tablet    Sig: Take 1 tablet (250 mg total) by mouth at bedtime.    Dispense:  30 tablet    Refill:  11  . divalproex (DEPAKOTE SPRINKLES) 125 MG capsule    Sig: Take 1 capsule (125 mg total) by mouth 2 (two) times daily.    Dispense:  60 capsule    Refill:  11    Patient cannot swallow pills will see if insurance will cover sprinkles. Discontinue prescription for Depakote ER.    Naomie Dean, MD  [facility] [address] [place], [place]  [address]  Phone [phone] Fax [phone]

## 2018-04-17 ENCOUNTER — Telehealth: Payer: Self-pay | Admitting: *Deleted

## 2018-04-17 ENCOUNTER — Encounter: Payer: Self-pay | Admitting: Neurology

## 2018-04-17 DIAGNOSIS — G40209 Localization-related (focal) (partial) symptomatic epilepsy and epileptic syndromes with complex partial seizures, not intractable, without status epilepticus: Secondary | ICD-10-CM | POA: Insufficient documentation

## 2018-04-17 DIAGNOSIS — G40201 Localization-related (focal) (partial) symptomatic epilepsy and epileptic syndromes with complex partial seizures, not intractable, with status epilepticus: Secondary | ICD-10-CM

## 2018-04-17 MED ORDER — DIVALPROEX SODIUM 125 MG PO CSDR: 125.0000 mg | DELAYED_RELEASE_CAPSULE | Freq: Two times a day (BID) | ORAL | 11 refills | Status: DC

## 2018-04-17 NOTE — Telephone Encounter (Signed)
LVM informing patient that his labs look fine. Left number for any questions.

## 2018-04-19 LAB — B12 AND FOLATE PANEL
Folate: >20 ng/mL (ref >3.0)
Vitamin B-12: 1257 pg/mL — ABNORMAL HIGH (ref 232–1245)

## 2018-04-19 LAB — METHYLMALONIC ACID, SERUM: Methylmalonic Acid: 115 nmol/L (ref 0–378)

## 2018-04-19 LAB — RPR: RPR: NONREACTIVE

## 2018-04-23 ENCOUNTER — Ambulatory Visit: Payer: Medicare Other | Admitting: Family Medicine

## 2018-10-20 ENCOUNTER — Ambulatory Visit: Payer: Medicare Other | Admitting: Neurology

## 2018-11-05 ENCOUNTER — Other Ambulatory Visit: Payer: Self-pay | Admitting: Family Medicine

## 2018-11-05 DIAGNOSIS — F0280 Dementia in other diseases classified elsewhere without behavioral disturbance: Secondary | ICD-10-CM

## 2018-11-05 DIAGNOSIS — G309 Alzheimer's disease, unspecified: Principal | ICD-10-CM

## 2018-11-11 ENCOUNTER — Other Ambulatory Visit: Payer: Self-pay | Admitting: Family Medicine

## 2018-12-08 ENCOUNTER — Other Ambulatory Visit: Payer: Self-pay | Admitting: Family Medicine

## 2018-12-08 DIAGNOSIS — F0280 Dementia in other diseases classified elsewhere without behavioral disturbance: Secondary | ICD-10-CM

## 2018-12-08 DIAGNOSIS — G309 Alzheimer's disease, unspecified: Principal | ICD-10-CM

## 2019-01-05 ENCOUNTER — Other Ambulatory Visit: Payer: Self-pay | Admitting: Family Medicine

## 2019-01-05 DIAGNOSIS — F0280 Dementia in other diseases classified elsewhere without behavioral disturbance: Secondary | ICD-10-CM

## 2019-01-05 DIAGNOSIS — G309 Alzheimer's disease, unspecified: Principal | ICD-10-CM

## 2019-01-06 ENCOUNTER — Other Ambulatory Visit: Payer: Self-pay

## 2019-01-07 NOTE — Telephone Encounter (Signed)
Patient is being followed by Dr. Lucia Gaskins in neurology.

## 2019-01-28 ENCOUNTER — Other Ambulatory Visit: Payer: Self-pay | Admitting: Family Medicine

## 2019-01-28 DIAGNOSIS — F0280 Dementia in other diseases classified elsewhere without behavioral disturbance: Secondary | ICD-10-CM

## 2019-01-28 DIAGNOSIS — E039 Hypothyroidism, unspecified: Secondary | ICD-10-CM

## 2019-01-28 DIAGNOSIS — G309 Alzheimer's disease, unspecified: Principal | ICD-10-CM

## 2019-01-29 ENCOUNTER — Other Ambulatory Visit: Payer: Self-pay | Admitting: Family Medicine

## 2019-01-29 DIAGNOSIS — E039 Hypothyroidism, unspecified: Secondary | ICD-10-CM

## 2019-01-29 DIAGNOSIS — F0280 Dementia in other diseases classified elsewhere without behavioral disturbance: Secondary | ICD-10-CM

## 2019-01-29 DIAGNOSIS — G309 Alzheimer's disease, unspecified: Principal | ICD-10-CM

## 2019-02-04 ENCOUNTER — Other Ambulatory Visit: Payer: Self-pay | Admitting: Family Medicine

## 2019-02-04 DIAGNOSIS — J309 Allergic rhinitis, unspecified: Secondary | ICD-10-CM

## 2019-02-04 DIAGNOSIS — F0280 Dementia in other diseases classified elsewhere without behavioral disturbance: Secondary | ICD-10-CM

## 2019-02-04 DIAGNOSIS — G309 Alzheimer's disease, unspecified: Secondary | ICD-10-CM

## 2019-02-04 DIAGNOSIS — E039 Hypothyroidism, unspecified: Secondary | ICD-10-CM

## 2019-02-09 ENCOUNTER — Other Ambulatory Visit: Payer: Self-pay | Admitting: Family Medicine

## 2019-02-09 DIAGNOSIS — E039 Hypothyroidism, unspecified: Secondary | ICD-10-CM

## 2019-02-09 DIAGNOSIS — G309 Alzheimer's disease, unspecified: Secondary | ICD-10-CM

## 2019-02-09 DIAGNOSIS — F0280 Dementia in other diseases classified elsewhere without behavioral disturbance: Secondary | ICD-10-CM

## 2019-02-25 ENCOUNTER — Ambulatory Visit (INDEPENDENT_AMBULATORY_CARE_PROVIDER_SITE_OTHER): Payer: Medicare Other | Admitting: Family Medicine

## 2019-02-25 ENCOUNTER — Other Ambulatory Visit: Payer: Self-pay

## 2019-02-25 ENCOUNTER — Encounter: Payer: Self-pay | Admitting: Family Medicine

## 2019-02-25 DIAGNOSIS — F0280 Dementia in other diseases classified elsewhere without behavioral disturbance: Secondary | ICD-10-CM

## 2019-02-25 DIAGNOSIS — E039 Hypothyroidism, unspecified: Secondary | ICD-10-CM

## 2019-02-25 DIAGNOSIS — G309 Alzheimer's disease, unspecified: Secondary | ICD-10-CM

## 2019-02-25 MED ORDER — DONEPEZIL HCL 10 MG PO TABS: 10.0000 mg | ORAL_TABLET | Freq: Every day | ORAL | 3 refills | Status: DC

## 2019-02-25 MED ORDER — LEVOTHYROXINE SODIUM 75 MCG PO TABS: 75.0000 ug | ORAL_TABLET | Freq: Every day | ORAL | 3 refills | Status: DC

## 2019-02-25 MED ORDER — MEMANTINE HCL 5 MG PO TABS: 5.0000 mg | ORAL_TABLET | Freq: Every day | ORAL | 3 refills | Status: DC

## 2019-02-25 MED ORDER — DIVALPROEX SODIUM 125 MG PO CSDR: 125.0000 mg | DELAYED_RELEASE_CAPSULE | Freq: Two times a day (BID) | ORAL | 3 refills | Status: DC

## 2019-02-25 NOTE — Progress Notes (Signed)
  [facility]  Virtual Visit via Telephone Note  I connected with Kristi Webb on 02/25/19 at 11:00 AM EDT by telephone and verified that I am speaking with the correct person using two identifiers.   I discussed the limitations, risks, security and privacy concerns of performing an evaluation and management service by telephone and the availability of in person appointments. I also discussed with the patient that there may be a patient responsible charge related to this service. The patient expressed understanding and agreed to proceed.   History of Present Illness: Kristi Webb  has a past medical history of Dementia, FTT (failure to thrive) in adult, Polyclonal gammopathy, SDH (subdural hematoma) (HCC), Seizure (HCC), Thrush, and Thyroid disease. Patient is unable to speak. Her daughter, Kristi Webb, serves as her Kristi Webb and is conducting the interview. She reports that her mother is stable on all medications. She was last seen in July 2019 by Neurology for dementia and seizure like activity. Her daughter reports that her last episode was 3 months ago. She reports compliance with her medications and denies side effects at the present time. No problems or concerns today.    Observations/Objective: Patient with regular voice tone, rate and rhythm. Speaking calmly and is in no apparent distress.    Assessment and Plan: 1. Alzheimer's dementia without behavioral disturbance, unspecified timing of dementia onset (HCC) - donepezil (ARICEPT) 10 MG tablet; Take 1 tablet (10 mg total) by mouth at bedtime.  Dispense: 90 tablet; Refill: 3 - memantine (NAMENDA) 5 MG tablet; Take 1 tablet (5 mg total) by mouth daily.  Dispense: 90 tablet; Refill: 3 - divalproex (DEPAKOTE SPRINKLES) 125 MG capsule; Take 1 capsule (125 mg total) by mouth 2 (two) times daily.  Dispense: 180 capsule; Refill: 3  2. Acquired hypothyroidism - levothyroxine (SYNTHROID) 75 MCG  tablet; Take 1 tablet (75 mcg total) by mouth daily.  Dispense: 90 tablet; Refill: 3   Advised daughter that patient will need to have labs taken in the next month. Orders placed. Daughter is concerned about her mother being high risk and will come for labs once COVID restrictions are lifted.    Follow Up Instructions:  We discussed hand washing, using hand sanitizer when soap and water are not available, only going out when absolutely necessary, and social distancing. Explained to patient that she is immunocompromised and will need to take precautions during this time.   I discussed the assessment and treatment plan with the patient. The patient was provided an opportunity to ask questions and all were answered. The patient agreed with the plan and demonstrated an understanding of the instructions.   The patient was advised to call back or seek an in-person evaluation if the symptoms worsen or if the condition fails to improve as anticipated.  I provided 15 minutes of non-face-to-face time during this encounter.  Ms. Andr L. Riley Lam, FNP-BC [facility] [address] [place], [place] [address] [phone]

## 2019-06-10 ENCOUNTER — Encounter (HOSPITAL_COMMUNITY): Payer: Self-pay

## 2019-06-10 ENCOUNTER — Encounter (HOSPITAL_COMMUNITY): Payer: Self-pay | Admitting: *Deleted

## 2019-09-04 ENCOUNTER — Ambulatory Visit: Payer: Medicare Other | Admitting: Family Medicine

## 2019-10-30 ENCOUNTER — Encounter: Payer: Self-pay | Admitting: Nurse Practitioner

## 2019-10-30 ENCOUNTER — Other Ambulatory Visit: Payer: Self-pay

## 2019-10-30 ENCOUNTER — Ambulatory Visit (INDEPENDENT_AMBULATORY_CARE_PROVIDER_SITE_OTHER): Payer: Medicare Other | Admitting: Nurse Practitioner

## 2019-10-30 DIAGNOSIS — J309 Allergic rhinitis, unspecified: Secondary | ICD-10-CM | POA: Diagnosis not present

## 2019-10-30 DIAGNOSIS — G40909 Epilepsy, unspecified, not intractable, without status epilepticus: Secondary | ICD-10-CM | POA: Diagnosis not present

## 2019-10-30 DIAGNOSIS — G309 Alzheimer's disease, unspecified: Secondary | ICD-10-CM

## 2019-10-30 DIAGNOSIS — F0280 Dementia in other diseases classified elsewhere without behavioral disturbance: Secondary | ICD-10-CM

## 2019-10-30 DIAGNOSIS — E039 Hypothyroidism, unspecified: Secondary | ICD-10-CM | POA: Diagnosis not present

## 2019-10-30 NOTE — Progress Notes (Signed)
Virtual Visit via Telephone Note  I connected with Kristi Webb daughter Kristi Webb on 10/30/19 at  1:18 PM EST by telephone and verified that I am speaking with the correct person using two identifiers.   I discussed the limitations, risks, security and privacy concerns of performing an evaluation and management service by telephone and the availability of in person appointments. I also discussed with the patient that there may be a patient responsible charge related to this service. The patient expressed understanding and agreed to proceed.   History of Present Illness: Kristi Webb has a history significant for seizures, dementia, hypothyroidism and failure to thrive.  Her daughter Kristi Webb is the person that I am speaking with this afternoon.  She admits that her mom's last seizure was approximately 2 weeks ago.  She admits that 2 to 3 months ago the seizures had increased to weekly.  She feels like this was related to her not giving her the Depakote as prescribed.  The order was for the Depakote sprinkles 125 mg capsule twice daily.  She admits that she was only doing this once daily because she felt that it was over sedating her mother.  Now that she has the medications consistently in her system she is doing better with no seizure activity.  She admits that the seizures have been while she is eating in the last about 2 to 3 seconds.  "She seems to stare off".  She denies any fever, chills, shortness of breath, cough, signs and symptoms of any upper respiratory infections.  Kristi Webb admits that her mom is eating well and taking in plenty of fluids including water and juice.  She does drink and l occasionally Ensure or boost.  This is not routinely given secondary to diarrhea.  She admits that her mother continues to use Namenda 5 mg daily for the treatment of her dementia.  She states that she is no longer taking Aricept 10 mg nightly.  She did not indicate how long it has been since  she was off his medications.  She denies any change in her condition being off the Aricept.  Kristi Webb is concerned that her mother needs home health to assist her in the home.  She feels like this is related to several different issues.  She does give her mother a multivitamin and an additional vitamin D.  She wants to make sure that she is not getting too much.  She admits that Kristi Webb is total care for feeding, ADLs and toileting.  She along with persons that assist her in getting Kristi Webb up daily to her wheelchair.  Kristi Webb has a history of a decubitus ulcer on her sacrum.  The daughter admits that she is turned every 2 hours.  Zinc, aloe vera and different creams are used as needed.  However she still notices that the old pressure area continues to get "red".  She denies any open areas but is concerned about the reoccurrence of this red area.  Again she feels like Kristi Webb is eating well and has maintained good nutrition.  She would like home health nursing to see evaluate this area to see what else can be done.  Kristi Webb is concern about the condition of her mother's hospital bed and mattress.  She admits that she replaced several parts on her bed.  The mattress is worn.  She would also like for an evaluation of her total care wheelchair which reclines  and both feet rest are able to be elevated.   Observations/Objective: Afebrile   Assessment and Plan: Alzheimer's dementia without behavioral disturbance, unspecified timing of dementia onset (HCC)    Allergic rhinitis, unspecified seasonality, unspecified trigger    Acquired hypothyroidism    Seizure disorder (HCC)         Follow Up Instructions: Reinforced with Kristi Webb what to look for with aspiration pneumonia.  Reinforce the need to be very careful if she felt that seizure activity was during meals which may be precipitated by something else.  Reinforce the use of medication as directed  Reinforced COVID-19  preventative measures of continuing to limit visitors, good hand washing for all that come in contact with [name] as well as Kristi Webb, good disinfecting techniques  Home health referral made   I discussed the assessment and treatment plan with the patient. The patient was provided an opportunity to ask questions and all were answered. The patient agreed with the plan and demonstrated an understanding of the instructions.   The patient was advised to call back or seek an in-person evaluation if the symptoms worsen or if the condition fails to improve as anticipated.  I provided 25 minutes of non-face-to-face time during this encounter.   Kristi Merino, NP

## 2019-10-30 NOTE — Patient Instructions (Addendum)
Preventing Pressure Injuries  A pressure injury, sometimes called a bedsore or a pressure ulcer, is an injury to the skin and underlying tissue caused by pressure. A pressure injury can happen when your skin presses against a surface, such as a mattress or wheelchair seat, for too long. The pressure on the blood vessels causes reduced blood flow to your skin. This can eventually cause the skin tissue to die and break down into a wound. Pressure injuries usually develop:  Over bony parts of the body, such as the tailbone, shoulders, elbows, hips, and heels.  Under medical devices, such as respiratory equipment, stockings, tubes, and splints. How can this condition affect me? Pressure injuries are caused by a lack of blood supply to an area of skin. These injuries begin as a reddened area on the skin and can become an open sore. They can result from intense pressure over a short period of time or from less pressure over a long period of time. Pressure injuries can vary in severity. They can cause pain, muscle damage, and infection. What can increase my risk? This condition is more likely to develop in people who:  Are in the hospital or an extended care facility.  Are bedridden or in a wheelchair.  Have an injury or disease that keeps them from: ? Moving normally. ? Feeling pain or pressure. ? Communicating if they feel pain or pressure.  Have a condition that: ? Makes them sleepy or less alert. ? Causes poor blood flow.  Need to wear a medical device.  Have poor control of their bladder or bowel functions (incontinence).  Have poor nutrition (malnutrition).  Have had this condition before.  Are of certain ethnicities. People of African American, Latino, or Hispanic descent are at higher risk compared to other ethnic groups. What actions can I take to prevent pressure injuries? Reducing and redistributing pressure  Do not lie or sit in one position for a long time. Move or change  position: ? Every hour when out of bed in a chair. ? Every two hours when in bed. ? As often as told by your health care provider.  Use pillows, wedges, or cushions to redistribute pressure. Ask your health care provider to recommend a mattress, cushions, or pads for you.  Use medical devices that do not rub your skin. Tell your health care provider if one of your medical devices is causing pain or irritation. Skin care If you are in the hospital, your health care providers:  Will inspect your skin, including areas under or around medical devices, at least twice a day.  May recommend that you use certain types of bedding to help prevent pressure injuries. These may include a pad, mattress, or chair cushion that is filled with gel, air, water, or foam.  Will evaluate your nutrition and consult a dietitian if needed.  Will inspect and change any wound dressings regularly.  May help you move into different positions every few hours.  Will adjust any medical devices and braces as needed to limit pressure on your skin.  Will keep your skin clean and dry.  May use gentle cleansers and skin protectants if you are incontinent.  Will moisturize any dry skin. In general, at home:  Keep your skin clean and dry. Gently pat your skin dry.  Do not rub or massage bony areas of your skin.  Moisturize dry skin.  Use gentle cleansers and skin protectants routinely if you are incontinent.  Check your skin at least once   a day for any changes in color and for any new blisters or sores. Make sure to check under and around any medical devices and between skin folds. Have a caregiver do this for you if you are not able.  Lifestyle  Be as active as you can every day. Ask your health care provider to suggest safe exercises or activities.  Do not abuse drugs or alcohol.  Do not use any products that contain nicotine or tobacco, such as cigarettes, e-cigarettes, and chewing tobacco. If you need  help quitting, ask your health care provider. General instructions   Take over-the-counter and prescription medicines only as told by your health care provider.  Work with your health care provider to manage any chronic health conditions.  Eat a healthy diet that includes protein, vitamins, and minerals. Ask your health care provider what types of food you should eat.  Drink enough fluid to keep your urine pale yellow.  Keep all follow-up visits as told by your health care provider. This is important. Contact a health care provider if you:  Feel or see any changes in your skin. Summary  A pressure injury, sometimes called a bedsore or a pressure ulcer, is an injury to the skin and underlying tissue caused by pressure.  Do not lie or sit in one position for a long time.  Check your skin at least once a day for any changes in color and for any new blisters or sores.  Make sure to check under and around any medical devices and between skin folds. Have a caregiver do this for you if you are not able.  Eat a healthy diet that includes protein, vitamins, and minerals. Ask your health care provider what types of food you should eat. This information is not intended to replace advice given to you by your health care provider. Make sure you discuss any questions you have with your health care provider. Document Revised: 01/09/2019 Document Reviewed: 06/10/2018 Elsevier Patient Education  2020 [place]. COVID-19: How to Protect Yourself and Others Know how it spreads  There is currently no vaccine to prevent coronavirus disease 2019 (COVID-19).  The best way to prevent illness is to avoid being exposed to this virus.  The virus is thought to spread mainly from person-to-person. ? Between people who are in close contact with one another (within about 6 feet). ? Through respiratory droplets produced when an infected person coughs, sneezes or talks. ? These droplets can land in the  mouths or noses of people who are nearby or possibly be inhaled into the lungs. ? COVID-19 may be spread by people who are not showing symptoms. Everyone should Clean your hands often  Wash your hands often with soap and water for at least 20 seconds especially after you have been in a public place, or after blowing your nose, coughing, or sneezing.  If soap and water are not readily available, use a hand sanitizer that contains at least 60% alcohol. Cover all surfaces of your hands and rub them together until they feel dry.  Avoid touching your eyes, nose, and mouth with unwashed hands. Avoid close contact  Limit contact with others as much as possible.  Avoid close contact with people who are sick.  Put distance between yourself and other people. ? Remember that some people without symptoms may be able to spread virus. ? This is especially important for people who are at higher risk of getting very [employer].cy Cover your mouth and nose with a  mask when around others  You could spread COVID-19 to others even if you do not feel sick.  Everyone should wear a mask in public settings and when around people not living in their household, especially when social distancing is difficult to maintain. ? Masks should not be placed on young children under age 56, anyone who has trouble breathing, or is unconscious, incapacitated or otherwise unable to remove the mask without assistance.  The mask is meant to protect other people in case you are infected.  Do NOT use a facemask meant for a Research scientist (physical sciences).  Continue to keep about 6 feet between yourself and others. The mask is not a substitute for social distancing. Cover coughs and sneezes  Always cover your mouth and nose with a tissue when you cough or sneeze or use the inside of your elbow.  Throw used tissues in the trash.  Immediately wash your hands with  soap and water for at least 20 seconds. If soap and water are not readily available, clean your hands with a hand sanitizer that contains at least 60% alcohol. Clean and disinfect  Clean AND disinfect frequently touched surfaces daily. This includes tables, doorknobs, light switches, countertops, handles, desks, phones, keyboards, toilets, faucets, and sinks. ktimeonline.com  If surfaces are dirty, clean them: Use detergent or soap and water prior to disinfection.  Then, use a household disinfectant. You can see a list of EPA-registered household disinfectants here. [employer] 06/03/2019 This information is not intended to replace advice given to you by your health care provider. Make sure you discuss any questions you have with your health care provider. Document Revised: 06/11/2019 Document Reviewed: 04/09/2019 Elsevier Patient Education  2020 [employer].

## 2019-11-10 ENCOUNTER — Telehealth: Payer: Self-pay

## 2019-11-10 NOTE — Telephone Encounter (Signed)
Called, no answer. Left a message for daughter to call back regarding home health message that was sent today. They have not been able to see the patient due to daughter "not letting them in" and asked me to call to see what was going on.

## 2019-11-11 ENCOUNTER — Other Ambulatory Visit: Payer: Self-pay | Admitting: Family Medicine

## 2019-11-11 ENCOUNTER — Telehealth: Payer: Self-pay | Admitting: Nurse Practitioner

## 2019-11-11 DIAGNOSIS — G309 Alzheimer's disease, unspecified: Secondary | ICD-10-CM

## 2019-11-11 DIAGNOSIS — F0280 Dementia in other diseases classified elsewhere without behavioral disturbance: Secondary | ICD-10-CM

## 2019-11-11 NOTE — Telephone Encounter (Signed)
Pt returning call about healthcare agency. Please call patients daughter Kristi Webb (patients legal guardian) back

## 2019-11-11 NOTE — Telephone Encounter (Signed)
Called and spoke with Pt's daughter Lynden Ang. She states she does not like [facility] and asked that we use a different home health agency. She states I can try [facility] but she in the mean time will do some re-searching on this.   Crystal, She is also asking about a private home care nurse that could come out once a month and do a visit and maybe draw labs for her. I have no information regarding this. Do you mind, contacting vicky regarding this? Please advise.

## 2019-11-12 ENCOUNTER — Telehealth: Payer: Self-pay | Admitting: Internal Medicine

## 2019-11-12 ENCOUNTER — Telehealth: Payer: Self-pay | Admitting: Family Medicine

## 2019-11-12 NOTE — Telephone Encounter (Signed)
Called. The nurse who is working her case was busy and I left a message to call back. This was faxed when referral was faxed yesterday. Thanks!

## 2019-11-12 NOTE — Telephone Encounter (Signed)
Lynden Ang Siedschlag (daughter) called to follow up about health homecare

## 2019-11-12 NOTE — Telephone Encounter (Signed)
Crystal, Daughter is asking if you have any information about private home care nurse. She is asking if you could email this to her? [email or web address].

## 2020-01-07 ENCOUNTER — Other Ambulatory Visit: Payer: Self-pay | Admitting: Family Medicine

## 2020-01-07 DIAGNOSIS — E039 Hypothyroidism, unspecified: Secondary | ICD-10-CM

## 2020-01-07 DIAGNOSIS — F0280 Dementia in other diseases classified elsewhere without behavioral disturbance: Secondary | ICD-10-CM

## 2020-04-28 DIAGNOSIS — Z23 Encounter for immunization: Secondary | ICD-10-CM | POA: Diagnosis not present

## 2020-05-09 ENCOUNTER — Telehealth: Payer: Self-pay

## 2020-05-09 DIAGNOSIS — F0280 Dementia in other diseases classified elsewhere without behavioral disturbance: Secondary | ICD-10-CM

## 2020-05-09 MED ORDER — DIVALPROEX SODIUM 125 MG PO CSDR: 125.0000 mg | DELAYED_RELEASE_CAPSULE | Freq: Two times a day (BID) | ORAL | 3 refills | Status: DC

## 2020-05-09 NOTE — Telephone Encounter (Signed)
Refill sent to pharmacy.   

## 2020-06-11 DIAGNOSIS — Z23 Encounter for immunization: Secondary | ICD-10-CM | POA: Diagnosis not present

## 2020-07-27 ENCOUNTER — Telehealth (INDEPENDENT_AMBULATORY_CARE_PROVIDER_SITE_OTHER): Payer: Medicare Other | Admitting: Nurse Practitioner

## 2020-07-27 DIAGNOSIS — G309 Alzheimer's disease, unspecified: Secondary | ICD-10-CM | POA: Diagnosis not present

## 2020-07-27 DIAGNOSIS — J309 Allergic rhinitis, unspecified: Secondary | ICD-10-CM

## 2020-07-27 DIAGNOSIS — F0280 Dementia in other diseases classified elsewhere without behavioral disturbance: Secondary | ICD-10-CM

## 2020-07-27 DIAGNOSIS — Z09 Encounter for follow-up examination after completed treatment for conditions other than malignant neoplasm: Secondary | ICD-10-CM

## 2020-07-27 DIAGNOSIS — E039 Hypothyroidism, unspecified: Secondary | ICD-10-CM | POA: Diagnosis not present

## 2020-07-27 DIAGNOSIS — Z7401 Bed confinement status: Secondary | ICD-10-CM

## 2020-07-27 DIAGNOSIS — M1990 Unspecified osteoarthritis, unspecified site: Secondary | ICD-10-CM

## 2020-07-27 DIAGNOSIS — G40909 Epilepsy, unspecified, not intractable, without status epilepticus: Secondary | ICD-10-CM | POA: Diagnosis not present

## 2020-07-27 MED ORDER — CETIRIZINE HCL 10 MG PO TABS: 10.0000 mg | ORAL_TABLET | Freq: Every day | ORAL | 3 refills | Status: DC

## 2020-07-27 NOTE — Progress Notes (Signed)
   [facility] Patient [facility] [address] [place], [place]  [address] Phone:  [phone]   Fax:  [phone] Virtual Visit via Telephone Note  I connected with SAPPHIRA HARJO on 07/29/20 at 11:15 AM EDT by telephone and verified that I am speaking with the correct person using two identifiers.   I discussed the limitations, risks, security and privacy concerns of performing an evaluation and management service by telephone and the availability of in person appointments. I also discussed with the patient that there may be a patient responsible charge related to this service. The patient expressed understanding and agreed to proceed.  Patient home Provider Office  History of Present Illness: CC: Follow-up PT daughter states she wants her mother blood work checked, refill medication Pt daughter states she would like her mother to have a wheelchair, bed. Also pt daughter states her mother L arm is stiff and sinus drainage with red eye. X OFF AND ON   Upper Respiratory Infection Patient complains of symptoms of a URI. Symptoms include post nasal drip. Onset of symptoms was a few days ago, and has been unchanged since that time. Treatment to date: none.  She has a history of allergies however has not been taking her Zyrtec as prescribed.  Joint/Muscle Pain Patient complains of arthralgias, which have/has been present for several Years. Pain is located in multiple joints. The pain is described as Unable to determine, Associated symptoms include: decreased range of motion and tenderness. The patient has tried OTC ointments for pain, with minimal relief and had no relief from Tylenol. Related to injury: no.  She is bed bound however up each a day and goes down at 5 pm for example and then for back in bed for night time She has been under the care of [facility] in the past.  However now she is caring for her at home with a team of people. She needs a WC and hospital bed . She has  purchased a topper. She does get occasional red area.  However they treat immediately with topical agents which seems to be effective.   Observations/Objective: No exam today due to virtual visit  Assessment and Plan: Assessment  Primary Diagnosis & Pertinent Problem List: The primary encounter diagnosis was Arthritis. Diagnoses of Allergic rhinitis, unspecified seasonality, unspecified trigger, Acquired hypothyroidism, Seizure disorder (HCC), and Need for follow-up by home health service were also pertinent to this visit.  Visit Diagnosis: 1. Arthritis  Encouraged the use of Tylenol arthritis, Salonpas, Blue Emu or capsaicin  2. Allergic rhinitis, unspecified seasonality, unspecified trigger  Refill on Zyrtec  3. Acquired hypothyroidism  Labs pending, will continue with current regimen  4. Seizure disorder (HCC)  Continue with current regimen  5. Need for follow-up by home health service    Follow Up Instructions: Follow-up to be determined   I discussed the assessment and treatment plan with the patient. The patient was provided an opportunity to ask questions and all were answered. The patient agreed with the plan and demonstrated an understanding of the instructions.   The patient was advised to call back or seek an in-person evaluation if the symptoms worsen or if the condition fails to improve as anticipated.  I provided 22  minutes of non-face-to-face time during this encounter.   [name], NP

## 2020-07-29 ENCOUNTER — Encounter: Payer: Self-pay | Admitting: Nurse Practitioner

## 2020-07-29 NOTE — Addendum Note (Signed)
Addended by: [name] on: 07/29/2020 12:17 PM   Modules accepted: Orders

## 2020-10-27 ENCOUNTER — Other Ambulatory Visit: Payer: Self-pay | Admitting: Nurse Practitioner

## 2020-10-27 DIAGNOSIS — E039 Hypothyroidism, unspecified: Secondary | ICD-10-CM

## 2020-10-27 DIAGNOSIS — F0280 Dementia in other diseases classified elsewhere without behavioral disturbance: Secondary | ICD-10-CM

## 2020-10-27 NOTE — Telephone Encounter (Signed)
Please see refill requests 

## 2020-11-29 DIAGNOSIS — G40909 Epilepsy, unspecified, not intractable, without status epilepticus: Secondary | ICD-10-CM | POA: Diagnosis not present

## 2020-11-29 DIAGNOSIS — M6281 Muscle weakness (generalized): Secondary | ICD-10-CM | POA: Diagnosis not present

## 2020-11-29 DIAGNOSIS — J302 Other seasonal allergic rhinitis: Secondary | ICD-10-CM | POA: Diagnosis not present

## 2020-11-29 DIAGNOSIS — Z741 Need for assistance with personal care: Secondary | ICD-10-CM | POA: Diagnosis not present

## 2020-11-29 DIAGNOSIS — R2689 Other abnormalities of gait and mobility: Secondary | ICD-10-CM | POA: Diagnosis not present

## 2020-11-29 DIAGNOSIS — Z0000 Encounter for general adult medical examination without abnormal findings: Secondary | ICD-10-CM | POA: Diagnosis not present

## 2020-11-29 DIAGNOSIS — F0390 Unspecified dementia without behavioral disturbance: Secondary | ICD-10-CM | POA: Diagnosis not present

## 2020-12-22 ENCOUNTER — Emergency Department (HOSPITAL_COMMUNITY): Payer: Medicare Other

## 2020-12-22 ENCOUNTER — Other Ambulatory Visit: Payer: Self-pay

## 2020-12-22 ENCOUNTER — Emergency Department (HOSPITAL_COMMUNITY)
Admission: EM | Admit: 2020-12-22 | Discharge: 2020-12-22 | Disposition: A | Discharge status: Home / Self Care | Payer: Medicare Other | Attending: Emergency Medicine | Admitting: Emergency Medicine

## 2020-12-22 DIAGNOSIS — F0390 Unspecified dementia without behavioral disturbance: Secondary | ICD-10-CM

## 2020-12-22 DIAGNOSIS — Z87891 Personal history of nicotine dependence: Secondary | ICD-10-CM | POA: Insufficient documentation

## 2020-12-22 DIAGNOSIS — R279 Unspecified lack of coordination: Secondary | ICD-10-CM | POA: Diagnosis not present

## 2020-12-22 DIAGNOSIS — R0902 Hypoxemia: Secondary | ICD-10-CM | POA: Diagnosis not present

## 2020-12-22 DIAGNOSIS — R9431 Abnormal electrocardiogram [ECG] [EKG]: Secondary | ICD-10-CM | POA: Diagnosis not present

## 2020-12-22 DIAGNOSIS — F0281 Dementia in other diseases classified elsewhere with behavioral disturbance: Secondary | ICD-10-CM | POA: Diagnosis not present

## 2020-12-22 DIAGNOSIS — Z79899 Other long term (current) drug therapy: Secondary | ICD-10-CM | POA: Diagnosis not present

## 2020-12-22 DIAGNOSIS — Z743 Need for continuous supervision: Secondary | ICD-10-CM | POA: Diagnosis not present

## 2020-12-22 DIAGNOSIS — G309 Alzheimer's disease, unspecified: Secondary | ICD-10-CM | POA: Insufficient documentation

## 2020-12-22 DIAGNOSIS — I10 Essential (primary) hypertension: Secondary | ICD-10-CM | POA: Diagnosis not present

## 2020-12-22 DIAGNOSIS — R059 Cough, unspecified: Secondary | ICD-10-CM | POA: Diagnosis not present

## 2020-12-22 DIAGNOSIS — Z7982 Long term (current) use of aspirin: Secondary | ICD-10-CM | POA: Diagnosis not present

## 2020-12-22 DIAGNOSIS — G40909 Epilepsy, unspecified, not intractable, without status epilepticus: Secondary | ICD-10-CM | POA: Diagnosis not present

## 2020-12-22 DIAGNOSIS — R569 Unspecified convulsions: Secondary | ICD-10-CM | POA: Insufficient documentation

## 2020-12-22 DIAGNOSIS — F03C0 Unspecified dementia, severe, without behavioral disturbance, psychotic disturbance, mood disturbance, and anxiety: Secondary | ICD-10-CM

## 2020-12-22 DIAGNOSIS — I959 Hypotension, unspecified: Secondary | ICD-10-CM | POA: Diagnosis not present

## 2020-12-22 DIAGNOSIS — Z20822 Contact with and (suspected) exposure to covid-19: Secondary | ICD-10-CM | POA: Diagnosis not present

## 2020-12-22 LAB — CBC
HCT: 38.1 % (ref 36.0–46.0)
Hemoglobin: 12.6 g/dL (ref 12.0–15.0)
MCH: 28.6 pg (ref 26.0–34.0)
MCHC: 33.1 g/dL (ref 30.0–36.0)
MCV: 86.6 fL (ref 80.0–100.0)
Platelets: 211 10*3/uL (ref 150–400)
RBC: 4.4 MIL/uL (ref 3.87–5.11)
RDW: 15.9 % — ABNORMAL HIGH (ref 11.5–15.5)
WBC: 7.5 10*3/uL (ref 4.0–10.5)
nRBC: 0 % (ref 0.0–0.2)

## 2020-12-22 LAB — BASIC METABOLIC PANEL
Anion gap: 8 (ref 5–15)
BUN: 16 mg/dL (ref 8–23)
CO2: 24 mmol/L (ref 22–32)
Calcium: 8.8 mg/dL — ABNORMAL LOW (ref 8.9–10.3)
Chloride: 106 mmol/L (ref 98–111)
Creatinine, Ser: 0.55 mg/dL (ref 0.44–1.00)
GFR, Estimated: >60 mL/min (ref >60)
Glucose, Bld: 130 mg/dL — ABNORMAL HIGH (ref 70–99)
Potassium: 3.6 mmol/L (ref 3.5–5.1)
Sodium: 138 mmol/L (ref 135–145)

## 2020-12-22 LAB — RESP PANEL BY RT-PCR (FLU A&B, COVID) ARPGX2
Influenza A by PCR: NEGATIVE
Influenza B by PCR: NEGATIVE
SARS Coronavirus 2 by RT PCR: NEGATIVE

## 2020-12-22 LAB — VALPROIC ACID LEVEL: Valproic Acid Lvl: 11 ug/mL — ABNORMAL LOW (ref 50.0–100.0)

## 2020-12-22 MED ORDER — DIVALPROEX SODIUM 125 MG PO CSDR
125.0000 mg | DELAYED_RELEASE_CAPSULE | Freq: Once | ORAL | Status: DC
  Filled 2020-12-22 (×2): qty 1

## 2020-12-22 NOTE — ED Notes (Signed)
[facility] called, 4th one in line

## 2020-12-22 NOTE — Discharge Instructions (Addendum)
It was our pleasure to provide your ER care today - we hope that you feel better.  Continue depakote. Follow up with your neurologist in the next 2-3 days - inquire as to whether they want to adjust your depakote dose.   Also follow up closely with your primary care doctor in the next 1-2 weeks.   Your xray looks good - no pneumonia is noted. Your covid and flu tests are negative.   Return to ER if worse, new symptoms, fevers, new or severe pain, recurrent or persistent seizures, trouble breathing, or other concern.

## 2020-12-22 NOTE — ED Provider Notes (Signed)
[facility] EMERGENCY DEPARTMENT Provider Note   CSN: [id number] Arrival date & time: 12/22/20  1539     History Chief Complaint  Patient presents with  . Seizures    Kristi Webb is a 87 y.o. female.  Pt with hx advanced dementia arrives via EMS after family witnessed a couple minute seizure. Seizure felt to be consistent with prior seizures. No trauma during event. No other recent seizures reported. Pt is reported to be compliant w meds. At baseline, is not verbally responsive, bed bound, advanced dementia - level 5 caveat. No report of trauma/fall. No report of fever. EMS reports family concerned about chest congestion?cough.   The history is provided by the patient and the EMS personnel. The history is limited by the condition of the patient.  Seizures      Past Medical History:  Diagnosis Date  . Dementia (HCC)    worse since 2012  . FTT (failure to thrive) in adult   . 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 SDH (subdural hematoma) (HCC)    b/l 11/2011 at [facility] placed on keppra 250 mg bid   . Seizure (HCC)   . Thrush   . Thyroid disease     Patient Active Problem List   Diagnosis Date Noted  . Allergic rhinitis 10/30/2019  . Seizure disorder (HCC) 10/30/2019  . Complex partial seizure with impairment of consciousness at onset [facility]) 04/17/2018  . Hypokalemia 02/04/2014  . Aspiration pneumonia (HCC) 12/24/2013  . FUO (fever of unknown origin) 12/23/2013  . Personal history of tobacco use, presenting hazards to health 04/29/2013  . Shoulder joint pain 01/30/2013  . Disorder of bone and cartilage 12/01/2012  . Hx traumatic fracture 12/01/2012  . Problems influencing health status 12/01/2012  . Other specified diseases of blood and blood-forming organs 09/03/2012  . Aspiration pneumonitis (HCC) 07/06/2012  . Malnutrition (HCC) 05/29/2012  . Acute hypernatremia 05/28/2012  . Leukocytosis 05/28/2012  . Sacral decubitus ulcer, stage II (HCC) 05/28/2012   . Thyroid disorder 05/28/2012  . Thrush 05/28/2012  . FTT (failure to thrive) in adult 05/28/2012  . Tachypnea 05/28/2012  . Tachycardia 05/28/2012  . Status post craniotomy 05/19/2012  . Altered mental status 12/27/2011  . Dysphagia 12/27/2011  . Pressure ulcer, stage 2 (HCC) 12/05/2011  . Alzheimer's dementia without behavioral disturbance (HCC) 11/29/2011  . Hypothyroidism 11/29/2011  . Essential hypertension 11/29/2011  . Subdural hematoma (HCC) 11/29/2011  . Constipation 09/14/2011  . Heartburn 07/06/2011    Past Surgical History:  Procedure Laterality Date  . OTHER SURGICAL HISTORY  04/21/2011   left hip internal fixation and reduction intertrochanteric and femoral neck  . OTHER SURGICAL HISTORY  ~2 yrs ago   brain surgery     OB History   No obstetric history on file.     Family History  Problem Relation Age of Onset  . Hypertension Other        daughter 1(living)  . Suicidality Other        daughter 2  . Seizures Neg Hx     Social History   Tobacco Use  . Smoking status: Former Smoker    Packs/day: 1.00    Years: 40.00    Pack years: 40.00    Types: Cigarettes  . Smokeless tobacco: Never Used  Vaping Use  . Vaping Use: Never used  Substance Use Topics  . Alcohol use: No  . Drug use: Never    Home Medications Prior to Admission medications  Medication Sig Start Date End Date Taking? Authorizing Provider  acetaminophen (TYLENOL) 500 MG tablet Take 500-1,000 mg by mouth every 6 (six) hours as needed for mild pain, moderate pain or fever.    [provider]  Ascorbic Acid (VITAMIN C) 1000 MG tablet Take 1,000 mg by mouth daily.    [provider]  aspirin 81 MG tablet Take 81 mg by mouth daily.    [provider]  CALCIUM PO Take 600 mg by mouth daily.    [provider]  cetirizine (ZYRTEC) 10 MG tablet Take 1 tablet (10 mg total) by mouth daily. 07/27/20 07/27/21  [name], NP  Cholecalciferol (VITAMIN  D3) 10 MCG (400 UNIT) CAPS Take by mouth.    [provider]  divalproex (DEPAKOTE SPRINKLES) 125 MG capsule Take 1 capsule (125 mg total) by mouth 2 (two) times daily. 05/09/20   [name], NP  donepezil (ARICEPT) 10 MG tablet Take 1 tablet (10 mg total) by mouth at bedtime. 10/27/20   [name], NP  levothyroxine (SYNTHROID) 75 MCG tablet Take 1 tablet (75 mcg total) by mouth daily. 10/27/20 10/27/21  [name], NP  memantine (NAMENDA) 5 MG tablet Take 1 tablet (5 mg total) by mouth daily. 10/27/20   [name], NP  Multiple Vitamins-Minerals (MULTIVITAMIN PO) Take by mouth. Silver multivitamin    [provider]    Allergies    Patient has no known allergies.  Review of Systems   Review of Systems  Unable to perform ROS: Patient nonverbal  Neurological: Positive for seizures.  level 5 caveat - dementia, not verbally responsive  Physical Exam Updated Vital Signs BP (!) 94/50 (BP Location: Right Arm)   Pulse 71   Temp 97.7 F (36.5 C) (Axillary)   Resp 16   SpO2 100%   Physical Exam Vitals and nursing note reviewed.  Constitutional:      Appearance: Normal appearance. She is well-developed.  HENT:     Head: Atraumatic.     Nose: Nose normal.     Mouth/Throat:     Mouth: Mucous membranes are moist.  Eyes:     General: No scleral icterus.    Conjunctiva/sclera: Conjunctivae normal.     Pupils: Pupils are equal, round, and reactive to light.  Neck:     Trachea: No tracheal deviation.     Comments: No stiffness or rigidity.  Cardiovascular:     Rate and Rhythm: Normal rate and regular rhythm.     Pulses: Normal pulses.     Heart sounds: Normal heart sounds. No murmur heard. No friction rub. No gallop.   Pulmonary:     Effort: Pulmonary effort is normal. No respiratory distress.     Breath sounds: Normal breath sounds.  Chest:     Chest wall: No tenderness.  Abdominal:     General: Bowel sounds are normal. There is no distension.      Palpations: Abdomen is soft.     Tenderness: There is no abdominal tenderness. There is no guarding.  Genitourinary:    Comments: No cva tenderness.  Musculoskeletal:        General: No swelling or tenderness.     Cervical back: Normal range of motion and neck supple. No rigidity or tenderness. No muscular tenderness.     Comments: CTLS spine, non tender, aligned, no step off. Good passive rom without pain or focal bony tenderness.   Skin:    General: Skin is warm  and dry.     Findings: No rash.  Neurological:     Mental Status: She is alert.     Comments: Alert, content appearing. Does not follow commands. Opens eyes spontaneously. Pts mental status and functional ability reported as c/w baseline. No current active seizure activity noted.   Psychiatric:     Comments: Alert, content.      ED Results / Procedures / Treatments   Labs (all labs ordered are listed, but only abnormal results are displayed) Results for orders placed or performed during the hospital encounter of 12/22/20  CBC  Result Value Ref Range   WBC 7.5 4.0 - 10.5 K/uL   RBC 4.40 3.87 - 5.11 MIL/uL   Hemoglobin 12.6 12.0 - 15.0 g/dL   HCT 81.0 17.5 - 10.2 %   MCV 86.6 80.0 - 100.0 fL   MCH 28.6 26.0 - 34.0 pg   MCHC 33.1 30.0 - 36.0 g/dL   RDW 58.5 (H) 27.7 - 82.4 %   Platelets 211 150 - 400 K/uL   nRBC 0.0 0.0 - 0.2 %  Basic metabolic panel  Result Value Ref Range   Sodium 138 135 - 145 mmol/L   Potassium 3.6 3.5 - 5.1 mmol/L   Chloride 106 98 - 111 mmol/L   CO2 24 22 - 32 mmol/L   Glucose, Bld 130 (H) 70 - 99 mg/dL   BUN 16 8 - 23 mg/dL   Creatinine, Ser 2.35 0.44 - 1.00 mg/dL   Calcium 8.8 (L) 8.9 - 10.3 mg/dL   GFR, Estimated >36 >14 mL/min   Anion gap 8 5 - 15  Valproic acid level  Result Value Ref Range   Valproic Acid Lvl 11 (L) 50.0 - 100.0 ug/mL   DG Chest Port 1 View  Result Date: 12/22/2020 CLINICAL DATA:  Cough, seizures EXAM: PORTABLE CHEST 1 VIEW COMPARISON:  08/22/2015 FINDINGS:  Single frontal view of the chest demonstrates a stable cardiac silhouette. No acute airspace disease, effusion, or pneumothorax. Prominent skin folds overlie the right hemithorax. No acute bony abnormalities. IMPRESSION: 1. No acute intrathoracic process. Electronically Signed   By: Sharlet Salina M.D.   On: 12/22/2020 16:44    EKG None  Radiology No results found.  Procedures Procedures   Medications Ordered in ED Medications - No data to display  ED Course  I have reviewed the triage vital signs and the nursing notes.  Pertinent labs & imaging results that were available during my care of the patient were reviewed by me and considered in my medical decision making (see chart for details).    MDM Rules/Calculators/A&P                         Iv ns. Continuous pulse ox and cardiac monitoring. Stat labs. Cxr.   Reviewed nursing notes and prior charts for additional history.   CXR reviewed/interpreted by me - no pna.   Labs reviewed/interpreted by me - chem normal.   Depakote level low, although on review neurology note appears intentionally on lower dose. Will give dose in ED.   Recheck, pt alert, content, no distress, breathing comfortably. Pt currently appears stable for d/c.   Rec pcp/neurology f/u as outpt.   Return precautions provided.     Final Clinical Impression(s) / ED Diagnoses Final diagnoses:  None    Rx / DC Orders ED Discharge Orders    None       Cathren Laine, MD 12/22/20 1711

## 2020-12-22 NOTE — ED Triage Notes (Signed)
Patient presents to ED by [facility] for seizures.  EMS states that the family called because the patient had a seizure that lasted approximately 3 minutes. Patient has a hx of seizures and takes medication.  States the family is more concerned about chest congestion.  Patient is non-verbal at baseline.

## 2021-01-28 DIAGNOSIS — Z7401 Bed confinement status: Secondary | ICD-10-CM | POA: Diagnosis not present

## 2021-01-28 DIAGNOSIS — F0390 Unspecified dementia without behavioral disturbance: Secondary | ICD-10-CM | POA: Diagnosis not present

## 2021-01-28 DIAGNOSIS — E039 Hypothyroidism, unspecified: Secondary | ICD-10-CM | POA: Diagnosis not present

## 2021-01-28 DIAGNOSIS — M1990 Unspecified osteoarthritis, unspecified site: Secondary | ICD-10-CM | POA: Diagnosis not present

## 2021-02-08 DIAGNOSIS — Z7982 Long term (current) use of aspirin: Secondary | ICD-10-CM | POA: Diagnosis not present

## 2021-02-08 DIAGNOSIS — F0280 Dementia in other diseases classified elsewhere without behavioral disturbance: Secondary | ICD-10-CM | POA: Diagnosis not present

## 2021-02-08 DIAGNOSIS — I10 Essential (primary) hypertension: Secondary | ICD-10-CM | POA: Diagnosis not present

## 2021-02-08 DIAGNOSIS — M1990 Unspecified osteoarthritis, unspecified site: Secondary | ICD-10-CM | POA: Diagnosis not present

## 2021-02-08 DIAGNOSIS — G309 Alzheimer's disease, unspecified: Secondary | ICD-10-CM | POA: Diagnosis not present

## 2021-02-13 DIAGNOSIS — I10 Essential (primary) hypertension: Secondary | ICD-10-CM | POA: Diagnosis not present

## 2021-02-13 DIAGNOSIS — Z7982 Long term (current) use of aspirin: Secondary | ICD-10-CM | POA: Diagnosis not present

## 2021-02-13 DIAGNOSIS — G309 Alzheimer's disease, unspecified: Secondary | ICD-10-CM | POA: Diagnosis not present

## 2021-02-13 DIAGNOSIS — F0280 Dementia in other diseases classified elsewhere without behavioral disturbance: Secondary | ICD-10-CM | POA: Diagnosis not present

## 2021-02-13 DIAGNOSIS — M1990 Unspecified osteoarthritis, unspecified site: Secondary | ICD-10-CM | POA: Diagnosis not present

## 2021-02-21 DIAGNOSIS — Z7982 Long term (current) use of aspirin: Secondary | ICD-10-CM | POA: Diagnosis not present

## 2021-02-21 DIAGNOSIS — G309 Alzheimer's disease, unspecified: Secondary | ICD-10-CM | POA: Diagnosis not present

## 2021-02-21 DIAGNOSIS — I10 Essential (primary) hypertension: Secondary | ICD-10-CM | POA: Diagnosis not present

## 2021-02-21 DIAGNOSIS — F0280 Dementia in other diseases classified elsewhere without behavioral disturbance: Secondary | ICD-10-CM | POA: Diagnosis not present

## 2021-02-21 DIAGNOSIS — M1990 Unspecified osteoarthritis, unspecified site: Secondary | ICD-10-CM | POA: Diagnosis not present

## 2021-03-09 ENCOUNTER — Telehealth: Payer: Self-pay | Admitting: Nurse Practitioner

## 2021-03-09 NOTE — Telephone Encounter (Signed)
Pt is not able to come for a AWV physical impairment

## 2021-04-08 DIAGNOSIS — I10 Essential (primary) hypertension: Secondary | ICD-10-CM | POA: Diagnosis not present

## 2021-04-08 DIAGNOSIS — M1990 Unspecified osteoarthritis, unspecified site: Secondary | ICD-10-CM | POA: Diagnosis not present

## 2021-04-08 DIAGNOSIS — Z7401 Bed confinement status: Secondary | ICD-10-CM | POA: Diagnosis not present

## 2021-04-08 DIAGNOSIS — K5909 Other constipation: Secondary | ICD-10-CM | POA: Diagnosis not present

## 2021-05-09 DIAGNOSIS — G40909 Epilepsy, unspecified, not intractable, without status epilepticus: Secondary | ICD-10-CM | POA: Diagnosis not present

## 2021-05-09 DIAGNOSIS — I10 Essential (primary) hypertension: Secondary | ICD-10-CM | POA: Diagnosis not present

## 2021-05-09 DIAGNOSIS — F0390 Unspecified dementia without behavioral disturbance: Secondary | ICD-10-CM | POA: Diagnosis not present

## 2021-05-09 DIAGNOSIS — Z7401 Bed confinement status: Secondary | ICD-10-CM | POA: Diagnosis not present

## 2021-05-16 ENCOUNTER — Other Ambulatory Visit: Payer: Self-pay

## 2021-05-16 ENCOUNTER — Inpatient Hospital Stay (HOSPITAL_COMMUNITY)
Admission: EM | Admit: 2021-05-16 | Discharge: 2021-05-22 | DRG: 377 | Disposition: A | Discharge status: Hospice | Payer: Medicare Other | Attending: Internal Medicine | Admitting: Internal Medicine

## 2021-05-16 DIAGNOSIS — R579 Shock, unspecified: Secondary | ICD-10-CM

## 2021-05-16 DIAGNOSIS — G9341 Metabolic encephalopathy: Secondary | ICD-10-CM | POA: Diagnosis present

## 2021-05-16 DIAGNOSIS — D890 Polyclonal hypergammaglobulinemia: Secondary | ICD-10-CM | POA: Diagnosis present

## 2021-05-16 DIAGNOSIS — Z20822 Contact with and (suspected) exposure to covid-19: Secondary | ICD-10-CM | POA: Diagnosis present

## 2021-05-16 DIAGNOSIS — E876 Hypokalemia: Secondary | ICD-10-CM | POA: Diagnosis present

## 2021-05-16 DIAGNOSIS — R571 Hypovolemic shock: Secondary | ICD-10-CM | POA: Diagnosis present

## 2021-05-16 DIAGNOSIS — K625 Hemorrhage of anus and rectum: Secondary | ICD-10-CM

## 2021-05-16 DIAGNOSIS — I10 Essential (primary) hypertension: Secondary | ICD-10-CM | POA: Diagnosis present

## 2021-05-16 DIAGNOSIS — Z789 Other specified health status: Secondary | ICD-10-CM

## 2021-05-16 DIAGNOSIS — E870 Hyperosmolality and hypernatremia: Secondary | ICD-10-CM | POA: Diagnosis present

## 2021-05-16 DIAGNOSIS — G40909 Epilepsy, unspecified, not intractable, without status epilepticus: Secondary | ICD-10-CM

## 2021-05-16 DIAGNOSIS — E46 Unspecified protein-calorie malnutrition: Secondary | ICD-10-CM | POA: Diagnosis present

## 2021-05-16 DIAGNOSIS — G309 Alzheimer's disease, unspecified: Secondary | ICD-10-CM | POA: Diagnosis present

## 2021-05-16 DIAGNOSIS — R578 Other shock: Secondary | ICD-10-CM | POA: Diagnosis not present

## 2021-05-16 DIAGNOSIS — Z681 Body mass index (BMI) 19 or less, adult: Secondary | ICD-10-CM | POA: Diagnosis not present

## 2021-05-16 DIAGNOSIS — F0280 Dementia in other diseases classified elsewhere without behavioral disturbance: Secondary | ICD-10-CM | POA: Diagnosis present

## 2021-05-16 DIAGNOSIS — R404 Transient alteration of awareness: Secondary | ICD-10-CM | POA: Diagnosis not present

## 2021-05-16 DIAGNOSIS — M62422 Contracture of muscle, left upper arm: Secondary | ICD-10-CM | POA: Diagnosis present

## 2021-05-16 DIAGNOSIS — M2550 Pain in unspecified joint: Secondary | ICD-10-CM | POA: Diagnosis not present

## 2021-05-16 DIAGNOSIS — K922 Gastrointestinal hemorrhage, unspecified: Principal | ICD-10-CM | POA: Diagnosis present

## 2021-05-16 DIAGNOSIS — J309 Allergic rhinitis, unspecified: Secondary | ICD-10-CM | POA: Diagnosis present

## 2021-05-16 DIAGNOSIS — E440 Moderate protein-calorie malnutrition: Secondary | ICD-10-CM | POA: Diagnosis present

## 2021-05-16 DIAGNOSIS — E878 Other disorders of electrolyte and fluid balance, not elsewhere classified: Secondary | ICD-10-CM | POA: Diagnosis present

## 2021-05-16 DIAGNOSIS — R58 Hemorrhage, not elsewhere classified: Secondary | ICD-10-CM | POA: Diagnosis not present

## 2021-05-16 DIAGNOSIS — R627 Adult failure to thrive: Secondary | ICD-10-CM | POA: Diagnosis present

## 2021-05-16 DIAGNOSIS — Z7982 Long term (current) use of aspirin: Secondary | ICD-10-CM

## 2021-05-16 DIAGNOSIS — E039 Hypothyroidism, unspecified: Secondary | ICD-10-CM | POA: Diagnosis present

## 2021-05-16 DIAGNOSIS — Z66 Do not resuscitate: Secondary | ICD-10-CM | POA: Diagnosis present

## 2021-05-16 DIAGNOSIS — M62421 Contracture of muscle, right upper arm: Secondary | ICD-10-CM | POA: Diagnosis present

## 2021-05-16 DIAGNOSIS — Z87891 Personal history of nicotine dependence: Secondary | ICD-10-CM

## 2021-05-16 DIAGNOSIS — E861 Hypovolemia: Secondary | ICD-10-CM | POA: Diagnosis present

## 2021-05-16 DIAGNOSIS — Z79890 Hormone replacement therapy (postmenopausal): Secondary | ICD-10-CM

## 2021-05-16 DIAGNOSIS — Z79899 Other long term (current) drug therapy: Secondary | ICD-10-CM

## 2021-05-16 DIAGNOSIS — F0390 Unspecified dementia without behavioral disturbance: Secondary | ICD-10-CM

## 2021-05-16 DIAGNOSIS — R000 Tachycardia, unspecified: Secondary | ICD-10-CM | POA: Diagnosis not present

## 2021-05-16 DIAGNOSIS — Z7189 Other specified counseling: Secondary | ICD-10-CM | POA: Diagnosis not present

## 2021-05-16 DIAGNOSIS — R0902 Hypoxemia: Secondary | ICD-10-CM | POA: Diagnosis not present

## 2021-05-16 DIAGNOSIS — Z7401 Bed confinement status: Secondary | ICD-10-CM | POA: Diagnosis not present

## 2021-05-16 DIAGNOSIS — Z515 Encounter for palliative care: Secondary | ICD-10-CM | POA: Diagnosis not present

## 2021-05-16 DIAGNOSIS — Z711 Person with feared health complaint in whom no diagnosis is made: Secondary | ICD-10-CM | POA: Diagnosis not present

## 2021-05-16 DIAGNOSIS — Z8249 Family history of ischemic heart disease and other diseases of the circulatory system: Secondary | ICD-10-CM

## 2021-05-16 LAB — CBC WITH DIFFERENTIAL/PLATELET
Abs Immature Granulocytes: 0.02 10*3/uL (ref 0.00–0.07)
Basophils Absolute: 0 10*3/uL (ref 0.0–0.1)
Basophils Relative: 0 %
Eosinophils Absolute: 0 10*3/uL (ref 0.0–0.5)
Eosinophils Relative: 0 %
HCT: 41.6 % (ref 36.0–46.0)
Hemoglobin: 12.3 g/dL (ref 12.0–15.0)
Immature Granulocytes: 0 %
Lymphocytes Relative: 50 %
Lymphs Abs: 3.2 10*3/uL (ref 0.7–4.0)
MCH: 27.8 pg (ref 26.0–34.0)
MCHC: 29.6 g/dL — ABNORMAL LOW (ref 30.0–36.0)
MCV: 93.9 fL (ref 80.0–100.0)
Monocytes Absolute: 0.3 10*3/uL (ref 0.1–1.0)
Monocytes Relative: 5 %
Neutro Abs: 2.9 10*3/uL (ref 1.7–7.7)
Neutrophils Relative %: 45 %
Platelets: 161 10*3/uL (ref 150–400)
RBC: 4.43 MIL/uL (ref 3.87–5.11)
RDW: 16.8 % — ABNORMAL HIGH (ref 11.5–15.5)
WBC: 6.5 10*3/uL (ref 4.0–10.5)
nRBC: 0.3 % — ABNORMAL HIGH (ref 0.0–0.2)

## 2021-05-16 LAB — COMPREHENSIVE METABOLIC PANEL
ALT: 18 U/L (ref 0–44)
AST: 24 U/L (ref 15–41)
Albumin: 2.4 g/dL — ABNORMAL LOW (ref 3.5–5.0)
Alkaline Phosphatase: 69 U/L (ref 38–126)
Anion gap: 9 (ref 5–15)
BUN: 20 mg/dL (ref 8–23)
CO2: 27 mmol/L (ref 22–32)
Calcium: 8.8 mg/dL — ABNORMAL LOW (ref 8.9–10.3)
Chloride: 121 mmol/L — ABNORMAL HIGH (ref 98–111)
Creatinine, Ser: 0.6 mg/dL (ref 0.44–1.00)
GFR, Estimated: >60 mL/min (ref >60)
Glucose, Bld: 121 mg/dL — ABNORMAL HIGH (ref 70–99)
Potassium: 3.2 mmol/L — ABNORMAL LOW (ref 3.5–5.1)
Sodium: 157 mmol/L — ABNORMAL HIGH (ref 135–145)
Total Bilirubin: 0.8 mg/dL (ref 0.3–1.2)
Total Protein: 6.4 g/dL — ABNORMAL LOW (ref 6.5–8.1)

## 2021-05-16 LAB — ABO/RH: ABO/RH(D): A POS

## 2021-05-16 LAB — PROTIME-INR
INR: 1.3 — ABNORMAL HIGH (ref 0.8–1.2)
Prothrombin Time: 16.3 seconds — ABNORMAL HIGH (ref 11.4–15.2)

## 2021-05-16 LAB — I-STAT CHEM 8, ED
BUN: 20 mg/dL (ref 8–23)
Calcium, Ion: 1.06 mmol/L — ABNORMAL LOW (ref 1.15–1.40)
Chloride: 122 mmol/L — ABNORMAL HIGH (ref 98–111)
Creatinine, Ser: 0.4 mg/dL — ABNORMAL LOW (ref 0.44–1.00)
Glucose, Bld: 130 mg/dL — ABNORMAL HIGH (ref 70–99)
HCT: 40 % (ref 36.0–46.0)
Hemoglobin: 13.6 g/dL (ref 12.0–15.0)
Potassium: 3.3 mmol/L — ABNORMAL LOW (ref 3.5–5.1)
Sodium: 160 mmol/L — ABNORMAL HIGH (ref 135–145)
TCO2: 28 mmol/L (ref 22–32)

## 2021-05-16 LAB — BASIC METABOLIC PANEL
Anion gap: 6 (ref 5–15)
BUN: 19 mg/dL (ref 8–23)
CO2: 26 mmol/L (ref 22–32)
Calcium: 8.2 mg/dL — ABNORMAL LOW (ref 8.9–10.3)
Chloride: 122 mmol/L — ABNORMAL HIGH (ref 98–111)
Creatinine, Ser: 0.48 mg/dL (ref 0.44–1.00)
GFR, Estimated: >60 mL/min (ref >60)
Glucose, Bld: 87 mg/dL (ref 70–99)
Potassium: 3.8 mmol/L (ref 3.5–5.1)
Sodium: 154 mmol/L — ABNORMAL HIGH (ref 135–145)

## 2021-05-16 LAB — RESP PANEL BY RT-PCR (FLU A&B, COVID) ARPGX2
Influenza A by PCR: NEGATIVE
Influenza B by PCR: NEGATIVE
SARS Coronavirus 2 by RT PCR: NEGATIVE

## 2021-05-16 LAB — HEMOGLOBIN AND HEMATOCRIT, BLOOD
HCT: 46.9 % — ABNORMAL HIGH (ref 36.0–46.0)
Hemoglobin: 14.8 g/dL (ref 12.0–15.0)

## 2021-05-16 LAB — LACTIC ACID, PLASMA: Lactic Acid, Venous: 2.8 mmol/L (ref 0.5–1.9)

## 2021-05-16 MED ORDER — PANTOPRAZOLE SODIUM 40 MG IV SOLR
40.0000 mg | Freq: Once | INTRAVENOUS | Status: AC
  Administered 2021-05-16: 40 mg via INTRAVENOUS
  Filled 2021-05-16: qty 40

## 2021-05-16 MED ORDER — SODIUM CHLORIDE 0.9 % IV BOLUS
500.0000 mL | Freq: Once | INTRAVENOUS | Status: AC
  Administered 2021-05-16: 500 mL via INTRAVENOUS

## 2021-05-16 MED ORDER — PANTOPRAZOLE SODIUM 40 MG IV SOLR
40.0000 mg | Freq: Two times a day (BID) | INTRAVENOUS | Status: DC
  Administered 2021-05-16 – 2021-05-22 (×12): 40 mg via INTRAVENOUS
  Filled 2021-05-16 (×13): qty 40

## 2021-05-16 MED ORDER — VALPROATE SODIUM 100 MG/ML IV SOLN
125.0000 mg | Freq: Two times a day (BID) | INTRAVENOUS | Status: DC
  Administered 2021-05-16 – 2021-05-20 (×8): 125 mg via INTRAVENOUS
  Filled 2021-05-16 (×11): qty 1.25

## 2021-05-16 MED ORDER — ONDANSETRON HCL 4 MG/2ML IJ SOLN: 4.0000 mg | Freq: Four times a day (QID) | INTRAMUSCULAR | Status: DC | PRN

## 2021-05-16 MED ORDER — SODIUM CHLORIDE 0.45 % IV SOLN: INTRAVENOUS | Status: DC

## 2021-05-16 MED ORDER — POTASSIUM CHLORIDE 10 MEQ/100ML IV SOLN
10.0000 meq | INTRAVENOUS | Status: AC
  Administered 2021-05-16 (×4): 10 meq via INTRAVENOUS
  Filled 2021-05-16 (×4): qty 100

## 2021-05-16 MED ORDER — ONDANSETRON HCL 4 MG PO TABS: 4.0000 mg | ORAL_TABLET | Freq: Four times a day (QID) | ORAL | Status: DC | PRN

## 2021-05-16 MED ORDER — LEVOTHYROXINE SODIUM 100 MCG/5ML IV SOLN
37.5000 ug | Freq: Every day | INTRAVENOUS | Status: DC
  Filled 2021-05-16: qty 5

## 2021-05-16 NOTE — ED Provider Notes (Signed)
[facility] EMERGENCY DEPARTMENT Provider Note   CSN: [id number] Arrival date & time: 05/16/21  1441     History No chief complaint on file.   Kristi Webb is a 87 y.o. female.  The history is provided by the patient and medical records. No language interpreter was used.  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   SDH (subdural hematoma) (HCC)    b/l 11/2011 at [facility] placed on keppra 250 mg bid    Seizure (HCC)    Thrush    Thyroid disease     Patient Active Problem List   Diagnosis Date Noted   Allergic rhinitis 10/30/2019   Seizure disorder (HCC) 10/30/2019   Complex partial seizure with impairment of consciousness at onset [facility]) 04/17/2018   Hypokalemia 02/04/2014   Aspiration pneumonia (HCC) 12/24/2013   FUO (fever of unknown origin) 12/23/2013   Personal history of tobacco use, presenting hazards to health 04/29/2013   Shoulder joint pain 01/30/2013   Disorder of bone and cartilage 12/01/2012   Hx traumatic fracture 12/01/2012   Problems influencing health status 12/01/2012   Other specified diseases of blood and blood-forming organs 09/03/2012   Aspiration pneumonitis (HCC) 07/06/2012   Malnutrition (HCC) 05/29/2012   Acute hypernatremia 05/28/2012   Leukocytosis 05/28/2012   Sacral decubitus ulcer, stage II (HCC) 05/28/2012   Thyroid disorder 05/28/2012   Thrush 05/28/2012   FTT (failure to thrive) in adult 05/28/2012   Tachypnea 05/28/2012   Tachycardia 05/28/2012   Status post craniotomy 05/19/2012   Altered mental status 12/27/2011   Dysphagia 12/27/2011    Pressure ulcer, stage 2 (HCC) 12/05/2011   Alzheimer's dementia without behavioral disturbance (HCC) 11/29/2011   Hypothyroidism 11/29/2011   Essential hypertension 11/29/2011   Subdural hematoma (HCC) 11/29/2011   Constipation 09/14/2011   Heartburn 07/06/2011    Past Surgical History:  Procedure Laterality Date   OTHER SURGICAL HISTORY  04/21/2011   left hip internal fixation and reduction intertrochanteric and femoral neck   OTHER SURGICAL HISTORY  ~2 yrs ago   brain surgery     OB History   No obstetric history on file.     Family History  Problem Relation Age of Onset   Hypertension Other        daughter 1(living)   Suicidality Other        daughter 2   Seizures Neg Hx     Social History   Tobacco Use   Smoking status: Former    Packs/day: 1.00    Years: 40.00    Pack years: 40.00    Types: Cigarettes   Smokeless tobacco: Never  Vaping Use   Vaping Use: Never used  Substance Use Topics   Alcohol use: No   Drug use: Never    Home Medications Prior to Admission medications   Medication Sig Start Date End Date Taking? Authorizing Provider  acetaminophen (TYLENOL) 500 MG tablet Take 500-1,000 mg by mouth every 6 (six) hours as needed for mild pain, moderate pain or fever.    [provider]  Ascorbic Acid (VITAMIN C) 1000 MG tablet Take 1,000 mg by mouth daily.    [provider]  aspirin 81 MG tablet Take 81 mg by mouth daily.    [provider]  CALCIUM PO Take 600 mg by mouth daily.    [provider]  cetirizine (ZYRTEC) 10 MG tablet Take 1 tablet (10 mg total) by mouth daily. 07/27/20 07/27/21  Barbette MerinoKing, Crystal M, NP  Cholecalciferol (VITAMIN D3) 10 MCG (400 UNIT) CAPS Take by mouth.    [provider]  divalproex (DEPAKOTE SPRINKLES) 125 MG capsule Take 1 capsule (125 mg total) by mouth 2 (two) times daily. 05/09/20   Barbette MerinoKing, Crystal M, NP  donepezil (ARICEPT) 10 MG tablet Take 1 tablet (10 mg total) by mouth at  bedtime. 10/27/20   Barbette MerinoKing, Crystal M, NP  levothyroxine (SYNTHROID) 75 MCG tablet Take 1 tablet (75 mcg total) by mouth daily. 10/27/20 10/27/21  Barbette MerinoKing, Crystal M, NP  memantine (NAMENDA) 5 MG tablet Take 1 tablet (5 mg total) by mouth daily. 10/27/20   Barbette MerinoKing, Crystal M, NP  Multiple Vitamins-Minerals (MULTIVITAMIN PO) Take by mouth. Silver multivitamin    [provider]    Allergies    Patient has no known allergies.  Review of Systems   Review of Systems  Unable to perform ROS: Mental status change (Minimally responsive with severe dementia.)  Gastrointestinal:  Positive for hematochezia. Negative for abdominal pain.   Physical Exam Updated Vital Signs BP (!) 149/82   Pulse 73   Resp 14   SpO2 100%   Physical Exam Vitals and nursing note reviewed.  Constitutional:      General: She is in acute distress.     Appearance: She is well-developed. She is ill-appearing.  HENT:     Head: Normocephalic and atraumatic.     Nose: No congestion.     Mouth/Throat:     Mouth: Mucous membranes are dry.     Pharynx: No oropharyngeal exudate or posterior oropharyngeal erythema.  Eyes:     Extraocular Movements: Extraocular movements intact.     Conjunctiva/sclera: Conjunctivae normal.     Pupils: Pupils are equal, round, and reactive to light.  Cardiovascular:     Rate and Rhythm: Normal rate and regular rhythm.     Heart sounds: No murmur heard. Pulmonary:     Effort: Pulmonary effort is normal. No respiratory distress.     Breath sounds: Rhonchi present. No wheezing or rales.  Chest:     Chest wall: No tenderness.  Abdominal:     General: Abdomen is flat.     Palpations: Abdomen is soft.     Tenderness: There is no abdominal tenderness. There is no guarding or rebound.  Musculoskeletal:        General: No tenderness.     Cervical back: No tenderness.  Skin:    General: Skin is warm and dry.     Coloration: Skin is pale.  Neurological:     GCS: GCS eye subscore is 4. GCS  verbal subscore is 1. GCS motor subscore is 1.     Comments: Patient is somnolent initially but was able to be aroused by loud yelling and then would follow some commands with her face.  Would not move extremities.  Per family other than the more fatigue and sleepiness she seems to be at her baseline mental status from an extremity standpoint    ED Results / Procedures / Treatments   Labs (all labs ordered are listed,  but only abnormal results are displayed) Labs Reviewed  COMPREHENSIVE METABOLIC PANEL - Abnormal; Notable for the following components:      Result Value   Sodium 157 (*)    Potassium 3.2 (*)    Chloride 121 (*)    Glucose, Bld 121 (*)    Calcium 8.8 (*)    Total Protein 6.4 (*)    Albumin 2.4 (*)    All other components within normal limits  CBC WITH DIFFERENTIAL/PLATELET - Abnormal; Notable for the following components:   MCHC 29.6 (*)    RDW 16.8 (*)    nRBC 0.3 (*)    All other components within normal limits  LACTIC ACID, PLASMA - Abnormal; Notable for the following components:   Lactic Acid, Venous 2.8 (*)    All other components within normal limits  PROTIME-INR - Abnormal; Notable for the following components:   Prothrombin Time 16.3 (*)    INR 1.3 (*)    All other components within normal limits  I-STAT CHEM 8, ED - Abnormal; Notable for the following components:   Sodium 160 (*)    Potassium 3.3 (*)    Chloride 122 (*)    Creatinine, Ser 0.40 (*)    Glucose, Bld 130 (*)    Calcium, Ion 1.06 (*)    All other components within normal limits  RESP PANEL BY RT-PCR (FLU A&B, COVID) [id number]  LACTIC ACID, PLASMA  TSH  TYPE AND SCREEN  ABO/RH    EKG EKG Interpretation  Date/Time:  Tuesday May 16 2021 15:03:07 EDT Ventricular Rate:  110 PR Interval:  127 QRS Duration: 93 QT Interval:  373 QTC Calculation: 505 R Axis:   210 Text Interpretation: Right and left arm electrode reversal, interpretation assumes no reversal Sinus tachycardia Right  axis deviation Abnormal lateral Q waves Minimal ST depression, anterolateral leads Prolonged QT interval When compared to prior, faster rate and longer QTc. No sTEMI Confirmed by Theda Belfast ([id number]) on 05/16/2021 3:21:42 PM  Radiology No results found.  Procedures Procedures   CRITICAL CARE Performed by: Canary Brim Rosemary Pentecost Total critical care time: 45 minutes Critical care time was exclusive of separately billable procedures and treating other patients. Critical care was necessary to treat or prevent imminent or life-threatening deterioration. Critical care was time spent personally by me on the following activities: development of treatment plan with patient and/or surrogate as well as nursing, discussions with consultants, evaluation of patient's response to treatment, examination of patient, obtaining history from patient or surrogate, ordering and performing treatments and interventions, ordering and review of laboratory studies, ordering and review of radiographic studies, pulse oximetry and re-evaluation of patient's condition.   Medications Ordered in ED Medications  pantoprazole (PROTONIX) injection 40 mg (has no administration in time range)  0.45 % sodium chloride infusion ( Intravenous New Bag/Given 05/16/21 1814)  potassium chloride 10 mEq in 100 mL IVPB (10 mEq Intravenous New Bag/Given 05/16/21 1814)  pantoprazole (PROTONIX) injection 40 mg (40 mg Intravenous Given 05/16/21 1555)  sodium chloride 0.9 % bolus 500 mL (0 mLs Intravenous Stopped 05/16/21 1600)    ED Course  I have reviewed the triage vital signs and the nursing notes.  Pertinent labs & imaging results that were available during my care of the patient were reviewed by me and considered in my medical decision making (see chart for details).    MDM Rules/Calculators/A&P  Chelci SAIDY ORMAND is a 87 y.o. female with a past medical history significant for severe Alzheimer's dementia,  hypertension, thyroid disease, seizures, prior subdural hematoma who presents with rectal hemorrhage, hypotension, and altered mental status.  Patient was seen briefly by a screening provider who was able to initiate care.  Patient on arrival was found to have a large amount of bright rectal bleeding and was found to be hypotensive with a systolic blood pressure in the 40s and 50s consistently.  Patient was altered and had waxing and waning alertness.  Patient quickly was transfused with emergency release blood and started on fluids.  Screening blood work was ordered.  On my first evaluation, patient was already receiving blood and fluids.  Her blood pressure had started to improve around 100 systolic.  On my evaluation, patient was able to follow commands by moving her eyes but would not answer any questions.  Family subsequently confirmed that she is nonverbal and does not move her extremities much at baseline.  Daughter was able to speak to me on the phone and arrived and was able to report that at about 1 PM, patient's great granddaughter reported that the patient started having bright rectal bleeding and was getting sleepy.  Patient was subsequently brought in with the rectal hemorrhage and hypotension.  Per the chart and discussion with family, patient is DNR/DNI however there was some reported discussion about if this should continue.  On the phone we decided to give blood and fluids and contact GI and critical care and have a shared decision-making conversation about management.  After family arrived, patient had received 2 units of emergency release blood and fluids and blood pressure had improved.  Critical care and GI had a discussion with the family and everyone agreed to have her fully DNR/DNI but continue to give fluid and blood as needed to see if her body will be able to handle this blood loss.  Her hemoglobin surprisingly returned normal although with acute bleeding the hemoglobin does not  necessarily drop quickly.  Anticipate trending of her hemoglobin.  The critical care team feels that as she is now DNR/DNI and would not be having aggressive measures performed, she is appropriate for a medicine admit to the progressive unit.  Medicine team called who will admit.  Family is on board with management.  We will continue the rest of her work-up and hydration as she is very hypernatremic.      She will be by medicine for further management.   Final Clinical Impression(s) / ED Diagnoses Final diagnoses:  Hemorrhagic shock (HCC)  Rectal bleed     Clinical Impression: 1. Hemorrhagic shock (HCC)   2. Rectal bleed     Disposition: Admit  This note was prepared with assistance of Dragon voice recognition software. Occasional wrong-word or sound-a-like substitutions may have occurred due to the inherent limitations of voice recognition software.     Aya Geisel, Canary Brim, MD 05/16/21 [phone]

## 2021-05-16 NOTE — H&P (Signed)
History and Physical    DOA: 05/16/2021  PCP: [name], NP  Patient coming from: Home  Chief Complaint: Rectal bleeding  HPI: Kristi Webb is a 87 y.o. female with advanced Alzheimer's dementia, hypertension, thyroid disease, seizures, prior subdural hematoma brought into the ED by family and concern for rectal hemorrhage since 1 PM today.Per ED physician family brought in a "bucket full of blood".  On initial ED evaluation patient severely hypotensive with systolic 40s associated with lethargy.  Patient was found to have a large amount of bright rectal bleeding and had waxing and waning alertness.  Patient quickly was transfused with 2 units of emergency release blood and started on fluid boluses. GI and critical care were consulted.  ED physician, PCCM and GI had discussions with family regarding critical condition, risks of anesthesia, prognosis and CODE STATUS.  Patient was DNR/DNI prior to presentation and family chose to continue the same.  Fortunately her blood pressure did improve with blood and fluid resuscitation.  Family would like to continue IV fluids and nonaggressive modes of treatment as patient has shown improvement at this time understanding risk of deterioration during the hospital stay.  Lab work revealed WBC 6.5, hemoglobin 12.3, sodium 157-repeat level at 160, potassium 3.2-3.3, BUN 20, creatinine 0.6, INR 1.3, lactate 2.8.  Review of Systems: As per HPI, otherwise review of systems negative. Non verbal at baseline, eats with assisted feeding at home. Poor liquid intake. 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 Her smoking use included cigarettes. She has a 40.00 pack-year smoking history. She has never used smokeless tobacco. She reports that she does not drink alcohol and does not use drugs.   No Known Allergies  Family History  Problem Relation Age of Onset   Hypertension Other        daughter 1(living)   Suicidality Other        daughter 2   Seizures Neg Hx       Prior to Admission medications   Medication Sig Start Date End Date Taking? Authorizing Provider  acetaminophen (TYLENOL) 500 MG tablet Take 500-1,000 mg by mouth every 6 (six) hours as needed for mild pain, moderate pain or fever.    [provider]  Ascorbic Acid (VITAMIN C) 1000 MG tablet Take 1,000 mg by mouth daily.    [provider]  aspirin 81 MG tablet Take 81 mg by mouth daily.    [provider]  CALCIUM PO Take 600 mg by mouth daily.    [provider]  cetirizine (ZYRTEC) 10 MG tablet Take 1 tablet (10 mg total) by mouth daily. 07/27/20 07/27/21  [name], NP  Cholecalciferol (VITAMIN D3) 10 MCG (400 UNIT) CAPS Take 10 mcg by mouth daily.    [provider]  divalproex (  DEPAKOTE SPRINKLES) 125 MG capsule Take 1 capsule (125 mg total) by mouth 2 (two) times daily. 05/09/20   Barbette MerinoKing, Crystal M, NP  donepezil (ARICEPT) 10 MG tablet Take 1 tablet (10 mg total) by mouth at bedtime. 10/27/20   Barbette MerinoKing, Crystal M, NP  levothyroxine (SYNTHROID) 75 MCG tablet Take 1 tablet (75 mcg total) by mouth daily. 10/27/20 10/27/21  Barbette MerinoKing, Crystal M, NP  memantine (NAMENDA) 5 MG tablet Take 1 tablet (5 mg total) by mouth daily. 10/27/20   Barbette MerinoKing, Crystal M, NP  Multiple Vitamins-Minerals (MULTIVITAMIN PO) Take by mouth. Silver multivitamin    [provider]    Physical Exam: Vitals:    05/16/21 1800 05/16/21 1830 05/16/21 1900 05/16/21 1943  BP: (!) 151/70 (!) 148/81 (!) 149/82   Pulse: 72 70 73   Resp: 11 10 14    Temp:    (!) 97.3 F (36.3 C)  TempSrc:    Tympanic  SpO2: 100% 100% 100%     Constitutional: elderly female with dementia/cachexia and contractures in no apparent distress ENMT: Mucous membranes are dry, mouth breathing Neck: normal, supple, no masses, no thyromegaly Respiratory: clear to auscultation bilaterally, no wheezing, no crackles. Normal respiratory effort. No accessory muscle use.  Cardiovascular: Regular rate and rhythm,tachycardic , no murmurs / rubs / gallops. No extremity edema. 2+ pedal pulses. No carotid bruits.  Abdomen: no tenderness, no masses palpated. No hepatosplenomegaly. Bowel sounds positive.  Musculoskeletal: contractures in multiple extremities, IV access through RLE  Neurologic: lethargic, non verbal (baseline) cannot determine further strength due to contractures. Psychiatric: unable to assess SKIN/catheters: no decubiti Labs on Admission: I have personally reviewed following labs and imaging studies  CBC: Recent Labs  Lab 05/16/21 1513 05/16/21 1556  WBC 6.5  --   NEUTROABS 2.9  --   HGB 12.3 13.6  HCT 41.6 40.0  MCV 93.9  --   PLT 161  --    Basic Metabolic Panel: Recent Labs  Lab 05/16/21 1513 05/16/21 1556  NA 157* 160*  K 3.2* 3.3*  CL 121* 122*  CO2 27  --   GLUCOSE 121* 130*  BUN 20 20  CREATININE 0.60 0.40*  CALCIUM 8.8*  --    GFR: CrCl cannot be calculated (Unknown ideal weight.). Recent Labs  Lab 05/16/21 1513 05/16/21 1600  WBC 6.5  --   LATICACIDVEN  --  2.8*   Liver Function Tests: Recent Labs  Lab 05/16/21 1513  AST 24  ALT 18  ALKPHOS 69  BILITOT 0.8  PROT 6.4*  ALBUMIN 2.4*   No results for input(s): LIPASE, AMYLASE in the last 168 hours. No results for input(s): AMMONIA in the last 168 hours. Coagulation Profile: Recent Labs  Lab 05/16/21 1600  INR 1.3*   Cardiac  Enzymes: No results for input(s): CKTOTAL, CKMB, CKMBINDEX, TROPONINI in the last 168 hours. BNP (last 3 results) No results for input(s): PROBNP in the last 8760 hours. HbA1C: No results for input(s): HGBA1C in the last 72 hours. CBG: No results for input(s): GLUCAP in the last 168 hours. Lipid Profile: No results for input(s): CHOL, HDL, LDLCALC, TRIG, CHOLHDL, LDLDIRECT in the last 72 hours. Thyroid Function Tests: No results for input(s): TSH, T4TOTAL, FREET4, T3FREE, THYROIDAB in the last 72 hours. Anemia Panel: No results for input(s): VITAMINB12, FOLATE, FERRITIN, TIBC, IRON, RETICCTPCT in the last 72 hours. Urine analysis:    Component Value Date/Time   COLORURINE AMBER (A) 05/16/2016 1720   APPEARANCEUR CLOUDY (A) 05/16/2016 1720  LABSPEC 1.033 (H) 05/16/2016 1720   PHURINE 5.0 05/16/2016 1720   GLUCOSEU NEGATIVE 05/16/2016 1720   HGBUR NEGATIVE 05/16/2016 1720   BILIRUBINUR NEGATIVE 05/16/2016 1720   KETONESUR NEGATIVE 05/16/2016 1720   PROTEINUR NEGATIVE 05/16/2016 1720   UROBILINOGEN 0.2 11/10/2014 1757   NITRITE NEGATIVE 05/16/2016 1720   LEUKOCYTESUR NEGATIVE 05/16/2016 1720    Radiological Exams on Admission: Personally reviewed  No results found.  EKG: Independently reviewed.  Sinus tachycardia, right axis deviation, nonsignificant (less than 1 mm) ST depression in anterolateral leads, QTC prolonged at 505 ms.     Assessment and Plan:   Principal Problem:   Shock circulatory (HCC) Active Problems:   Lower GI bleed   Acute hypernatremia   Alzheimer's dementia without behavioral disturbance (HCC)   Hypothyroidism   Essential hypertension   Seizure disorder (HCC)    1.  Hemorrhagic shock: Received 2 units of PRBC on an emergent basis on arrival due to hemorrhage/hypotension.  Also received fluid resuscitation and blood pressure now improved to systolic 140s.  Continue to monitor.  Family understands the risk of recurrence but would like to give her a  chance of recovery with conservative management. Appreciate PCCM evaluation and recommendations.  2.  Lower GI bleed: Likely diverticular.  Seen by GI high risk for endoscopy.  Conservative management for now.  N.p.o., IV fluids, PRBC transfusion as needed.  Received 2 units emergent PRBC in the setting of hemorrhage. Appreciate GI evaluation and management.  3.  Hypernatremia: Likely due to volume depletion.  Continue half-normal saline at 125 mill per hour for now as advised by PCCM.  Repeat labs tonight and in a.m.  If persistently elevated or worsening, can transition to hypotonic fluids like D5 water.  4.  Advanced dementia with acute metabolic encephalopathy in the setting of problem #1 and problem #2: Blood pressure now improved and her mental status is improving as well.  Hold Aricept, Namenda for now.  5.  Prolonged QTC: Hold QT prolonging agents.  Blood pressure now improved and fluid resuscitated.  Replace potassium.  Check mag, Phos and replace as needed.  Can repeat EKG in a.m. to follow-up.  6.  Hypothyroidism: Will change Synthroid to IV until able to tolerate p.o.  7.  Seizure disorder: Per problem list patient has history of complex partial seizure.  Patient on Depakote.  Will transition to IV with pharmacy consult if unable to tolerate p.o.  8. Goals of care: Family understand risks of deterioration and fatal consequences.  Given improvement with fluid and blood resuscitation, they would however like to continue current level of care with conservative interventions.  Will request palliative care consult for further discussions in AM.  DVT prophylaxis: SCDs given problem #2  COVID screen: negative  Code Status: DNR/DNI  Patient/Family Communication: Discussed with daughter/grand-daughter and all questions answered to satisfaction.  Consults called: GI, PCCM Admission status :I certify that at the point of admission it is my clinical judgment that the patient will require  inpatient hospital care spanning beyond 2 midnights from the point of admission due to high intensity of service and high frequency of surveillance required.Inpatient status is judged to be reasonable and necessary in order to provide the required intensity of service to ensure the patient's safety. The patient's presenting symptoms, physical exam findings, and initial radiographic and laboratory data in the context of their chronic comorbidities is felt to place them at high risk for further clinical deterioration. The following factors support the patient status of  inpatient : Hemorrhagic shock requiring emergent PRBC and aggressive fluid resuscitation.  High risk of deterioration.     Alessandra Bevels MD [facility] Pager in [place]  If 7PM-7AM, please contact night-coverage [email or web address]   05/16/2021, 7:43 PM

## 2021-05-16 NOTE — ED Notes (Signed)
I attempted to straight stick pt for repeat lactic acid 3 times without success. Another staff member to try.

## 2021-05-16 NOTE — ED Notes (Signed)
GI called daughter per her request.

## 2021-05-16 NOTE — ED Notes (Signed)
Admitting DO at bedside. Aware of pt's lactic acid. No new orders (other than redraw).

## 2021-05-16 NOTE — ED Notes (Signed)
One emergent unit of PRBC ordered by MD Tegeler

## 2021-05-16 NOTE — H&P (Addendum)
NAME:  Kristi Webb, MRN:  [id number], DOB:  September 03, 1932, LOS: 0 ADMISSION DATE:  05/16/2021, CONSULTATION DATE:  8/16 REFERRING MD:  Tegeler, CHIEF COMPLAINT:  hemorrhagic shock    History of Present Illness:  87 year old female w/ multiple co-morbids. Presented via EMS from home. At baseline dependent for ALL ADLs secondary to advanced dementia. Family noted new onset of frank blood in stool so called EMS. On EMS arrival SBP 40s. IV access obtained. Emergent O neg blood transfused on arrival to ER w/ noted improvement in BP. PCCM asked to evaluate   Pertinent  Medical History  Advanced dementia, seizure do, non-verbal at baseline, bedbound and unable to care for self > 10 years. Polyclonal gammopathy, prior SDH, failure to thrive. Thyroid disease, prior aspiration PNA, dependent for all ADLs, prior sacral decubs, remote smoker   Significant Hospital Events: Including procedures, antibiotic start and stop dates in addition to other pertinent events   8/16 admitted for acute GIB. Got 2 units emergent blood. Spoke w/ daughter Lessie Dings. Plan to continue conservative care, transfuse, PPI. No pressors, no invasive procedures.   Interim History / Subjective:  Does not appear to be in distress.   Objective   Blood pressure (Abnormal) 129/51, pulse 98, resp. rate 16, SpO2 100 %.       No intake or output data in the 24 hours ending 05/16/21 1525 There were no vitals filed for this visit.  Examination: General: frail cachectic 87 year old female. She is Non-verbal. Contracted. Awake but not in distress.  HENT: temporal wasting. MM are dry. Sclera dry. Neck veins flat.  Lungs: diminished. No accessory use  Cardiovascular: RRR  Abdomen: soft not tender  Extremities: contracted and dry  Neuro: awake. Not verbal GU: due to void   Resolved Hospital Problem list    Assessment & Plan:  Hemorrhagic shock 2/2 GIB (POA)-->now s/p 2 u emergent release blood and volume responsive Plan Serial  cbcs IV PPI  Can see what next cbc is. For now transfuse as indicated for hypotension.  No endoscopy  Bowel rest   Fluid and electrolyte imbalance: severe dehydration, hypernatremia, hypokalemia,  -free water def 3.2 liters.  Plan  Start 1/2 NS at 125cc/hr  Serial chems   History of Advanced dementia w/ documented Failure to Thrive, immobility Plan Supportive care  H/o seizure d/o Plan Home meds   Protein calorie malnutrition (POA) Plan Nutritional consult to be considered  History of Hypothyroidism Plan Replace   H/o polyclonal gammopathy Plan NTD     Best Practice (right click and "Reselect all SmartList Selections" daily)   Diet/type: NPO DVT prophylaxis: SCD GI prophylaxis: PPI Lines: N/A Foley:  N/A Code Status:  DNR Last date of multidisciplinary goals of care discussion [discussed w/ family PCCM s/o call PRN]  Labs   CBC: No results for input(s): WBC, NEUTROABS, HGB, HCT, MCV, PLT in the last 168 hours.  Basic Metabolic Panel: No results for input(s): NA, K, CL, CO2, GLUCOSE, BUN, CREATININE, CALCIUM, MG, PHOS in the last 168 hours. GFR: CrCl cannot be calculated (Patient's most recent lab result is older than the maximum 21 days allowed.). No results for input(s): PROCALCITON, WBC, LATICACIDVEN in the last 168 hours.  Liver Function Tests: No results for input(s): AST, ALT, ALKPHOS, BILITOT, PROT, ALBUMIN in the last 168 hours. No results for input(s): LIPASE, AMYLASE in the last 168 hours. No results for input(s): AMMONIA in the last 168 hours.  ABG    Component Value  Date/Time   PHART 7.439 05/28/2012 1651   PCO2ART 33.7 (L) 05/28/2012 1651   PO2ART 76.0 (L) 05/28/2012 1651   HCO3 22.9 05/28/2012 1651   TCO2 24 05/28/2012 1651   ACIDBASEDEF 1.0 05/28/2012 1651   O2SAT 96.0 05/28/2012 1651     Coagulation Profile: No results for input(s): INR, PROTIME in the last 168 hours.  Cardiac Enzymes: No results for input(s): CKTOTAL, CKMB,  CKMBINDEX, TROPONINI in the last 168 hours.  HbA1C: Hgb A1c MFr Bld  Date/Time Value Ref Range Status  05/28/2012 05:02 PM 5.7 (H) <5.7 % Final    Comment:    (NOTE)                                                                       According to the ADA Clinical Practice Recommendations for 2011, when HbA1c is used as a screening test:  >=6.5%   Diagnostic of Diabetes Mellitus           (if abnormal result is confirmed) 5.7-6.4%   Increased risk of developing Diabetes Mellitus References:Diagnosis and Classification of Diabetes Mellitus,Diabetes Care,2011,34(Suppl 1):S62-S69 and Standards of Medical Care in         Diabetes - 2011,Diabetes Care,2011,34 (Suppl 1):S11-S61.  04/22/2011 05:40 AM 5.6 <5.7 % Final    Comment:    (NOTE)                                                                       According to the ADA Clinical Practice Recommendations for 2011, when HbA1c is used as a screening test:  >=6.5%   Diagnostic of Diabetes Mellitus           (if abnormal result is confirmed) 5.7-6.4%   Increased risk of developing Diabetes Mellitus References:Diagnosis and Classification of Diabetes Mellitus,Diabetes Care,2011,34(Suppl 1):S62-S69 and Standards of Medical Care in         Diabetes - 2011,Diabetes Care,2011,34 (Suppl 1):S11-S61.    CBG: No results for input(s): GLUCAP in the last 168 hours.  Review of Systems:   Not able   Past Medical History:  She,  has a past medical history of Dementia (HCC), FTT (failure to thrive) in adult, Polyclonal gammopathy, SDH (subdural hematoma) (HCC), Seizure (HCC), Thrush, and Thyroid disease.   Surgical History:   Past Surgical History:  Procedure Laterality Date   OTHER SURGICAL HISTORY  04/21/2011   left hip internal fixation and reduction intertrochanteric and femoral neck   OTHER SURGICAL HISTORY  ~2 yrs ago   brain surgery     Social History:   reports that she has quit smoking. Her smoking use included cigarettes.  She has a 40.00 pack-year smoking history. She has never used smokeless tobacco. She reports that she does not drink alcohol and does not use drugs.   Family History:  Her family history includes Hypertension in an other family member; Suicidality in an other family member. There is no history of Seizures.   Allergies No Known Allergies  Home Medications  Prior to Admission medications   Medication Sig Start Date End Date Taking? Authorizing Provider  acetaminophen (TYLENOL) 500 MG tablet Take 500-1,000 mg by mouth every 6 (six) hours as needed for mild pain, moderate pain or fever.    [provider]  Ascorbic Acid (VITAMIN C) 1000 MG tablet Take 1,000 mg by mouth daily.    [provider]  aspirin 81 MG tablet Take 81 mg by mouth daily.    [provider]  CALCIUM PO Take 600 mg by mouth daily.    [provider]  cetirizine (ZYRTEC) 10 MG tablet Take 1 tablet (10 mg total) by mouth daily. 07/27/20 07/27/21  [name], NP  Cholecalciferol (VITAMIN D3) 10 MCG (400 UNIT) CAPS Take 10 mcg by mouth daily.    [provider]  divalproex (DEPAKOTE SPRINKLES) 125 MG capsule Take 1 capsule (125 mg total) by mouth 2 (two) times daily. 05/09/20   [name], NP  donepezil (ARICEPT) 10 MG tablet Take 1 tablet (10 mg total) by mouth at bedtime. 10/27/20   [name], NP  levothyroxine (SYNTHROID) 75 MCG tablet Take 1 tablet (75 mcg total) by mouth daily. 10/27/20 10/27/21  [name], NP  memantine (NAMENDA) 5 MG tablet Take 1 tablet (5 mg total) by mouth daily. 10/27/20   [name], NP  Multiple Vitamins-Minerals (MULTIVITAMIN PO) Take by mouth. Silver multivitamin    [provider]     Critical care time: NA    Simonne Martinet ACNP-BC [facility] Pulmonary/Critical Care Pager # [phone] OR # [phone] if no answer

## 2021-05-16 NOTE — ED Notes (Signed)
Pt's diaper changed again. Had a small amount of bloody stool now. Peri-care completed. Pt readjusted in bed.

## 2021-05-16 NOTE — ED Triage Notes (Signed)
Pt bib [facility] from home with daughter and granddaughter with complaints of GI bleed. First noticed bleeding this afternoon with brief saturated with bright red blood. Pt is non verbal and contracted at baseline. EMS vitals: 112/76, 130 hr

## 2021-05-16 NOTE — ED Notes (Signed)
We are unable to get a temperature on the pt and will not do a rectal since she's actively bleeding from rectum.

## 2021-05-16 NOTE — ED Notes (Signed)
Pt appears to be asleep. NAD noted. 

## 2021-05-16 NOTE — ED Notes (Signed)
Admitting doctor/GI at bedside.

## 2021-05-16 NOTE — ED Notes (Signed)
Pt had a BM. Half stool, half bright red blood with clots. Peri-care completed. Diaper changed, partial bed change completed. Pt pulled up in bed. BP has remained WNL.

## 2021-05-16 NOTE — ED Notes (Signed)
Pt resting in bed comfortably- NAD noted 

## 2021-05-16 NOTE — ED Notes (Signed)
Admitting physician at bedside speaking with patient's daughter/granddaughter.

## 2021-05-16 NOTE — ED Notes (Signed)
Pt here with lower GI bleed. Pt follows minimal commands with face, but is contracted throughout body. Pt responsive to pain. This is baseline for pt.

## 2021-05-16 NOTE — ED Provider Notes (Signed)
MSE was initiated and I personally evaluated the patient and placed orders (if any) at  2:50 PM on May 16, 2021.  I was called to the bedside emergently due to the patient coming in from EMS and was found to have severe hypotension in the setting of an acute GI bleed.  Reportedly started this morning with large amounts of blood from bowel movements.  EMS reports tachycardia but normal blood pressures but here she is consistently in the 40s and 60s systolic.  She appears chronically ill and now acutely so.  She has contractures.  She was started on fluid and blood resuscitation.  Fortunately her blood pressure has come up.  She will need further care and especially discussion with family as far as CODE STATUS and what type of work-up/treatment they would prefer, how aggressive they want to be.  The patient appears stable so that the remainder of the MSE may be completed by another provider.   Pricilla Loveless, MD 05/16/21 [phone]

## 2021-05-17 DIAGNOSIS — R578 Other shock: Secondary | ICD-10-CM | POA: Insufficient documentation

## 2021-05-17 LAB — TYPE AND SCREEN
ABO/RH(D): A POS
Antibody Screen: NEGATIVE
Unit division: 0
Unit division: 0
Unit division: 0

## 2021-05-17 LAB — BPAM RBC
Blood Product Expiration Date: 202208292359
Blood Product Expiration Date: 202208292359
Blood Product Expiration Date: 202209192359
ISSUE DATE / TIME: 202208161457
ISSUE DATE / TIME: 202208161534
ISSUE DATE / TIME: 202208161539
Unit Type and Rh: 5100
Unit Type and Rh: 5100
Unit Type and Rh: 5100

## 2021-05-17 LAB — BASIC METABOLIC PANEL
Anion gap: 6 (ref 5–15)
BUN: 17 mg/dL (ref 8–23)
CO2: 24 mmol/L (ref 22–32)
Calcium: 8.1 mg/dL — ABNORMAL LOW (ref 8.9–10.3)
Chloride: 121 mmol/L — ABNORMAL HIGH (ref 98–111)
Creatinine, Ser: 0.37 mg/dL — ABNORMAL LOW (ref 0.44–1.00)
GFR, Estimated: >60 mL/min (ref >60)
Glucose, Bld: 87 mg/dL (ref 70–99)
Potassium: 3.8 mmol/L (ref 3.5–5.1)
Sodium: 151 mmol/L — ABNORMAL HIGH (ref 135–145)

## 2021-05-17 LAB — BLOOD PRODUCT ORDER (VERBAL) VERIFICATION

## 2021-05-17 LAB — CBC
HCT: 45.9 % (ref 36.0–46.0)
Hemoglobin: 14.9 g/dL (ref 12.0–15.0)
MCH: 28.5 pg (ref 26.0–34.0)
MCHC: 32.5 g/dL (ref 30.0–36.0)
MCV: 87.8 fL (ref 80.0–100.0)
Platelets: 95 10*3/uL — ABNORMAL LOW (ref 150–400)
RBC: 5.23 MIL/uL — ABNORMAL HIGH (ref 3.87–5.11)
RDW: 17.1 % — ABNORMAL HIGH (ref 11.5–15.5)
WBC: 6.5 10*3/uL (ref 4.0–10.5)
nRBC: 0 % (ref 0.0–0.2)

## 2021-05-17 LAB — MAGNESIUM: Magnesium: 2.1 mg/dL (ref 1.7–2.4)

## 2021-05-17 LAB — PHOSPHORUS: Phosphorus: 2.6 mg/dL (ref 2.5–4.6)

## 2021-05-17 MED ORDER — MORPHINE SULFATE (PF) 2 MG/ML IV SOLN
1.0000 mg | INTRAVENOUS | Status: DC | PRN
  Administered 2021-05-17: 1 mg via INTRAVENOUS
  Filled 2021-05-17: qty 1

## 2021-05-17 MED ORDER — DEXTROSE IN LACTATED RINGERS 5 % IV SOLN: INTRAVENOUS | Status: AC

## 2021-05-17 NOTE — ED Notes (Signed)
Pt's diaper changed and pulled up in bed. Had another small BM with bright red stool and one very large clot. Pt's family went home about 45 minutes ago.

## 2021-05-17 NOTE — ED Notes (Signed)
Pt appears to be asleep. NAD noted. 

## 2021-05-17 NOTE — Evaluation (Signed)
Clinical/Bedside Swallow Evaluation Patient Details  Name: Kristi Webb MRN: [id number] Date of Birth: 1932/03/17  Today's Date: 05/17/2021 Time: SLP Start Time (ACUTE ONLY): 1555 SLP Stop Time (ACUTE ONLY): 1613 SLP Time Calculation (min) (ACUTE ONLY): 18 min  Past Medical History:  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   SDH (subdural hematoma) (HCC)    b/l 11/2011 at [facility] placed on keppra 250 mg bid    Seizure (HCC)    Thrush    Thyroid disease    Past Surgical History:  Past Surgical History:  Procedure Laterality Date   OTHER SURGICAL HISTORY  04/21/2011   left hip internal fixation and reduction intertrochanteric and femoral neck   OTHER SURGICAL HISTORY  ~2 yrs ago   brain surgery   HPI:  Pt is an 87 yo female presenting with hemorrhagic shock 2/2 GIB. Per family, pt eats soft, bite-sized pieces of food at baseline. MBS March 2015 with no aspiration. Mild impact on oral phase from cognition but regular solids/thin liquids recommended at that time. PMH includes: dementia, FTT, SDH, seizure, thyroid disease   Assessment / Plan / Recommendation Clinical Impression  Pt's current mentation and deconditioned state, in the setting of baseline advanced dementia,  have impacted her ability to safely consume POs. Family reports that she eats bite-sized pieces of food at home (seemingly most consistent with mechanical soft textures) with minimal difficulty, but today she makes no attempts to orally accept boluses regardless of texture or delivery method. She consistently opens her mouth to tactile stimulation, but does not close her lips or engage in PO intake beyond this. Recommend that she remain NPO pending improvements in mentation and ability to engage with PO trials offered. Her daughter acknowledged that she was not interacting with POs offered, but also voiced concerns about  nutrition. She also asked SLP to check on pt's thyroid - given that this is out of SLP's scope of practice, will defer any w/u of this to MD. Will continue to follow for potential to resume POs. SLP Visit Diagnosis: Dysphagia, unspecified (R13.10)    Aspiration Risk  Severe aspiration risk;Risk for inadequate nutrition/hydration    Diet Recommendation NPO   Medication Administration: Via alternative means    Other  Recommendations Oral Care Recommendations: Oral care QID   Follow up Recommendations  (tba)      Frequency and Duration min 2x/week  2 weeks       Prognosis Prognosis for Safe Diet Advancement: Fair Barriers to Reach Goals: Cognitive deficits;Severity of deficits      Swallow Study   General HPI: Pt is an 87 yo female presenting with hemorrhagic shock 2/2 GIB. Per family, pt eats soft, bite-sized pieces of food at baseline. MBS March 2015 with no aspiration. Mild impact on oral phase from cognition but regular solids/thin liquids recommended at that time. PMH includes: dementia, FTT, SDH, seizure, thyroid disease Type of Study: Bedside Swallow Evaluation Previous Swallow Assessment: see HPI Diet Prior to this Study: NPO Temperature Spikes Noted: No Respiratory Status: Room air History of Recent Intubation: No Behavior/Cognition: Lethargic/Drowsy;Doesn't follow directions Oral Cavity Assessment: Within Functional Limits Oral Care Completed by SLP: No Self-Feeding Abilities: Total assist Patient Positioning: Upright in bed Baseline Vocal Quality: Not observed Volitional Cough: Cognitively unable to elicit Volitional Swallow: Unable to elicit    Oral/Motor/Sensory Function Overall  Oral Motor/Sensory Function:  (cognitively not able to test)   Ice Chips Ice chips: Not tested   Thin Liquid Thin Liquid: Impaired Presentation: Spoon;Straw (swab) Oral Phase Impairments: Poor awareness of bolus Pharyngeal  Phase Impairments: Unable to trigger swallow    Nectar Thick  Nectar Thick Liquid: Not tested   Honey Thick Honey Thick Liquid: Not tested   Puree Puree: Impaired Presentation: Spoon Oral Phase Impairments: Poor awareness of bolus Pharyngeal Phase Impairments: Unable to trigger swallow   Solid     Solid: Not tested      Mahala Menghini., M.A. CCC-SLP Acute Rehabilitation Services Pager [phone] Office [phone]  05/17/2021,4:53 PM

## 2021-05-17 NOTE — ED Notes (Signed)
Pt had BM with no obvious blood in stool. Pt was cleaned and changed.

## 2021-05-17 NOTE — ED Notes (Signed)
Report given to Alexis, RN

## 2021-05-17 NOTE — Progress Notes (Signed)
  [facility] ROUNDING NOTE   Subjective: Interval history taken from the medical record and the patient's daughters who report that the patient seems to be doing okay today.  No further episodes of frank hematochezia.  She has been passing stool, but it is not overtly bloody.  HD stable.  Hgb stable overnight.   Objective: Vital signs in last 24 hours: Temp:  [94.5 F (34.7 C)-97 F (36.1 C)] 97 F (36.1 C) (08/17 2200) Pulse Rate:  [63-97] 97 (08/17 2200) Resp:  [10-16] 13 (08/17 2200) BP: (96-165)/(54-104) 123/89 (08/17 2200) SpO2:  [99 %-100 %] 100 % (08/17 2200) Weight:  [41.7 kg] 41.7 kg (08/17 1800)   General:   Cachectic, demented Af Am fm in NAD, accompanied by 2 daughters Lungs:  Respirations even and unlabored. Lungs clear to auscultation bilaterally.   No wheezes, crackles, or rhonchi.  Heart:  Regular rate and rhythm; no MRG Abdomen:  Soft, nondistended, nontender. Normal bowel sounds. No appreciable masses or hepatomegaly.  Rectal:  Not performed.  Msk:  B/l upper extremities contractures at elbows, R hip preferentially in an external rotated position, but not contracted  Intake/Output from previous day: 08/16 0701 - 08/17 0700 In: 1967.1 [I.V.:1117.1; IV Piggyback:850] Out: -  Intake/Output this shift: No intake/output data recorded.   Lab Results: Recent Labs    05/16/21 1513 05/16/21 1556 05/16/21 2002 05/17/21 0300  WBC 6.5  --   --  6.5  HGB 12.3 13.6 14.8 14.9  PLT 161  --   --  95*  MCV 93.9  --   --  87.8   BMET Recent Labs    05/16/21 1513 05/16/21 1556 05/16/21 2002 05/17/21 0300  NA 157* 160* 154* 151*  K 3.2* 3.3* 3.8 3.8  CL 121* 122* 122* 121*  CO2 27  --  26 24  GLUCOSE 121* 130* 87 87  BUN 20 20 19 17   CREATININE 0.60 0.40* 0.48 0.37*  CALCIUM 8.8*  --  8.2* 8.1*   LFT Recent Labs    05/16/21 1513  PROT 6.4*  ALBUMIN 2.4*  AST 24  ALT 18  ALKPHOS 69  BILITOT 0.8   PT/INR Recent Labs    05/16/21 1600   INR 1.3*      Imaging/Other results: No results found.    Assessment and Plan:   87 y/o m with advanced dementia, presented with massive lower GI bleed, presumed diverticular, resolved spontaneously.  Spoke with patient's daughters and nursing staff today.  No further evidence of GI bleeding today.  GI will sign off.  Please reconsult if patient rebleeds and family desires invasive interventions.  Tennelle Taflinger E. 97, MD [facility]   Tomasa Rand, DO  05/17/2021, 10:26 PM [facility] Pager [phone]

## 2021-05-17 NOTE — Progress Notes (Signed)
Pt arrived to unit from ED for GI bleed ,Pt stays at home with daughter and has a sitter, oriented to voice , CCMD called ,CHG given, Temp 94.5 Rectal MD paged . Will continue to monitor.   Karna Christmas Cheetara Hoge, RN    05/17/21 1800  Vitals  Temp (!) 94.5 F (34.7 C)  Temp Source Rectal  BP 126/78  MAP (mmHg) 91  BP Location Left Arm  BP Method Automatic  Patient Position (if appropriate) Lying  Pulse Rate 63  Pulse Rate Source Monitor  ECG Heart Rate 63  Resp 11  Level of Consciousness  Level of Consciousness Responds to Voice  Oxygen Therapy  SpO2 100 %  O2 Device Room Air  O2 Flow Rate (L/min) 0 L/min  PAINAD (Pain Assessment in Advanced Dementia)  Breathing 0  Negative Vocalization 0  Facial Expression 0  Body Language 0  Consolability 0  PAINAD Score 0  Glasgow Coma Scale  Eye Opening 3  Best Verbal Response (NON-intubated) 1  Best Motor Response 3  Glasgow Coma Scale Score 7  Height and Weight  Weight 41.7 kg  Type of Scale Used Bed  MEWS Score  MEWS Temp 2  MEWS Systolic 0  MEWS Pulse 0  MEWS RR 1  MEWS LOC 1  MEWS Score 4  MEWS Score Color Red

## 2021-05-17 NOTE — Progress Notes (Addendum)
PROGRESS NOTE    Kristi Webb  ZOX:[id number] DOB: 04/01/1932 DOA: 05/16/2021 PCP: Barbette MerinoKing, Crystal M, NP    No chief complaint on file.   Brief Narrative:  Kristi ArabMazie F Watt is a 87 y.o. female with advanced Alzheimer's dementia, hypertension, thyroid disease, seizures, prior subdural hematoma brought into the ED by family and concern for rectal hemorrhage since 1 PM today.Per ED physician family brought in a "bucket full of blood".  On initial ED evaluation patient severely hypotensive with systolic 40s associated with lethargy.  Patient was found to have a large amount of bright rectal bleeding and had waxing and waning alertness.  Patient quickly was transfused with 2 units of emergency release blood and started on fluid boluses. GI and critical care were consulted.  ED physician, PCCM and GI had discussions with family regarding critical condition, risks of anesthesia, prognosis and CODE STATUS.  Patient was DNR/DNI prior to presentation and family chose to continue the same.  Fortunately her blood pressure did improve with blood and fluid resuscitation.  Family would like to continue IV fluids and nonaggressive modes of treatment as patient has shown improvement at this time understanding risk of deterioration during the hospital stay.  Subjective:  No active bleeding, vital signs stable Family at bedside  She remains npo  Assessment & Plan:   Principal Problem:   Shock circulatory (HCC) Active Problems:   Acute hypernatremia   Alzheimer's dementia without behavioral disturbance (HCC)   Hypothyroidism   Essential hypertension   Seizure disorder (HCC)   Lower GI bleed   GIB (gastrointestinal bleeding)   Hemorrhagic shock/rectal bleeding -Seen by GI and critical care, conservative management -Blood pressure improved after PRBC x2 units and fluids bolus, hemoglobin 14.9 this morning -May start diet if no procedure planned today   Hypernatremia Likely from volume depletion She  received half-normal saline since admission, sodium, down from 1 60-1 50 -She is hyperchloremic, change half-normal saline to D5 LR -Repeat BMP  Hypokalemia Replaced and improved  Advanced dementia presented with acute metabolic encephalopathy Due to hemorrhagic shock and hyponatremia Hold Aricept and Namenda Resume when mental status improves Currently continue npo, ivf, will get speech eval  QTC prolongation Keep K above 4, mag above 2 Repeat EKG to monitor QTC, order placed  Seizure disorder with history of subdural hematoma Home medication Depakote changed to IV due to unable to tolerate p.o. from altered mental status  Hypothyroidism Currently on IV Synthroid, transition to p.o. when mental status improves  Failure to thrive, total care at baseline, 50lbs weight loss in the last year Continued goals of care discussion, palliative care consulted     Skin Assessment:  I have examined the patient's skin and I agree with the wound assessment as performed by the wound care RN as outlined below:  Pressure Ulcer 05/28/12 7cm x 2cm open area red, yellow, white   (Active)  05/28/12 1459  Location: Sacrum  Location Orientation: Mid;Right;Upper  Staging:   Wound Description (Comments): 7cm x 2cm open area red, yellow, white    Present on Admission: Yes     Pressure Ulcer 05/28/12 Stage III -  Full thickness tissue loss. Subcutaneous fat may be visible but bone, tendon or muscle are NOT exposed. 4cm x 2cm open area red, pink yellow (Active)  05/28/12 1600  Location: Sacrum  Location Orientation: Right;Lower;Mid  Staging: Stage III -  Full thickness tissue loss. Subcutaneous fat may be visible but bone, tendon or muscle are NOT exposed.  Wound  Description (Comments): 4cm x 2cm open area red, pink yellow  Present on Admission: Yes     Pressure Ulcer 05/28/12 Stage III -  Full thickness tissue loss. Subcutaneous fat may be visible but bone, tendon or muscle are NOT exposed. 3cm  x 1 cm open area red, pink yellow (Active)  05/28/12 1600  Location: Sacrum  Location Orientation: Right;Lower  Staging: Stage III -  Full thickness tissue loss. Subcutaneous fat may be visible but bone, tendon or muscle are NOT exposed.  Wound Description (Comments): 3cm x 1 cm open area red, pink yellow  Present on Admission: Yes     Pressure Ulcer 05/28/12 Stage I -  Intact skin with non-blanchable redness of a localized area usually over a bony prominence. bruised looking area over left outer ankle (Active)  05/28/12 1600  Location: Ankle  Location Orientation: Left;Lateral  Staging: Stage I -  Intact skin with non-blanchable redness of a localized area usually over a bony prominence.  Wound Description (Comments): bruised looking area over left outer ankle  Present on Admission:     Unresulted Labs (From admission, onward)     Start     Ordered   05/16/21 1824  TSH  Add-on,   AD        05/16/21 1823              DVT prophylaxis: SCDs Start: 05/16/21 1948   Code Status:DNR Family Communication: daughter at bedside, vickye would like to be contacted day or night  Disposition:   Status is: Inpatient  Dispo: The patient is from: home              Anticipated d/c is to: TBD              Anticipated d/c date is: TBD, need to monitor bleed, need palliative care input                Consultants:  GI Critical care Palliative care  Procedures:  none  Antimicrobials:    Anti-infectives (From admission, onward)    None          Objective: Vitals:   05/17/21 0200 05/17/21 0300 05/17/21 0500 05/17/21 0800  BP: (!) 144/58 (!) 115/104 (!) 141/77 (!) 125/54  Pulse: 67  65 73  Resp: 14 12 12 10   Temp:      TempSrc:      SpO2: 100%  100% 99%    Intake/Output Summary (Last 24 hours) at 05/17/2021 0816 Last data filed at 05/17/2021 0314 Gross per 24 hour  Intake 1967.13 ml  Output --  Net 1967.13 ml   There were no vitals filed for this  visit.  Examination:  General exam: advance dementia, nonverbal at baseline Respiratory system: Clear to auscultation. Respiratory effort normal. Cardiovascular system: S1 & S2 heard, RRR.  Gastrointestinal system: Abdomen is nondistended, soft and nontender.  Normal bowel sounds heard. Central nervous system: open eyes intermittently, nonverbal, does not follow commands, chronic upper extremity contractures Extremities: chronic contractures, no edema Skin: pressure ulcers presents on admission as described above Psychiatry: advanced dementia, nonverbal, does not follow commands, no agitation currently    Data Reviewed: I have personally reviewed following labs and imaging studies  CBC: Recent Labs  Lab 05/16/21 1513 05/16/21 1556 05/16/21 2002 05/17/21 0300  WBC 6.5  --   --  6.5  NEUTROABS 2.9  --   --   --   HGB 12.3 13.6 14.8 14.9  HCT 41.6 40.0 46.9*  45.9  MCV 93.9  --   --  87.8  PLT 161  --   --  95*    Basic Metabolic Panel: Recent Labs  Lab 05/16/21 1513 05/16/21 1556 05/16/21 2002 05/17/21 0300  NA 157* 160* 154* 151*  K 3.2* 3.3* 3.8 3.8  CL 121* 122* 122* 121*  CO2 27  --  26 24  GLUCOSE 121* 130* 87 87  BUN 20 20 19 17   CREATININE 0.60 0.40* 0.48 0.37*  CALCIUM 8.8*  --  8.2* 8.1*  MG  --   --   --  2.1  PHOS  --   --   --  2.6    GFR: CrCl cannot be calculated (Unknown ideal weight.).  Liver Function Tests: Recent Labs  Lab 05/16/21 1513  AST 24  ALT 18  ALKPHOS 69  BILITOT 0.8  PROT 6.4*  ALBUMIN 2.4*    CBG: No results for input(s): GLUCAP in the last 168 hours.   Recent Results (from the past 240 hour(s))  Resp Panel by RT-PCR (Flu A&B, Covid) Nasopharyngeal Swab     Status: None   Collection Time: 05/16/21  3:37 PM   Specimen: Nasopharyngeal Swab; Nasopharyngeal(NP) swabs in vial transport medium  Result Value Ref Range Status   SARS Coronavirus 2 by RT PCR NEGATIVE NEGATIVE Final    Comment: (NOTE) SARS-CoV-2 target  nucleic acids are NOT DETECTED.  The SARS-CoV-2 RNA is generally detectable in upper respiratory specimens during the acute phase of infection. The lowest concentration of SARS-CoV-2 viral copies this assay can detect is 138 copies/mL. A negative result does not preclude SARS-Cov-2 infection and should not be used as the sole basis for treatment or other patient management decisions. A negative result may occur with  improper specimen collection/handling, submission of specimen other than nasopharyngeal swab, presence of viral mutation(s) within the areas targeted by this assay, and inadequate number of viral copies(<138 copies/mL). A negative result must be combined with clinical observations, patient history, and epidemiological information. The expected result is Negative.  Fact Sheet for Patients:  05/18/21  Fact Sheet for Healthcare Providers:  BloggerCourse.com  This test is no t yet approved or cleared by the [employer] FDA and  has been authorized for detection and/or diagnosis of SARS-CoV-2 by FDA under an Emergency Use Authorization (EUA). This EUA will remain  in effect (meaning this test can be used) for the duration of the COVID-19 declaration under Section 564(b)(1) of the Act, 21 U.S.C.section 360bbb-3(b)(1), unless the authorization is terminated  or revoked sooner.       Influenza A by PCR NEGATIVE NEGATIVE Final   Influenza B by PCR NEGATIVE NEGATIVE Final    Comment: (NOTE) The Xpert Xpress SARS-CoV-2/FLU/RSV plus assay is intended as an aid in the diagnosis of influenza from Nasopharyngeal swab specimens and should not be used as a sole basis for treatment. Nasal washings and aspirates are unacceptable for Xpert Xpress SARS-CoV-2/FLU/RSV testing.  Fact Sheet for Patients: [place]  Fact Sheet for Healthcare  Providers: BloggerCourse.com  This test is not yet approved or cleared by the [employer].[place] FDA and has been authorized for detection and/or diagnosis of SARS-CoV-2 by FDA under an Emergency Use Authorization (EUA). This EUA will remain in effect (meaning this test can be used) for the duration of the COVID-19 declaration under Section 564(b)(1) of the Act, 21 U.S.C. section 360bbb-3(b)(1), unless the authorization is terminated or revoked.  Performed at [facility], [address]  [place], [place] [address]          Radiology Studies: No results found.      Scheduled Meds:  [START ON 05/19/2021] levothyroxine  37.5 mcg Intravenous Daily   pantoprazole (PROTONIX) IV  40 mg Intravenous Q12H   Continuous Infusions:  dextrose 5% lactated ringers     valproate sodium Stopped (05/16/21 2338)     LOS: 1 day   Time spent: Greater than 50% of this time was spent in counseling, explanation of diagnosis, planning of further management, and coordination of care.   Voice Recognition Reubin Milan dictation system was used to create this note, attempts have been made to correct errors. Please contact the author with questions and/or clarifications.   Albertine Grates, MD PhD FACP [facility]  Available via Epic secure chat 7am-7pm for nonurgent issues Please page for urgent issues To page the attending provider between 7A-7P or the covering provider during after hours 7P-7A, please log into the web site [email or web address] and access using universal [place] password for that web site. If you do not have the password, please call the hospital operator.    05/17/2021, 8:16 AM

## 2021-05-17 NOTE — ED Notes (Signed)
Attempted to call report, advised RN will call back for report.

## 2021-05-17 NOTE — Consult Note (Signed)
Consultation  Referring Provider:     Dr. Rush Landmark Primary Care Physician:  [name], NP Primary Gastroenterologist:    none     Reason for Consultation:     Hematochezia with hypovolemic shock         HPI:   Kristi Webb is a 87 y.o. female with a history of advanced dementia with poor functional baseline status (bed-bound, nonverbal) who was brought to the ER by the EMS with abrupt, profuse hematochezia.  First noted by granddaughter around 1 pm (daughter had seen patient around 66 and she appeared at baseline).  She was profoundly hypotensive on presentation (SBP 40s) with decreased alertness on presentation.  IV access was initially difficult, but once established, pt responded well to 2 units pRBCs in terms of BP and alertness. Her family denies any prior history of GI bleeding or of chronic GI symptoms other than constipation.  Her daughter states that although she is nonverbal, she is usually able to communicate pain, and there was no indication she had been in pain prior to the onset of her GI bleed.  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   SDH (subdural hematoma) (HCC)    b/l 11/2011 at [facility] placed on keppra 250 mg bid    Seizure (HCC)    Thrush    Thyroid disease     Past Surgical History:  Procedure Laterality Date   OTHER SURGICAL HISTORY  04/21/2011   left hip internal fixation and reduction intertrochanteric and femoral neck   OTHER SURGICAL HISTORY  ~2 yrs ago   brain surgery    Family History  Problem Relation Age of Onset   Hypertension Other        daughter 1(living)   Suicidality Other        daughter 2   Seizures Neg Hx      Social History   Tobacco Use   Smoking status: Former    Packs/day: 1.00    Years: 40.00    Pack years: 40.00    Types: Cigarettes   Smokeless tobacco: Never  Vaping Use   Vaping Use: Never used  Substance Use  Topics   Alcohol use: No   Drug use: Never    Prior to Admission medications   Medication Sig Start Date End Date Taking? Authorizing Provider  acetaminophen (TYLENOL) 500 MG tablet Take 500-1,000 mg by mouth every 6 (six) hours as needed for mild pain, moderate pain or fever.    [provider]  Ascorbic Acid (VITAMIN C) 1000 MG tablet Take 1,000 mg by mouth daily.    [provider]  aspirin 81 MG tablet Take 81 mg by mouth daily.    [provider]  CALCIUM PO Take 600 mg by mouth daily.    [provider]  cetirizine (ZYRTEC) 10 MG tablet Take 1 tablet (10 mg total) by mouth daily. 07/27/20 07/27/21  [name], NP  Cholecalciferol (VITAMIN D3) 10 MCG (400 UNIT) CAPS Take 10 mcg by mouth daily.    [provider]  divalproex (DEPAKOTE SPRINKLES) 125 MG capsule Take 1 capsule (125 mg total) by mouth 2 (two) times daily. 05/09/20   [name], NP  donepezil (ARICEPT) 10 MG tablet Take 1 tablet (10 mg total) by mouth at bedtime. 10/27/20   [name], NP  levothyroxine (SYNTHROID) 75 MCG tablet Take 1 tablet (75 mcg total) by mouth daily. 10/27/20 10/27/21  [name], NP  memantine (NAMENDA) 5 MG tablet Take 1 tablet (5 mg total) by mouth daily. 10/27/20   [name], NP  Multiple Vitamins-Minerals (MULTIVITAMIN PO) Take by mouth. Silver multivitamin    [provider]    Current Facility-Administered Medications  Medication Dose Route Frequency Provider Last Rate Last Admin   0.45 % sodium chloride infusion   Intravenous Continuous Simonne Martinet, NP 125 mL/hr at 05/16/21 1814 New Bag at 05/16/21 1814   [START ON 05/19/2021] levothyroxine (SYNTHROID, LEVOTHROID) injection 37.5 mcg  37.5 mcg Intravenous Daily Kamineni, Neelima, MD       ondansetron (ZOFRAN) tablet 4 mg  4 mg Oral Q6H PRN Alessandra Bevels, MD       Or   ondansetron (ZOFRAN) injection 4 mg  4 mg Intravenous Q6H PRN Alessandra Bevels, MD        pantoprazole (PROTONIX) injection 40 mg  40 mg Intravenous Q12H Simonne Martinet, NP   40 mg at 05/16/21 2228   valproate (DEPACON) 125 mg in dextrose 5 % 50 mL IVPB  125 mg Intravenous Q12H Alessandra Bevels, MD   Stopped at 05/16/21 2338   Current Outpatient Medications  Medication Sig Dispense Refill   acetaminophen (TYLENOL) 500 MG tablet Take 500-1,000 mg by mouth every 6 (six) hours as needed for mild pain, moderate pain or fever.     Ascorbic Acid (VITAMIN C) 1000 MG tablet Take 1,000 mg by mouth daily.     aspirin 81 MG tablet Take 81 mg by mouth daily.     CALCIUM PO Take 600 mg by mouth daily.     cetirizine (ZYRTEC) 10 MG tablet Take 1 tablet (10 mg total) by mouth daily. 90 tablet 3   Cholecalciferol (VITAMIN D3) 10 MCG (400 UNIT) CAPS Take 10 mcg by mouth daily.     divalproex (DEPAKOTE SPRINKLES) 125 MG capsule Take 1 capsule (125 mg total) by mouth 2 (two) times daily. 180 capsule 3   donepezil (ARICEPT) 10 MG tablet Take 1 tablet (10 mg total) by mouth at bedtime. 90 tablet 3   levothyroxine (SYNTHROID) 75 MCG tablet Take 1 tablet (75 mcg total) by mouth daily. 90 tablet 3   memantine (NAMENDA) 5 MG tablet Take 1 tablet (5 mg total) by mouth daily. 90 tablet 3   Multiple Vitamins-Minerals (MULTIVITAMIN PO) Take by mouth. Silver multivitamin      Allergies as of 05/16/2021   (No Known Allergies)     Review of Systems:    Patient nonverbal    Physical Exam:  Vital signs in last 24 hours: Temp:  [97.3 F (36.3 C)] 97.3 F (36.3 C) (08/16 1943) Pulse Rate:  [65-103] 73 (08/16 2330) Resp:  [9-21] 13 (08/16 2330) BP: (40-158)/(28-82) 133/58 (08/16 2330) SpO2:  [97 %-100 %] 100 % (08/16 2330)   General:   Cachectic, demented Af Am fm in NAD, accompanied by 2 daughters Head:  Normocephalic and atraumatic. Eyes:   No icterus.   Conjunctiva pink. Oropharynx:  Scant dried blood around lips/gums.  Poor dentition, dry mucus membranes Lungs:  Respirations even and  unlabored. Lungs clear to auscultation bilaterally.   No wheezes, crackles, or rhonchi.  Heart:  Regular rate and rhythm; no MRG Abdomen:  Soft, nondistended, nontender. Normal bowel sounds. No appreciable masses or hepatomegaly.  Rectal:  Not performed.  Msk:  B/l upper extremities contractures at  elbows, R hip preferentially in an external rotated position, but not contracted Extremities:  Without edema. Neurologic:  Alert following resuscitation, nonverbal, able to follow some simple commands  LAB RESULTS: Recent Labs    05/16/21 1513 05/16/21 1556 05/16/21 2002  WBC 6.5  --   --   HGB 12.3 13.6 14.8  HCT 41.6 40.0 46.9*  PLT 161  --   --    BMET Recent Labs    05/16/21 1513 05/16/21 1556 05/16/21 2002  NA 157* 160* 154*  K 3.2* 3.3* 3.8  CL 121* 122* 122*  CO2 27  --  26  GLUCOSE 121* 130* 87  BUN 20 20 19   CREATININE 0.60 0.40* 0.48  CALCIUM 8.8*  --  8.2*   LFT Recent Labs    05/16/21 1513  PROT 6.4*  ALBUMIN 2.4*  AST 24  ALT 18  ALKPHOS 69  BILITOT 0.8   PT/INR Recent Labs    05/16/21 1600  LABPROT 16.3*  INR 1.3*    STUDIES: No results found.   PREVIOUS ENDOSCOPIES:            None recent   Impression / Plan:   87 y/o fm with advanced dementia and poor baseline status (bed bound, nonverbal, contractures, dependent for all ADLs) presented with profuse, presumable painless hematochezia.  BUN not significantly elevated, so most likely diverticular hemorrhage, although stercoral ulcer also possible given risk factors.   She appears to have stopped bleeding spontaneously based on lack of further hematochezia and hemodynamic stabilization.    Given the patient's poor baseline status, I had cautioned the family against pursuing invasive measures to treat her hemorrhage, and to consider optimal goals of care for her situation.  Although endoscopy could be considered, it does not appear to be absolutely necessary at this point and I think further  discussions with the family and palliative care team will be helpful to further delineate how invasive her treatment and work up should be.    LOS: 1 day   97  05/17/2021, 12:20 AM

## 2021-05-18 ENCOUNTER — Encounter (HOSPITAL_COMMUNITY): Payer: Self-pay | Admitting: Internal Medicine

## 2021-05-18 ENCOUNTER — Other Ambulatory Visit: Payer: Self-pay

## 2021-05-18 DIAGNOSIS — Z7189 Other specified counseling: Secondary | ICD-10-CM

## 2021-05-18 DIAGNOSIS — Z66 Do not resuscitate: Secondary | ICD-10-CM

## 2021-05-18 DIAGNOSIS — Z515 Encounter for palliative care: Secondary | ICD-10-CM

## 2021-05-18 DIAGNOSIS — Z711 Person with feared health complaint in whom no diagnosis is made: Secondary | ICD-10-CM

## 2021-05-18 DIAGNOSIS — Z789 Other specified health status: Secondary | ICD-10-CM

## 2021-05-18 LAB — BASIC METABOLIC PANEL
Anion gap: 10 (ref 5–15)
BUN: 10 mg/dL (ref 8–23)
CO2: 25 mmol/L (ref 22–32)
Calcium: 8.7 mg/dL — ABNORMAL LOW (ref 8.9–10.3)
Chloride: 112 mmol/L — ABNORMAL HIGH (ref 98–111)
Creatinine, Ser: 0.48 mg/dL (ref 0.44–1.00)
GFR, Estimated: >60 mL/min (ref >60)
Glucose, Bld: 111 mg/dL — ABNORMAL HIGH (ref 70–99)
Potassium: 3.4 mmol/L — ABNORMAL LOW (ref 3.5–5.1)
Sodium: 147 mmol/L — ABNORMAL HIGH (ref 135–145)

## 2021-05-18 LAB — CBC
HCT: 41.6 % (ref 36.0–46.0)
Hemoglobin: 14 g/dL (ref 12.0–15.0)
MCH: 28.1 pg (ref 26.0–34.0)
MCHC: 33.7 g/dL (ref 30.0–36.0)
MCV: 83.4 fL (ref 80.0–100.0)
Platelets: 114 10*3/uL — ABNORMAL LOW (ref 150–400)
RBC: 4.99 MIL/uL (ref 3.87–5.11)
RDW: 16.7 % — ABNORMAL HIGH (ref 11.5–15.5)
WBC: 5.2 10*3/uL (ref 4.0–10.5)
nRBC: 0 % (ref 0.0–0.2)

## 2021-05-18 LAB — GLUCOSE, CAPILLARY: Glucose-Capillary: 111 mg/dL — ABNORMAL HIGH (ref 70–99)

## 2021-05-18 MED ORDER — POTASSIUM CHLORIDE 10 MEQ/100ML IV SOLN
10.0000 meq | INTRAVENOUS | Status: AC
  Administered 2021-05-18 (×4): 10 meq via INTRAVENOUS
  Filled 2021-05-18 (×4): qty 100

## 2021-05-18 MED ORDER — DIGOXIN 0.25 MG/ML IJ SOLN
0.2500 mg | Freq: Once | INTRAMUSCULAR | Status: AC
  Administered 2021-05-18: 0.25 mg via INTRAVENOUS
  Filled 2021-05-18: qty 1

## 2021-05-18 MED ORDER — DEXTROSE IN LACTATED RINGERS 5 % IV SOLN: INTRAVENOUS | Status: DC

## 2021-05-18 NOTE — Significant Event (Addendum)
Rapid Response Event Note   Reason for Call :  HR 119, RR 28, intermittent hypotension Red MEWS, trending sepsis score    Initial Focused Assessment:  Pt lying in bed. Eyes open to touch, pt tracks RN in room. PERRLA, 24mm. Skin is very warm, dry, tenting- currently bare-hugger is in use. Pt does not appear to be in distress. Lung sounds are clear.   VS: T 100.81F, BP 72/52, HR 102, RR 20, SpO2 100% on room air CBG 111  Interventions:  -Bare-hugger removed -Verified temperature via rectal temp  Plan of Care:  -Pt HR improved to 98-102 bpm following removal of bare-hugger -Pt BP trending up following removal of bare-hugger -Continue to closely monitor temperatures -Consider IVF or IVF bolus if hypotension persists- per MD -MEWS now yellow, follow MEWS guidelines  Call rapid response for additional needs.   Event Summary:  Call Time: 1145 Arrival Time: 1205 End Time: 1220  Jennye Moccasin, RN

## 2021-05-18 NOTE — Progress Notes (Signed)
Patient HR is ranging from 120s to 130s,B/P 100/66,Temp 98.2.Dr. Valente David made aware with order made.Will continue to monitor.

## 2021-05-18 NOTE — Progress Notes (Addendum)
PROGRESS NOTE    Kristi Webb  LEX:[id number] DOB: [birth year] DOA: 05/16/2021 PCP: [name], NP    No chief complaint on file.   Brief Narrative:  Kristi Webb is a 87 y.o. female with advanced Alzheimer's dementia, hypertension, thyroid disease, seizures, prior subdural hematoma brought into the ED by family and concern for rectal hemorrhage since 1 PM today.Per ED physician family brought in a "bucket full of blood".  On initial ED evaluation patient severely hypotensive with systolic 40s associated with lethargy.  Patient was found to have a large amount of bright rectal bleeding and had waxing and waning alertness.  Patient quickly was transfused with 2 units of emergency release blood and started on fluid boluses. GI and critical care were consulted.  ED physician, PCCM and GI had discussions with family regarding critical condition, risks of anesthesia, prognosis and CODE STATUS.  Patient was DNR/DNI prior to presentation and family chose to continue the same.  Fortunately her blood pressure did improve with blood and fluid resuscitation.  Family would like to continue IV fluids and nonaggressive modes of treatment as patient has shown improvement at this time understanding risk of deterioration during the hospital stay.  Subjective:  No active bleeding, she kept seeping pasty stool, stool is brown, no visible blood  She remains npo due to minimal responsiveness  Assessment & Plan:   Principal Problem:   Shock circulatory (HCC) Active Problems:   Acute hypernatremia   Alzheimer's dementia without behavioral disturbance (HCC)   Hypothyroidism   Essential hypertension   Seizure disorder (HCC)   Lower GI bleed   GIB (gastrointestinal bleeding)   Hemorrhagic shock/rectal bleeding -Seen by GI and critical care, conservative management -Blood pressure improved after PRBC x2 units and fluids bolus, hemoglobin 14.9 this morning -no active bleed since  admission   Hypernatremia Likely from volume depletion She received half-normal saline since admission, sodium, down from 1 60-1 50 -She is hyperchloremic, changed from half-normal saline to D5 LR, continue for another 24hrs, sodium improving, remain npo -Repeat BMP  Hypokalemia Continue to replace by iv  Advanced dementia presented with acute metabolic encephalopathy Due to hemorrhagic shock and hyponatremia Hold Aricept and Namenda Resume when mental status improves Currently continue npo, ivf, will get speech eval  QTC prolongation Keep K above 4, mag above 2 Repeat EKG to monitor QTC, order placed  Seizure disorder with history of subdural hematoma Home medication Depakote changed to IV due to unable to tolerate p.o. from altered mental status  Hypothyroidism Currently on IV Synthroid, transition to p.o. when mental status improves  Failure to thrive, total care at baseline, 50lbs weight loss in the last year Continued goals of care discussion, palliative care consulted     Skin Assessment:  I have examined the patient's skin and I agree with the wound assessment as performed by the wound care RN as outlined below:  Pressure Ulcer 05/28/12 Stage I -  Intact skin with non-blanchable redness of a localized area usually over a bony prominence. bruised looking area over left outer ankle (Active)  05/28/12 1600  Location: Ankle  Location Orientation: Left;Lateral  Staging: Stage I -  Intact skin with non-blanchable redness of a localized area usually over a bony prominence.  Wound Description (Comments): bruised looking area over left outer ankle  Present on Admission:     Unresulted Labs (From admission, onward)     Start     Ordered   05/16/21 1824  TSH  Add-on,  AD        05/16/21 1823              DVT prophylaxis: SCDs Start: 05/16/21 1948   Code Status:DNR Family Communication: daughter at bedside on 8/17, vickye would like to be contacted day or  night  Disposition:   Status is: Inpatient  Dispo: The patient is from: home              Anticipated d/c is to: TBD              Anticipated d/c date is: TBD, need to monitor bleed, need palliative care input                Consultants:  GI Critical care Palliative care  Procedures:  none  Antimicrobials:    Anti-infectives (From admission, onward)    None          Objective: Vitals:   05/18/21 1215 05/18/21 1220 05/18/21 1300 05/18/21 1437  BP: (!) 72/52 (!) 86/54 101/69 120/87  Pulse: (!) 102 100 100 96  Resp: (!) 21 (!) 22 19 16   Temp: 100.3 F (37.9 C)   99.6 F (37.6 C)  TempSrc: Rectal   Rectal  SpO2: 99% 97% 97% 96%  Weight:        Intake/Output Summary (Last 24 hours) at 05/18/2021 1507 Last data filed at 05/18/2021 0418 Gross per 24 hour  Intake --  Output 400 ml  Net -400 ml   Filed Weights   05/17/21 1800  Weight: 41.7 kg    Examination:  General exam: advance dementia, nonverbal at baseline Respiratory system: Clear to auscultation. Respiratory effort normal. Cardiovascular system: S1 & S2 heard, RRR.  Gastrointestinal system: Abdomen is nondistended, soft and nontender.  Normal bowel sounds heard. Central nervous system: open eyes intermittently, nonverbal, does not follow commands, chronic upper extremity contractures Extremities: chronic contractures, no edema Skin: pressure ulcers presents on admission as described above Psychiatry: advanced dementia, nonverbal, does not follow commands, no agitation currently    Data Reviewed: I have personally reviewed following labs and imaging studies  CBC: Recent Labs  Lab 05/16/21 1513 05/16/21 1556 05/16/21 2002 05/17/21 0300 05/18/21 0211  WBC 6.5  --   --  6.5 5.2  NEUTROABS 2.9  --   --   --   --   HGB 12.3 13.6 14.8 14.9 14.0  HCT 41.6 40.0 46.9* 45.9 41.6  MCV 93.9  --   --  87.8 83.4  PLT 161  --   --  95* 114*    Basic Metabolic Panel: Recent Labs  Lab 05/16/21 1513  05/16/21 1556 05/16/21 2002 05/17/21 0300 05/18/21 0211  NA 157* 160* 154* 151* 147*  K 3.2* 3.3* 3.8 3.8 3.4*  CL 121* 122* 122* 121* 112*  CO2 27  --  26 24 25   GLUCOSE 121* 130* 87 87 111*  BUN 20 20 19 17 10   CREATININE 0.60 0.40* 0.48 0.37* 0.48  CALCIUM 8.8*  --  8.2* 8.1* 8.7*  MG  --   --   --  2.1  --   PHOS  --   --   --  2.6  --     GFR: Estimated Creatinine Clearance: 32.6 mL/min (by C-G formula based on SCr of 0.48 mg/dL).  Liver Function Tests: Recent Labs  Lab 05/16/21 1513  AST 24  ALT 18  ALKPHOS 69  BILITOT 0.8  PROT 6.4*  ALBUMIN 2.4*    CBG: Recent  Labs  Lab 05/18/21 1220  GLUCAP 111*     Recent Results (from the past 240 hour(s))  Resp Panel by RT-PCR (Flu A&B, Covid) Nasopharyngeal Swab     Status: None   Collection Time: 05/16/21  3:37 PM   Specimen: Nasopharyngeal Swab; Nasopharyngeal(NP) swabs in vial transport medium  Result Value Ref Range Status   SARS Coronavirus 2 by RT PCR NEGATIVE NEGATIVE Final    Comment: (NOTE) SARS-CoV-2 target nucleic acids are NOT DETECTED.  The SARS-CoV-2 RNA is generally detectable in upper respiratory specimens during the acute phase of infection. The lowest concentration of SARS-CoV-2 viral copies this assay can detect is 138 copies/mL. A negative result does not preclude SARS-Cov-2 infection and should not be used as the sole basis for treatment or other patient management decisions. A negative result may occur with  improper specimen collection/handling, submission of specimen other than nasopharyngeal swab, presence of viral mutation(s) within the areas targeted by this assay, and inadequate number of viral copies(<138 copies/mL). A negative result must be combined with clinical observations, patient history, and epidemiological information. The expected result is Negative.  Fact Sheet for Patients:  BloggerCourse.com  Fact Sheet for Healthcare Providers:   SeriousBroker.it  This test is no t yet approved or cleared by the [place] FDA and  has been authorized for detection and/or diagnosis of SARS-CoV-2 by FDA under an Emergency Use Authorization (EUA). This EUA will remain  in effect (meaning this test can be used) for the duration of the COVID-19 declaration under Section 564(b)(1) of the Act, 21 U.S.C.section 360bbb-3(b)(1), unless the authorization is terminated  or revoked sooner.       Influenza A by PCR NEGATIVE NEGATIVE Final   Influenza B by PCR NEGATIVE NEGATIVE Final    Comment: (NOTE) The Xpert Xpress SARS-CoV-2/FLU/RSV plus assay is intended as an aid in the diagnosis of influenza from Nasopharyngeal swab specimens and should not be used as a sole basis for treatment. Nasal washings and aspirates are unacceptable for Xpert Xpress SARS-CoV-2/FLU/RSV testing.  Fact Sheet for Patients: BloggerCourse.com  Fact Sheet for Healthcare Providers: SeriousBroker.it  This test is not yet approved or cleared by the [place] FDA and has been authorized for detection and/or diagnosis of SARS-CoV-2 by FDA under an Emergency Use Authorization (EUA). This EUA will remain in effect (meaning this test can be used) for the duration of the COVID-19 declaration under Section 564(b)(1) of the Act, 21 U.S.C. section 360bbb-3(b)(1), unless the authorization is terminated or revoked.  Performed at [facility], [address], [place], [place] [address]          Radiology Studies: No results found.      Scheduled Meds:  [START ON 05/19/2021] levothyroxine  37.5 mcg Intravenous Daily   pantoprazole (PROTONIX) IV  40 mg Intravenous Q12H   Continuous Infusions:  dextrose 5% lactated ringers     valproate sodium 125 mg (05/18/21 0812)     LOS: 2 days   Time spent: Greater than 50% of this time was spent in counseling,  explanation of diagnosis, planning of further management, and coordination of care.   Voice Recognition Reubin Milan dictation system was used to create this note, attempts have been made to correct errors. Please contact the author with questions and/or clarifications.   Albertine Grates, MD PhD FACP [facility]  Available via Epic secure chat 7am-7pm for nonurgent issues Please page for urgent issues To page the attending provider between 7A-7P or the covering  provider during after hours 7P-7A, please log into the web site [email or web address] and access using universal [place] password for that web site. If you do not have the password, please call the hospital operator.    05/18/2021, 3:07 PM

## 2021-05-18 NOTE — Consult Note (Signed)
Consultation Note Date: 05/18/2021   Patient Name: Kristi Webb  DOB: [birth year]  MRN: [id number]  Age / Sex: 87 y.o., female  PCP: Vevelyn Francois, NP Referring Physician: Florencia Reasons, MD  Reason for Consultation: Establishing goals of care  HPI/Patient Profile: 87 y.o. female  with past medical history of advanced Alzheimer's dementia, hypertension, thyroid disease, seizures, prior subdural hematoma presented to the ED on 05/16/21 from home by family with concerns for rectal bleeding. On initial ED evaluation patient severely hypotensive with systolic 03Y associated with lethargy.  Patient was found to have a large amount of bright rectal bleeding and had waxing and waning alertness.  Patient quickly was transfused with 2 units of emergency release blood and started on fluid boluses. After discussion with GI providers family decided continue IV fluids and nonaggressive modes of treatment as patient had shown  improvement. Patient was admitted on 05/16/2021 with hemorrhagic shock, lower GIB, and advanced dementia with metabolic encephalopathy.   Clinical Assessment and Goals of Care: I have reviewed medical records including EPIC notes, labs, and imaging. Received report from primary RN - no acute concerns other than tachycardia - attending aware. RN reports patient is minimally interactive, nonverbal, and not eating or drinking.    Went to visit patient at bedside - no family/visitors present. Patient was lying in bed asleep - she moves her head to voice/gentle touch; however, does not open her eyes and promptly falls back asleep. No signs or non-verbal gestures of pain or discomfort noted. No respiratory distress, increased work of breathing, or secretions noted. Patient appears frail and cachetic.   Met with daughter/legal guardian/Kristi Webb and granddaughter/Kristi Webb via phone to discuss diagnosis, prognosis, GOC, EOL  wishes, disposition, and options.  I introduced Palliative Medicine as specialized medical care for people living with serious illness. It focuses on providing relief from the symptoms and stress of a serious illness. The goal is to improve quality of life for both the patient and the family.  We discussed a brief life review of the patient as well as functional and nutritional status. Patient is divorced and has 4 children - two are still living. The patient has lived with her daughter Kristi Webb for 10 years. Kristi Webb states she has legal custody/guardianship of the patient. Kristi Webb explains the patient has been bedbound for 10 years, but was awake alert, could move her arms and feed herself; however, as of 1.5 years ago has lost these functions and can no longer move her arms, feed or turn herself, become incontinent, and has had mental status decline - she is total care. Kristi Webb has a caregiver to assist with caring for the patient while she works that comes 7 days per week for 6-7 hours per day. Kristi Webb expresses needing more assistance caring for the patient as her needs have increased. Kristi Webb states the patient's appetite has declined over time, but has been noticeably different since last week when the patient has had "throat problems", explained as having a hard time swallowing. They saw a PCP for  this issue who prescribed antibiotics and numbing medications, which the family states was minimally helpful. When asked how long ago the patient was diagnosed with dementia, Kristi Webb states it must have been before 10 years ago when she went to live with the patient.   Patient's history is significant for gradually worsening progressive symptoms of dementia.  We discussed patient's current illness and what it means in the larger context of patient's on-going co-morbidities.  Conservative management vs a more aggressive approach with invasive procedures discussed in context of GIB. Education provided that dementia is  a non-curable, progressive, terminal disease underlying the patient's current acute medical conditions.. Dementia stages were discussed. Expressed concern over patient's incremental decline in functional status which are in line with dementia disease trajectory. We reviewed specific indicators of end stage dementia, including inability to communicate, bed bound/non-ambulatory status, decreased oral intake, swallowing dysfunction, and incontinence of bowel/bladder. Natural disease trajectory and expectations at EOL were discussed. I attempted to elicit values and goals of care important to the patient. The difference between aggressive medical intervention and comfort care was considered in light of the patient's goals of care. The family seemed surprised to hear the patient was at end stages of dementia, but were understanding considering her current bedbound, non verbal state. We reviewed that decreased oral intake is a poor prognostic indicator. Prognostication was reviewed. We talked about transition to comfort measures in house and what that would entail inclusive of medications to control pain, dyspnea, agitation, nausea, itching, and hiccups. We discussed stopping all unnecessary measures such as blood draws, needle sticks, oxygen, antibiotics, CBGs/insulin, cardiac monitoring, and frequent vital signs.  Provided education and counseling at length on the philosophy and benefits of hospice care. Discussed that it offers a holistic approach to care in the setting of end-stage illness, and is about supporting the patient where they are allowing nature to take it's course. Discussed the hospice team includes RNs, physicians, social workers, and chaplains. They can provide personal care, support for the family, and help keep patient out of the hospital. Reviewed they offer DME. Residential vs home hospice was discussed in detail.   Discussed that patient is at high risk for decline - encouraged family to think  if they would want to pursue more aggressive interventions vs transition to comfort if that happens.   We discussed that evidence has shown that PEG tubes in patients with advanced dementia do not increase survival, prevent aspiration, or improve wound healing.  They can promote isolation and use of restraints leading to increased potential for pressure ulcers. Use of PEG tubes is not medically recommended in this population; rather, careful hand feeding with aspiration precautions has been shown to provide the best quality of life.   At this time, family would like to continue conservative management for GIB without invasive interventions. They request time to process and discuss information given to them today in regards to transition to comfort care if decline in house and if they would want discharge with hospice.   Hospice and Palliative Care services outpatient were explained and offered.  Advance directives, artificial feeding and hydration, and rehospitalization were considered and discussed.  Discussed with family the importance of continued conversation with each other and the medical providers regarding overall plan of care and treatment options, ensuring decisions are within the context of the patient's values and GOCs.    Questions and concerns were addressed. The patient/family was encouraged to call with questions and/or concerns. PMT number was provided.  Primary Decision Maker: Kristi Webb Smyser/daughter/Legal guardian - requested Kristi Webb bring copy of paperwork     SUMMARY OF RECOMMENDATIONS   Continue conservative medical treatment without invasive procedures. If patient declines, family considering transition to comfort measures vs pursuing more aggressive interventions Continue DNR/DNI as previously documented Patient's history is significant for gradually worsening progressive symptoms of dementia. Patient is likely approaching end of life and would be hospice appropriate.  Family considering discharging patient home with hospice but they requested time to process and discuss information given to them today - they will call PMT today if any decisions are made; otherwise, are agreeable for PMT to reach out again tomorrow  PMT will continue to follow and support holistically  Code Status/Advance Care Planning: DNR  Palliative Prophylaxis:  Aspiration, Bowel Regimen, Delirium Protocol, Frequent Pain Assessment, Oral Care, and Turn Reposition  Additional Recommendations (Limitations, Scope, Preferences): Full Scope Treatment and No Surgical Procedures  Psycho-social/Spiritual:  Desire for further Chaplaincy support:no Created space and opportunity for patient and family to express thoughts and feelings regarding patient's current medical situation.  Emotional support and therapeutic listening provided.  Prognosis:  Poor in the setting of advanced age, end stage dementia, and multiple comorbidities  Discharge Planning: To Be Determined      Primary Diagnoses: Present on Admission:  Acute hypernatremia  Alzheimer's dementia without behavioral disturbance ([place])  Essential hypertension  Hypothyroidism  GIB (gastrointestinal bleeding)   I have reviewed the medical record, interviewed the patient and family, and examined the patient. The following aspects are pertinent.  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   SDH (subdural hematoma) (HCC)    b/l 11/2011 at [facility] placed on keppra 250 mg bid    Seizure ([place])    Thrush    Thyroid disease    Social History   Socioeconomic History   Marital status: Divorced    Spouse name: N/A   Number of children: 3   Years of education: HS grad   Highest education level: Not on file  Occupational History   Occupation: Former Water engineer [employer]  Tobacco Use   Smoking status: Former     Packs/day: 1.00    Years: 40.00    Pack years: 40.00    Types: Cigarettes   Smokeless tobacco: Never  Vaping Use   Vaping Use: Never used  Substance and Sexual Activity   Alcohol use: No   Drug use: Never   Sexual activity: Never  Other Topics Concern   Not on file  Social History Narrative   Ms. Danielson' daughter Jocelyn Lamer recently took over custody of her mother in court. As of last Saturday (05/24/12) Ms. Cathers is living with this daughter. The patient was previously living with a female roommate for 10-12 years who did not allow the family to see the patient for the last 2 years.  Widowed, 4 children, 2 boys 2 girls.  Several grandchildren and several great grandchildren.  High school graduate worked in the [employer]          04/16/18- Caffeine: 2-3 cups weekly   Social Determinants of Health   Financial Resource Strain: Not on file  Food Insecurity: Not on file  Transportation Needs: Not on file  Physical Activity: Not on file  Stress: Not on file  Social Connections: Not on file   Family  History  Problem Relation Age of Onset   Hypertension Other        daughter 1(living)   Suicidality Other        daughter 2   Seizures Neg Hx    Scheduled Meds:  [START ON 05/19/2021] levothyroxine  37.5 mcg Intravenous Daily   pantoprazole (PROTONIX) IV  40 mg Intravenous Q12H   Continuous Infusions:  valproate sodium 125 mg (05/18/21 0812)   PRN Meds:.morphine injection, ondansetron **OR** ondansetron (ZOFRAN) IV Medications Prior to Admission:  Prior to Admission medications   Medication Sig Start Date End Date Taking? Authorizing Provider  acetaminophen (TYLENOL) 500 MG tablet Take 500-1,000 mg by mouth every 6 (six) hours as needed for mild pain, moderate pain or fever.   Yes [provider]  Ascorbic Acid (VITAMIN C) 1000 MG tablet Take 1,000 mg by mouth daily.   Yes [provider]  aspirin 81 MG tablet Take 81 mg by mouth daily.   Yes [provider]  CALCIUM PO Take 600 mg by mouth daily.   Yes [provider]  cetirizine (ZYRTEC) 10 MG tablet Take 1 tablet (10 mg total) by mouth daily. 07/27/20 07/27/21 Yes King, Diona Foley, NP  Cholecalciferol (VITAMIN D3) 10 MCG (400 UNIT) CAPS Take 10 mcg by mouth daily.   Yes [provider]  divalproex (DEPAKOTE SPRINKLES) 125 MG capsule Take 1 capsule (125 mg total) by mouth 2 (two) times daily. 05/09/20  Yes Vevelyn Francois, NP  donepezil (ARICEPT) 10 MG tablet Take 1 tablet (10 mg total) by mouth at bedtime. 10/27/20  Yes Vevelyn Francois, NP  levothyroxine (SYNTHROID) 75 MCG tablet Take 1 tablet (75 mcg total) by mouth daily. 10/27/20 10/27/21 Yes King, Diona Foley, NP  memantine (NAMENDA) 5 MG tablet Take 1 tablet (5 mg total) by mouth daily. 10/27/20  Yes Vevelyn Francois, NP  Multiple Vitamins-Minerals (MULTIVITAMIN PO) Take 1 capsule by mouth daily. Silver multivitamin   Yes [provider]   No Known Allergies Review of Systems  Unable to perform ROS: Dementia   Physical Exam Vitals and nursing note reviewed.  Constitutional:      General: She is not in acute distress.    Appearance: She is cachectic. She is ill-appearing.  Pulmonary:     Effort: No respiratory distress.  Skin:    General: Skin is warm and dry.  Neurological:     Mental Status: She is lethargic.     Motor: Weakness present.  Psychiatric:        Speech: She is noncommunicative.    Vital Signs: BP 99/66 (BP Location: Right Arm)   Pulse (!) 122   Temp 98.4 F (36.9 C) (Axillary)   Resp 20   Wt 41.7 kg   SpO2 98%   BMI 13.98 kg/m  Pain Scale: Faces   Pain Score: Asleep   SpO2: SpO2: 98 % O2 Device:SpO2: 98 % O2 Flow Rate: .O2 Flow Rate (L/min): 0 L/min  IO: Intake/output summary:  Intake/Output Summary (Last 24 hours) at 05/18/2021 1050 Last data filed at 05/18/2021 0418 Gross per 24 hour  Intake --  Output 400 ml  Net -400 ml    LBM: Last BM Date:  05/17/21 Baseline Weight: Weight: 41.7 kg Most recent weight: Weight: 41.7 kg     Palliative Assessment/Data: PPS 10%     Time In: 1100 Time Out: 1240 Time Total: 100 minutes  Greater than 50%  of this time was spent counseling and coordinating  care related to the above assessment and plan.  Signed by: Lin Landsman, NP   Please contact Palliative Medicine Team phone at [phone] for questions and concerns.  For individual provider: See Shea Evans

## 2021-05-18 NOTE — Progress Notes (Signed)
  Speech Language Pathology Treatment: Dysphagia  Patient Details Name: EMANUEL CAMPOS MRN: [id number] DOB: Sep 07, 1932 Today's Date: 05/18/2021 Time: 6967-8938 SLP Time Calculation (min) (ACUTE ONLY): 9 min  Assessment / Plan / Recommendation Clinical Impression  Pt's mentation and lethargy continues to significantly impact ability to safely consume POs. Max verbal and tactile stimulation cues ultimately awakened pt after which she orally accepted ice chip x1 and manipulated briefly. She was subsequently unable to sustain attention/adequate alertness and ice chip observed to have melted in R buccal cavity. Wet vocal quality noted when pt spontaneously yawned. Unable to elicit volitional cough. Trials terminated at that time. Recommend pt to remain NPO with ST to continue f/u for PO readiness. RN reports that care team will be discussing GOC with family and if short term alternative means of nutrition is desired.    HPI HPI: Pt is an 87 yo female presenting with hemorrhagic shock 2/2 GIB. Per family, pt eats soft, bite-sized pieces of food at baseline. MBS March 2015 with no aspiration. Mild impact on oral phase from cognition but regular solids/thin liquids recommended at that time. PMH includes: dementia, FTT, SDH, seizure, thyroid disease      SLP Plan  Continue with current plan of care       Recommendations  Diet recommendations: NPO Medication Administration: Via alternative means                Oral Care Recommendations: Oral care QID Follow up Recommendations: Other (comment) (TBD) SLP Visit Diagnosis: Dysphagia, unspecified (R13.10) Plan: Continue with current plan of care       GO               Avie Echevaria, MA, CCC-SLP Acute Rehabilitation Services Office Number: [phone]  Paulette Blanch 05/18/2021, 12:54 PM

## 2021-05-19 LAB — BASIC METABOLIC PANEL
Anion gap: 5 (ref 5–15)
BUN: 5 mg/dL — ABNORMAL LOW (ref 8–23)
CO2: 27 mmol/L (ref 22–32)
Calcium: 8.4 mg/dL — ABNORMAL LOW (ref 8.9–10.3)
Chloride: 114 mmol/L — ABNORMAL HIGH (ref 98–111)
Creatinine, Ser: 0.48 mg/dL (ref 0.44–1.00)
GFR, Estimated: >60 mL/min (ref >60)
Glucose, Bld: 123 mg/dL — ABNORMAL HIGH (ref 70–99)
Potassium: 3.5 mmol/L (ref 3.5–5.1)
Sodium: 146 mmol/L — ABNORMAL HIGH (ref 135–145)

## 2021-05-19 MED ORDER — POLYVINYL ALCOHOL 1.4 % OP SOLN
1.0000 [drp] | Freq: Four times a day (QID) | OPHTHALMIC | Status: DC | PRN
  Filled 2021-05-19: qty 15

## 2021-05-19 MED ORDER — HALOPERIDOL LACTATE 2 MG/ML PO CONC
2.0000 mg | Freq: Four times a day (QID) | ORAL | Status: DC | PRN
  Filled 2021-05-19: qty 1

## 2021-05-19 MED ORDER — GLYCOPYRROLATE 0.2 MG/ML IJ SOLN: 0.2000 mg | INTRAMUSCULAR | Status: DC | PRN

## 2021-05-19 MED ORDER — ONDANSETRON 4 MG PO TBDP: 4.0000 mg | ORAL_TABLET | Freq: Four times a day (QID) | ORAL | Status: DC | PRN

## 2021-05-19 MED ORDER — MORPHINE SULFATE (PF) 2 MG/ML IV SOLN: 2.0000 mg | INTRAVENOUS | Status: DC | PRN

## 2021-05-19 MED ORDER — GLYCOPYRROLATE 1 MG PO TABS
1.0000 mg | ORAL_TABLET | ORAL | Status: DC | PRN
  Filled 2021-05-19: qty 1

## 2021-05-19 MED ORDER — POTASSIUM CHLORIDE 10 MEQ/100ML IV SOLN
10.0000 meq | INTRAVENOUS | Status: DC
  Administered 2021-05-19: 10 meq via INTRAVENOUS
  Filled 2021-05-19: qty 100

## 2021-05-19 MED ORDER — MIDAZOLAM HCL 2 MG/2ML IJ SOLN: 0.5000 mg | INTRAMUSCULAR | Status: DC | PRN

## 2021-05-19 MED ORDER — ACETAMINOPHEN 325 MG PO TABS: 650.0000 mg | ORAL_TABLET | Freq: Four times a day (QID) | ORAL | Status: DC | PRN

## 2021-05-19 MED ORDER — HALOPERIDOL LACTATE 5 MG/ML IJ SOLN: 2.0000 mg | Freq: Four times a day (QID) | INTRAMUSCULAR | Status: DC | PRN

## 2021-05-19 MED ORDER — BISACODYL 10 MG RE SUPP
10.0000 mg | Freq: Every day | RECTAL | Status: AC
Start: 2021-05-19 — End: 2021-05-21
  Administered 2021-05-19: 10 mg via RECTAL
  Filled 2021-05-19: qty 1

## 2021-05-19 MED ORDER — ACETAMINOPHEN 650 MG RE SUPP: 650.0000 mg | Freq: Four times a day (QID) | RECTAL | Status: DC | PRN

## 2021-05-19 MED ORDER — ONDANSETRON HCL 4 MG/2ML IJ SOLN: 4.0000 mg | Freq: Four times a day (QID) | INTRAMUSCULAR | Status: DC | PRN

## 2021-05-19 MED ORDER — HALOPERIDOL 1 MG PO TABS
2.0000 mg | ORAL_TABLET | Freq: Four times a day (QID) | ORAL | Status: DC | PRN
  Filled 2021-05-19: qty 2

## 2021-05-19 MED ORDER — BIOTENE DRY MOUTH MT LIQD
15.0000 mL | Freq: Two times a day (BID) | OROMUCOSAL | Status: DC
  Administered 2021-05-19 – 2021-05-22 (×6): 15 mL via TOPICAL

## 2021-05-19 NOTE — Progress Notes (Signed)
This chaplain responded to PMT consult for EOL spiritual care and prayer.  Family is not at the bedside.   The chaplain is present and observant in the peaceful space with the Pt. The Pt. appears to be resting comfortably. The chaplain shared prayer and love with the Pt.

## 2021-05-19 NOTE — Progress Notes (Signed)
  Speech Language Pathology Treatment: Dysphagia  Patient Details Name: Kristi Webb MRN: [id number] DOB: Apr 04, 1932 Today's Date: 05/19/2021 Time: 9563-8756 SLP Time Calculation (min) (ACUTE ONLY): 10 min  Assessment / Plan / Recommendation Clinical Impression  Note that family has decided to pursue full comfort care with plans to take pt home with hospice services. Diet has already been liberalized to allow for comfort feeds, although with SLP input requested. Although she is alert this afternoon and keeps her eyes opened, she is not any more interactive with PO trials. She will still open her mouth upon presentation of spoon, but makes no active attempts to close her lips of accept the bolus off the spoon. Given that she was more alert, SLP tried to passively offer ice chips and thin liquids, but pt orally held them until they were suctioned back out of her mouth.   In considering pt's comfort, it may be most appropriate to continue with frequent, thorough oral care to maintain moisture in her oral mucosa. Would not necessarily provide POs unless pt is showing active signs of acceptance when presented with a spoon. If her oral cavity starts to look dry, small amounts of water or thin liquids could also be provided via swab. Will continue to follow acutely to try to provide more family training as family was not present this afternoon.    HPI HPI: Pt is an 87 yo female presenting with hemorrhagic shock 2/2 GIB. Per family, pt eats soft, bite-sized pieces of food at baseline. MBS March 2015 with no aspiration. Mild impact on oral phase from cognition but regular solids/thin liquids recommended at that time. PMH includes: dementia, FTT, SDH, seizure, thyroid disease      SLP Plan  Continue with current plan of care       Recommendations  Diet recommendations: Other(comment) (comfort feeds per MD, although would only offer if accepting) Medication Administration: Via alternative means                 Oral Care Recommendations: Oral care QID Follow up Recommendations: Other (comment) (plan for home with hospice) SLP Visit Diagnosis: Dysphagia, unspecified (R13.10) Plan: Continue with current plan of care       GO                Mahala Menghini., M.A. CCC-SLP Acute Rehabilitation Services Pager [phone] Office [phone]  05/19/2021, 3:20 PM

## 2021-05-19 NOTE — TOC Initial Note (Signed)
Transition of Care (TOC) - Initial/Assessment Note  Donn Pierini RN, BSN Transitions of Care Unit 4E- RN Case Manager See Treatment Team for direct phone #    Patient Details  Name: Kristi Webb MRN: [id number] Date of Birth: August 14, 1932  Transition of Care [facility]) CM/SW Contact:    Darrold Span, RN Phone Number: 05/19/2021, 3:44 PM  Clinical Narrative:                 Received referral from [facility] for home with hospice- family choice for home hospice needs is [facility].  Spoke with hospital liaison [name] regarding Home Hospice referral- she will f/u with family and begin hospice eligibility.  DME needs will be ordered per [facility].   Patient to transition home pending confirmed Hospice arrangements and delivery of DME to the home.    Expected Discharge Plan: Home w Hospice Care Barriers to Discharge: Other (must enter comment) (Hospice referral and DME delivery)   Patient Goals and CMS Choice Patient states their goals for this hospitalization and ongoing recovery are:: comfort care CMS Medicare.gov Compare Post Acute Care list provided to:: Patient Represenative (must comment) Choice offered to / list presented to : Adult Children  Expected Discharge Plan and Services Expected Discharge Plan: Home w Hospice Care   Discharge Planning Services: CM Consult Post Acute Care Choice: Hospice Living arrangements for the past 2 months: Single Family Home                 DME Arranged: Hospice Equipment Package Others DME Agency: [employer] Date DME Agency Contacted: 05/19/21   Representative spoke with at DME Agency: Per [facility]   [facility] Agency: [facility] Date [facility] Agency Contacted: 05/19/21 Time HH Agency Contacted: 1215 Representative spoke with at [facility] Agency: [name]  Prior Living Arrangements/Services Living arrangements for the past 2 months: Single Family Home Lives with:: Adult Children Patient language and need for  interpreter reviewed:: Yes        Need for Family Participation in Patient Care: Yes (Comment) Care giver support system in place?: Yes (comment) Current home services: DME Criminal Activity/Legal Involvement Pertinent to Current Situation/Hospitalization: No - Comment as needed  Activities of Daily Living Home Assistive Devices/Equipment: None ADL Screening (condition at time of admission) Patient's cognitive ability adequate to safely complete daily activities?: No Is the patient deaf or have difficulty hearing?: Yes Does the patient have difficulty seeing, even when wearing glasses/contacts?: Yes Does the patient have difficulty concentrating, remembering, or making decisions?: Yes Patient able to express need for assistance with ADLs?: No Does the patient have difficulty dressing or bathing?: Yes Independently performs ADLs?: No Communication: Dependent Is this a change from baseline?: Pre-admission baseline Dressing (OT): Dependent Is this a change from baseline?: Pre-admission baseline Grooming: Dependent Is this a change from baseline?: Pre-admission baseline Feeding: Dependent Is this a change from baseline?: Pre-admission baseline Toileting: Dependent Is this a change from baseline?: Pre-admission baseline In/Out Bed: Dependent Walks in Home: Dependent Is this a change from baseline?: Pre-admission baseline Does the patient have difficulty walking or climbing stairs?: Yes Weakness of Legs: Both Weakness of Arms/Hands: Both  Permission Sought/Granted                  Emotional Assessment         Alcohol / Substance Use: Not Applicable Psych Involvement: No (comment)  Admission diagnosis:  Hemorrhagic shock (HCC) [R57.8] GIB (gastrointestinal bleeding) [K92.2] Rectal bleed [K62.5] Patient Active Problem List  Diagnosis Date Noted   Palliative care by specialist    Goals of care, counseling/discussion    DNR (do not resuscitate)    DNI (do not  intubate)    Advanced directives, counseling/discussion    Encounter for hospice care discussion    Concern about end of life    Hemorrhagic shock (HCC)    Shock circulatory (HCC) 05/16/2021   Lower GI bleed 05/16/2021   GIB (gastrointestinal bleeding) 05/16/2021   Allergic rhinitis 10/30/2019   Seizure disorder (HCC) 10/30/2019   Complex partial seizure with impairment of consciousness at onset [facility]) 04/17/2018   Hypokalemia 02/04/2014   Aspiration pneumonia (HCC) 12/24/2013   FUO (fever of unknown origin) 12/23/2013   Personal history of tobacco use, presenting hazards to health 04/29/2013   Shoulder joint pain 01/30/2013   Disorder of bone and cartilage 12/01/2012   Hx traumatic fracture 12/01/2012   Problems influencing health status 12/01/2012   Other specified diseases of blood and blood-forming organs 09/03/2012   Aspiration pneumonitis (HCC) 07/06/2012   Malnutrition (HCC) 05/29/2012   Acute hypernatremia 05/28/2012   Leukocytosis 05/28/2012   Sacral decubitus ulcer, stage II (HCC) 05/28/2012   Thyroid disorder 05/28/2012   Thrush 05/28/2012   FTT (failure to thrive) in adult 05/28/2012   Tachypnea 05/28/2012   Tachycardia 05/28/2012   Status post craniotomy 05/19/2012   Altered mental status 12/27/2011   Dysphagia 12/27/2011   Pressure ulcer, stage 2 (HCC) 12/05/2011   Alzheimer's dementia without behavioral disturbance (HCC) 11/29/2011   Hypothyroidism 11/29/2011   Essential hypertension 11/29/2011   Subdural hematoma (HCC) 11/29/2011   Constipation 09/14/2011   Heartburn 07/06/2011   PCP:  [name], NP Pharmacy:   [facility]- [place], [place] - [address] [place] [address] Phone: [phone] Fax: [phone]  [facility] [place], [place] - [address] [place] [address] Phone: [phone] Fax: [phone]     Social Determinants of Health  (SDOH) Interventions    Readmission Risk Interventions No flowsheet data found.

## 2021-05-19 NOTE — Progress Notes (Signed)
[facility])  Referral received for hospice services at home.  Visited pt at the bedside, no family present.   Left message for her dtr Lessie Dings, awaiting return call.  Wallis Bamberg RN, BSN, CCRN [facility] Liaison

## 2021-05-19 NOTE — Progress Notes (Signed)
Daily Progress Note   Patient Name: Kristi Webb       Date: 05/19/2021 DOB: July 03, 1932  Age: 87 y.o. MRN#: [id number] Attending Physician: Albertine Grates, MD Primary Care Physician: [name], NP Admit Date: 05/16/2021  Reason for Consultation/Follow-up: Disposition, Establishing goals of care, Non pain symptom management, Pain control, Psychosocial/spiritual support, and Terminal Care  Subjective: Chart review performed. Received report from primary RN - no acute concerns.  Went to visit patient at bedside - no family/visitors present. Patient was lying in bed awake - NT was bathing her. Patient does not respond to questions. No signs or non-verbal gestures of pain or discomfort noted. No respiratory distress, increased work of breathing, or secretions noted. She is frail and cachectic appearing.    Called patient's daughter/Vickye and granddaughter/Sharuby to offer support and continue GOC discussions. [place] state they had a meeting with other family members last night and have decided on transitioning the patient to full comfort care "immediately." We reviewed again and discussed the transition to comfort measures in house and what that would entail inclusive of medications to control pain, dyspnea, agitation, nausea, itching, and hiccups. We discussed stopping all unnecessary measures such as blood draws, needle sticks, oxygen, antibiotics, CBGs/insulin, cardiac monitoring, and frequent vital signs. We specifically discussed protonix, which the family would like to continue until discharge.   At family's request, provided education and counseling at length on the philosophy and benefits of hospice care. Discussed that it offers a holistic approach to care in the setting of end-stage  illness, and is about supporting the patient where they are allowing nature to take it's course. Discussed the hospice team includes RNs, physicians, social workers, and chaplains. They can provide personal care, support for the family, and help keep patient out of the hospital. They understand hospice can provide DME - they request hospital bed. Prognostication reviewed again at family's request. All hospice questions were answered to the best of my ability. Lessie Dings states she had researched Physicist, medical - both state they would like to use their services.   Emotional support and therapeutic listening provided.  All questions and concerns addressed. Encouraged to call with questions and/or concerns. PMT number previously provided.  Length of Stay: 3  Current Medications: Scheduled Meds:   bisacodyl  10 mg Rectal Daily  levothyroxine  37.5 mcg Intravenous Daily   pantoprazole (PROTONIX) IV  40 mg Intravenous Q12H    Continuous Infusions:  dextrose 5% lactated ringers 75 mL/hr at 05/19/21 0028   potassium chloride     valproate sodium 125 mg (05/19/21 0935)    PRN Meds: morphine injection, ondansetron **OR** ondansetron (ZOFRAN) IV  Physical Exam Vitals and nursing note reviewed.  Constitutional:      General: She is not in acute distress.    Appearance: She is cachectic. She is ill-appearing.  Pulmonary:     Effort: No respiratory distress.  Skin:    General: Skin is cool and dry.  Neurological:     Mental Status: She is lethargic.     Motor: Weakness present.  Psychiatric:        Speech: She is noncommunicative.            Vital Signs: BP (!) 107/53 (BP Location: Right Arm)   Pulse (!) 58   Temp 98.2 F (36.8 C) (Axillary)   Resp 13   Wt 41.7 kg   SpO2 100%   BMI 13.98 kg/m  SpO2: SpO2: 100 % O2 Device: O2 Device: Room Air O2 Flow Rate: O2 Flow Rate (L/min): 0 L/min  Intake/output summary:  Intake/Output Summary (Last 24 hours) at 05/19/2021 1057 Last data filed  at 05/19/2021 0617 Gross per 24 hour  Intake 1512.71 ml  Output --  Net 1512.71 ml   LBM: Last BM Date: 05/18/21 Baseline Weight: Weight: 41.7 kg Most recent weight: Weight: 41.7 kg       Palliative Assessment/Data: PPS 10%    Flowsheet Rows    Flowsheet Row Most Recent Value  Intake Tab   Referral Department Hospitalist  Unit at Time of Referral ER  Palliative Care Primary Diagnosis Neurology  Date Notified 05/16/21  Palliative Care Type New Palliative care  Reason for referral Clarify Goals of Care  Date of Admission 05/16/21  Date first seen by Palliative Care 05/18/21  # of days Palliative referral response time 2 Day(s)  # of days IP prior to Palliative referral 0  Clinical Assessment   Psychosocial & Spiritual Assessment   Palliative Care Outcomes   Patient/Family meeting held? Yes  Who was at the meeting? daughter, granddaughter  Palliative Care Outcomes Clarified goals of care, Counseled regarding hospice, Provided psychosocial or spiritual support  Patient/Family wishes: Interventions discontinued/not started  Tube feedings/TPN, PEG, Transfer out of ICU, Vasopressors, Hemodialysis       Patient Active Problem List   Diagnosis Date Noted   Palliative care by specialist    Goals of care, counseling/discussion    DNR (do not resuscitate)    DNI (do not intubate)    Advanced directives, counseling/discussion    Encounter for hospice care discussion    Concern about end of life    Hemorrhagic shock (HCC)    Shock circulatory (HCC) 05/16/2021   Lower GI bleed 05/16/2021   GIB (gastrointestinal bleeding) 05/16/2021   Allergic rhinitis 10/30/2019   Seizure disorder (HCC) 10/30/2019   Complex partial seizure with impairment of consciousness at onset [facility]) 04/17/2018   Hypokalemia 02/04/2014   Aspiration pneumonia (HCC) 12/24/2013   FUO (fever of unknown origin) 12/23/2013   Personal history of tobacco use, presenting hazards to health 04/29/2013   Shoulder  joint pain 01/30/2013   Disorder of bone and cartilage 12/01/2012   Hx traumatic fracture 12/01/2012   Problems influencing health status 12/01/2012   Other specified diseases of blood and blood-forming  organs 09/03/2012   Aspiration pneumonitis (HCC) 07/06/2012   Malnutrition (HCC) 05/29/2012   Acute hypernatremia 05/28/2012   Leukocytosis 05/28/2012   Sacral decubitus ulcer, stage II (HCC) 05/28/2012   Thyroid disorder 05/28/2012   Thrush 05/28/2012   FTT (failure to thrive) in adult 05/28/2012   Tachypnea 05/28/2012   Tachycardia 05/28/2012   Status post craniotomy 05/19/2012   Altered mental status 12/27/2011   Dysphagia 12/27/2011   Pressure ulcer, stage 2 (HCC) 12/05/2011   Alzheimer's dementia without behavioral disturbance (HCC) 11/29/2011   Hypothyroidism 11/29/2011   Essential hypertension 11/29/2011   Subdural hematoma (HCC) 11/29/2011   Constipation 09/14/2011   Heartburn 07/06/2011    Palliative Care Assessment & Plan   Patient Profile: 87 y.o. female  with past medical history of advanced Alzheimer's dementia, hypertension, thyroid disease, seizures, prior subdural hematoma presented to the ED on 05/16/21 from home by family with concerns for rectal bleeding. On initial ED evaluation patient severely hypotensive with systolic 40s associated with lethargy.  Patient was found to have a large amount of bright rectal bleeding and had waxing and waning alertness.  Patient quickly was transfused with 2 units of emergency release blood and started on fluid boluses. After discussion with GI providers family decided continue IV fluids and nonaggressive modes of treatment as patient had shown  improvement. Patient was admitted on 05/16/2021 with hemorrhagic shock, lower GIB, and advanced dementia with metabolic encephalopathy.   Assessment: Hemmorrhagic shock Hypernatremia Hypokalemia QTC prolongation Seizure disorder with history of subdural hematoma Failure to  thrive Terminal care  Recommendations/Plan: Initiated full comfort measures Continue DNR/DNI as previously documented - durable DNR form completed and placed in shadow chart. Copy was made and will be scanned into Vynca/ACP tab Family would like patient discharged home with hospice services as soon as DME is delivered - requested [facility] TOC consult for: [facility] home hospice referral Chaplain consult for: family's request for chaplain prayer with patient Added orders for EOL symptom management and to reflect full comfort measures, as well as discontinued orders that were not focused on comfort Continue protonix until discharge Recommend continuing valproate for seizure prophylaxis as this is considered a comfort measure Continue palliative wound care SLP consulted for: recommendations for comfort feeds, if any (patient may no longer accept anything PO) Unrestricted visitation orders were placed per current [place] COVID19 EOL visitation policy  Nursing to provide frequent assessments and administer PRN medications as clinically necessary to ensure EOL comfort PMT will continue to follow holistically   Goals of Care and Additional Recommendations: Limitations on Scope of Treatment: Full Comfort Care  Code Status:    Code Status Orders  (From admission, onward)           Start     Ordered   05/16/21 1948  Do not attempt resuscitation (DNR)  Continuous       Question Answer Comment  In the event of cardiac or respiratory ARREST Do not call a "code blue"   In the event of cardiac or respiratory ARREST Do not perform Intubation, CPR, defibrillation or ACLS   In the event of cardiac or respiratory ARREST Use medication by any route, position, wound care, and other measures to relive pain and suffering. May use oxygen, suction and manual treatment of airway obstruction as needed for comfort.      05/16/21 1951           Code Status History     Date Active Date  Inactive Code Status Order  ID Comments User Context   05/16/2021 1703 05/16/2021 1952 DNR [id number]  Simonne Martinet, NP ED   12/23/2013 1911 12/26/2013 2030 DNR [id number]  Rama, Maryruth Bun, MD Inpatient   07/06/2012 1622 07/07/2012 2021 DNR [id number]  Darletta Moll, RN Inpatient   05/28/2012 1823 06/04/2012 2012 DNR [id number]  Annett Gula Inpatient   05/28/2012 1800 05/28/2012 1823 Full Code [id number]  Annett Gula Inpatient       Prognosis:  < 2 weeks  Discharge Planning: Home with Hospice  Care plan was discussed with primary RN, TOC, Dr. Roda Shutters, [facility] liaison, patient's family  Thank you for allowing the Palliative Medicine Team to assist in the care of this patient.   Total Time 45 minutes Prolonged Time Billed  no       Greater than 50%  of this time was spent counseling and coordinating care related to the above assessment and plan.  Haskel Khan, NP  Please contact Palliative Medicine Team phone at [phone] for questions and concerns.

## 2021-05-19 NOTE — Progress Notes (Signed)
Nutrition Brief Note  Chart reviewed.  Pt now transitioning to home hospice for end of life care.   No further nutrition interventions planned at this time.   Please consult if needed.   Vertell Limber, RD, LDN (she/her/hers) Registered Dietitian I After-Hours/Weekend Pager # in [place]

## 2021-05-19 NOTE — Progress Notes (Signed)
PROGRESS NOTE    [name]  YOV:[id number] DOB: June 08, 1932 DOA: 05/16/2021 PCP: [name], NP    No chief complaint on file.   Brief Narrative:  ABIHA Webb is a 87 y.o. female with advanced Alzheimer's dementia, hypertension, thyroid disease, seizures, prior subdural hematoma brought into the ED by family and concern for rectal hemorrhage since 1 PM today.Per ED physician family brought in a "bucket full of blood".  On initial ED evaluation patient severely hypotensive with systolic 40s associated with lethargy.  Patient was found to have a large amount of bright rectal bleeding and had waxing and waning alertness.  Patient quickly was transfused with 2 units of emergency release blood and started on fluid boluses. GI and critical care were consulted.  ED physician, PCCM and GI had discussions with family regarding critical condition, risks of anesthesia, prognosis and CODE STATUS.  Patient was DNR/DNI prior to presentation and family chose to continue the same.  Fortunately her blood pressure did improve with blood and fluid resuscitation.  Family would like to continue IV fluids and nonaggressive modes of treatment as patient has shown improvement at this time understanding risk of deterioration during the hospital stay.  Subjective:   She is awake, nonverbal, not following commands, no agitation She remains npo , awaiting for swallow eval and continue goals of care discussion She remains on hydration   Assessment & Plan:   Principal Problem:   Shock circulatory (HCC) Active Problems:   Acute hypernatremia   Alzheimer's dementia without behavioral disturbance (HCC)   Hypothyroidism   Essential hypertension   Seizure disorder (HCC)   Lower GI bleed   GIB (gastrointestinal bleeding)   Palliative care by specialist   Goals of care, counseling/discussion   DNR (do not resuscitate)   DNI (do not intubate)   Advanced directives, counseling/discussion   Encounter for  hospice care discussion   Concern about end of life   Hemorrhagic shock/rectal bleeding -Seen by GI and critical care, conservative management -Blood pressure improved after PRBC x2 units and fluids bolus, hemoglobin 14.9 this morning -no active bleed since admission, stool is brown   Hypernatremia Likely from volume depletion She received half-normal saline since admission, sodium, down from 1 60-1 50 -She is hyperchloremic, changed from half-normal saline to D5 LR, continue for another 24hrs, sodium improving, remains npo -Repeat BMP  Hypokalemia Continue to replace by iv  Advanced dementia presented with acute metabolic encephalopathy Due to hemorrhagic shock and hyponatremia Hold Aricept and Namenda Resume when mental status improves Currently continue npo, ivf, will get speech eval  QTC prolongation Keep K above 4, mag above 2 Repeat EKG to monitor QTC, order placed  Seizure disorder with history of subdural hematoma Home medication Depakote changed to IV due to unable to tolerate p.o. from altered mental status  Hypothyroidism Currently on IV Synthroid, transition to p.o. when mental status improves  Failure to thrive, total care at baseline, 50lbs weight loss in the last year Continued goals of care discussion, palliative care consulted     Skin Assessment:  I have examined the patient's skin and I agree with the wound assessment as performed by the wound care RN as outlined below:  Pressure Ulcer 05/28/12 Stage I -  Intact skin with non-blanchable redness of a localized area usually over a bony prominence. bruised looking area over left outer ankle (Active)  05/28/12 1600  Location: Ankle  Location Orientation: Left;Lateral  Staging: Stage I -  Intact skin with  non-blanchable redness of a localized area usually over a bony prominence.  Wound Description (Comments): bruised looking area over left outer ankle  Present on Admission:     Unresulted Labs (From  admission, onward)    None         DVT prophylaxis: SCDs Start: 05/16/21 1948   Code Status:DNR Family Communication: daughter at bedside on 8/17, vickye would like to be contacted day or night  Disposition:   Status is: Inpatient  Dispo: The patient is from: home              Anticipated d/c is to: TBD              Anticipated d/c date is: likely will qualify hospice service , continue goals of care discussion                 Consultants:  GI Critical care Palliative care  Procedures:  none  Antimicrobials:    Anti-infectives (From admission, onward)    None          Objective: Vitals:   05/18/21 2000 05/18/21 2354 05/19/21 0339 05/19/21 0800  BP: 131/78 (!) 133/99 117/69 (!) 107/53  Pulse: 76 79 62 (!) 58  Resp: 16 18 16 13   Temp: (!) 97.4 F (36.3 C) 97.6 F (36.4 C) 98.3 F (36.8 C) 98.2 F (36.8 C)  TempSrc: Axillary Axillary Axillary Axillary  SpO2: 100% 100% 100% 100%  Weight:        Intake/Output Summary (Last 24 hours) at 05/19/2021 0908 Last data filed at 05/19/2021 0617 Gross per 24 hour  Intake 1512.71 ml  Output --  Net 1512.71 ml   Filed Weights   05/17/21 1800  Weight: 41.7 kg    Examination:  General exam: advance dementia, nonverbal at baseline, open eyes spontaneously this morning  Respiratory system: Clear to auscultation. Respiratory effort normal. Cardiovascular system: S1 & S2 heard, RRR.  Gastrointestinal system: Abdomen is nondistended, soft and nontender.  Normal bowel sounds heard. Central nervous system: open eyes , nonverbal, does not follow commands, chronic upper extremity contractures Extremities: chronic contractures, no edema Skin: pressure ulcers presents on admission as described above Psychiatry: advanced dementia, nonverbal, does not follow commands, no agitation currently    Data Reviewed: I have personally reviewed following labs and imaging studies  CBC: Recent Labs  Lab 05/16/21 1513  05/16/21 1556 05/16/21 2002 05/17/21 0300 05/18/21 0211  WBC 6.5  --   --  6.5 5.2  NEUTROABS 2.9  --   --   --   --   HGB 12.3 13.6 14.8 14.9 14.0  HCT 41.6 40.0 46.9* 45.9 41.6  MCV 93.9  --   --  87.8 83.4  PLT 161  --   --  95* 114*    Basic Metabolic Panel: Recent Labs  Lab 05/16/21 1513 05/16/21 1556 05/16/21 2002 05/17/21 0300 05/18/21 0211 05/19/21 0236  NA 157* 160* 154* 151* 147* 146*  K 3.2* 3.3* 3.8 3.8 3.4* 3.5  CL 121* 122* 122* 121* 112* 114*  CO2 27  --  26 24 25 27   GLUCOSE 121* 130* 87 87 111* 123*  BUN 20 20 19 17 10  5*  CREATININE 0.60 0.40* 0.48 0.37* 0.48 0.48  CALCIUM 8.8*  --  8.2* 8.1* 8.7* 8.4*  MG  --   --   --  2.1  --   --   PHOS  --   --   --  2.6  --   --  GFR: Estimated Creatinine Clearance: 32.6 mL/min (by C-G formula based on SCr of 0.48 mg/dL).  Liver Function Tests: Recent Labs  Lab 05/16/21 1513  AST 24  ALT 18  ALKPHOS 69  BILITOT 0.8  PROT 6.4*  ALBUMIN 2.4*    CBG: Recent Labs  Lab 05/18/21 1220  GLUCAP 111*     Recent Results (from the past 240 hour(s))  Resp Panel by RT-PCR (Flu A&B, Covid) Nasopharyngeal Swab     Status: None   Collection Time: 05/16/21  3:37 PM   Specimen: Nasopharyngeal Swab; Nasopharyngeal(NP) swabs in vial transport medium  Result Value Ref Range Status   SARS Coronavirus 2 by RT PCR NEGATIVE NEGATIVE Final    Comment: (NOTE) SARS-CoV-2 target nucleic acids are NOT DETECTED.  The SARS-CoV-2 RNA is generally detectable in upper respiratory specimens during the acute phase of infection. The lowest concentration of SARS-CoV-2 viral copies this assay can detect is 138 copies/mL. A negative result does not preclude SARS-Cov-2 infection and should not be used as the sole basis for treatment or other patient management decisions. A negative result may occur with  improper specimen collection/handling, submission of specimen other than nasopharyngeal swab, presence of viral mutation(s)  within the areas targeted by this assay, and inadequate number of viral copies(<138 copies/mL). A negative result must be combined with clinical observations, patient history, and epidemiological information. The expected result is Negative.  Fact Sheet for Patients:  BloggerCourse.comhttps://www.fda.gov/media/152166/download  Fact Sheet for Healthcare Providers:  SeriousBroker.ithttps://www.fda.gov/media/152162/download  This test is no t yet approved or cleared by the [place] FDA and  has been authorized for detection and/or diagnosis of SARS-CoV-2 by FDA under an Emergency Use Authorization (EUA). This EUA will remain  in effect (meaning this test can be used) for the duration of the COVID-19 declaration under Section 564(b)(1) of the Act, 21 U.S.C.section 360bbb-3(b)(1), unless the authorization is terminated  or revoked sooner.       Influenza A by PCR NEGATIVE NEGATIVE Final   Influenza B by PCR NEGATIVE NEGATIVE Final    Comment: (NOTE) The Xpert Xpress SARS-CoV-2/FLU/RSV plus assay is intended as an aid in the diagnosis of influenza from Nasopharyngeal swab specimens and should not be used as a sole basis for treatment. Nasal washings and aspirates are unacceptable for Xpert Xpress SARS-CoV-2/FLU/RSV testing.  Fact Sheet for Patients: BloggerCourse.comhttps://www.fda.gov/media/152166/download  Fact Sheet for Healthcare Providers: SeriousBroker.ithttps://www.fda.gov/media/152162/download  This test is not yet approved or cleared by the [place] FDA and has been authorized for detection and/or diagnosis of SARS-CoV-2 by FDA under an Emergency Use Authorization (EUA). This EUA will remain in effect (meaning this test can be used) for the duration of the COVID-19 declaration under Section 564(b)(1) of the Act, 21 U.S.C. section 360bbb-3(b)(1), unless the authorization is terminated or revoked.  Performed at [facility], [address] [place], [place] [address]          Radiology Studies: No results  found.      Scheduled Meds:  levothyroxine  37.5 mcg Intravenous Daily   pantoprazole (PROTONIX) IV  40 mg Intravenous Q12H   Continuous Infusions:  dextrose 5% lactated ringers 75 mL/hr at 05/19/21 0028   potassium chloride     valproate sodium 125 mg (05/18/21 2249)     LOS: 3 days   Time spent: 25mins Greater than 50% of this time was spent in counseling, explanation of diagnosis, planning of further management, and coordination of care.   Voice Recognition Reubin Milan/Dragon dictation system was  used to create this note, attempts have been made to correct errors. Please contact the author with questions and/or clarifications.   Albertine Grates, MD PhD FACP [facility]  Available via Epic secure chat 7am-7pm for nonurgent issues Please page for urgent issues To page the attending provider between 7A-7P or the covering provider during after hours 7P-7A, please log into the web site [email or web address] and access using universal [place] password for that web site. If you do not have the password, please call the hospital operator.    05/19/2021, 9:08 AM

## 2021-05-20 DIAGNOSIS — Z515 Encounter for palliative care: Secondary | ICD-10-CM

## 2021-05-20 DIAGNOSIS — R578 Other shock: Secondary | ICD-10-CM

## 2021-05-20 MED ORDER — SODIUM CHLORIDE 0.9 % IV SOLN
250.0000 mg | Freq: Two times a day (BID) | INTRAVENOUS | Status: DC
  Administered 2021-05-20 – 2021-05-22 (×4): 250 mg via INTRAVENOUS
  Filled 2021-05-20 (×6): qty 2.5

## 2021-05-20 MED ORDER — LORAZEPAM 0.5 MG PO TABS: 0.5000 mg | ORAL_TABLET | Freq: Four times a day (QID) | ORAL | Status: DC | PRN

## 2021-05-20 NOTE — Progress Notes (Addendum)
PROGRESS NOTE    Kristi Webb  FYB:[id number] DOB: 01-May-1932 DOA: 05/16/2021 PCP: [name], NP    No chief complaint on file.   Brief Narrative:  Kristi Webb is a 87 y.o. female with advanced Alzheimer's dementia, hypertension, thyroid disease, seizures, prior subdural hematoma brought into the ED by family and concern for rectal hemorrhage since 1 PM today.Per ED physician family brought in a "bucket full of blood".  On initial ED evaluation patient severely hypotensive with systolic 40s associated with lethargy.  Patient was found to have a large amount of bright rectal bleeding and had waxing and waning alertness.  Patient quickly was transfused with 2 units of emergency release blood and started on fluid boluses. GI and critical care were consulted.  ED physician, PCCM and GI had discussions with family regarding critical condition, risks of anesthesia, prognosis and CODE STATUS.  Patient was DNR/DNI prior to presentation and family chose to continue the same.  Fortunately her blood pressure did improve with blood and fluid resuscitation.  Family would like to continue IV fluids and nonaggressive modes of treatment as patient has shown improvement at this time understanding risk of deterioration during the hospital stay.  Subjective:  She is on full comfort measures  She is not awake, nonverbal, not following commands, no agitation RN reports she has not been awake enough to eat today   Assessment & Plan:   Principal Problem:   Shock circulatory (HCC) Active Problems:   Acute hypernatremia   Alzheimer's dementia without behavioral disturbance (HCC)   Hypothyroidism   Essential hypertension   Seizure disorder (HCC)   Lower GI bleed   GIB (gastrointestinal bleeding)   Palliative care by specialist   Goals of care, counseling/discussion   DNR (do not resuscitate)   DNI (do not intubate)   Advanced directives, counseling/discussion   Encounter for hospice care  discussion   Concern about end of life   Hemorrhagic shock/rectal bleeding -Seen by GI and critical care, conservative management -Blood pressure improved after PRBC x2 units and fluids bolus, hemoglobin 14.9 this morning -no active bleed since admission, stool is brown -Now on comfort measures   Hypernatremia Likely from volume depletion, sodium 160 on presentation -sodium improved after hydration  -Started on comfort measures   Hypokalemia replaced by iv, improved Now on comfort measures  Advanced dementia presented with acute metabolic encephalopathy Due to hemorrhagic shock and hyponatremia Hold Aricept and Namenda Now on comfort measures   QTC prolongation Keep K above 4, mag above 2 QTC 505 on presentation, improved on repeat Now on comfort measures  Seizure disorder with history of subdural hematoma Home medication Depakote changed to IV due to unable to tolerate p.o. from altered mental status Continue antiseizure medication while on comfort measures  Hypothyroidism Currently on IV Synthroid, transition to p.o. when mental status improves Now on comfort measures  Failure to thrive, total care at baseline, 50lbs weight loss in the last year Palliative care input appreciated, patient is on comfort measures, plan to go home with home hospice once  home equipment delivered and help at home secured by daughter     Skin Assessment:  I have examined the patient's skin and I agree with the wound assessment as performed by the wound care RN as outlined below:  Pressure Ulcer 05/28/12 Stage I -  Intact skin with non-blanchable redness of a localized area usually over a bony prominence. bruised looking area over left outer ankle (Active)  05/28/12 1600  Location: Ankle  Location Orientation: Left;Lateral  Staging: Stage I -  Intact skin with non-blanchable redness of a localized area usually over a bony prominence.  Wound Description (Comments): bruised looking area  over left outer ankle  Present on Admission:     Unresulted Labs (From admission, onward)    None         DVT prophylaxis:   On full comfort measures   Code Status:DNR Family Communication: daughter at bedside on 8/17, on 8/20 vickye would like to be contacted day or night if needed   Disposition:   Status is: Inpatient  Dispo: The patient is from: home              Anticipated d/c is to: monday              Anticipated d/c date is: Home with home hospice, but home equipment delivered, help at home and secured by daughter                Consultants:  GI Critical care Palliative care  Procedures:  none  Antimicrobials:    Anti-infectives (From admission, onward)    None          Objective: Vitals:   05/18/21 2354 05/19/21 0339 05/19/21 0800 05/19/21 2158  BP: (!) 133/99 117/69 (!) 107/53 120/74  Pulse: 79 62 (!) 58 63  Resp: 18 16 13 18   Temp: 97.6 F (36.4 C) 98.3 F (36.8 C) 98.2 F (36.8 C) (!) 97.3 F (36.3 C)  TempSrc: Axillary Axillary Axillary Axillary  SpO2: 100% 100% 100% 100%  Weight:       No intake or output data in the 24 hours ending 05/20/21 1121  Filed Weights   05/17/21 1800  Weight: 41.7 kg    Examination:  General exam: advance dementia, nonverbal at baseline, not open eyes currently Respiratory system: Clear to auscultation. Respiratory effort normal. Cardiovascular system: S1 & S2 heard, RRR.  Gastrointestinal system: Abdomen is nondistended, soft and nontender.  Normal bowel sounds heard. Central nervous system: Does not open eyes, nonverbal, does not follow commands, chronic upper extremity contractures Extremities: chronic contractures, no edema Skin: pressure ulcers presents on admission as described above Psychiatry: advanced dementia, nonverbal, does not follow commands, no agitation currently    Data Reviewed: I have personally reviewed following labs and imaging studies  CBC: Recent Labs  Lab  05/16/21 1513 05/16/21 1556 05/16/21 2002 05/17/21 0300 05/18/21 0211  WBC 6.5  --   --  6.5 5.2  NEUTROABS 2.9  --   --   --   --   HGB 12.3 13.6 14.8 14.9 14.0  HCT 41.6 40.0 46.9* 45.9 41.6  MCV 93.9  --   --  87.8 83.4  PLT 161  --   --  95* 114*    Basic Metabolic Panel: Recent Labs  Lab 05/16/21 1513 05/16/21 1556 05/16/21 2002 05/17/21 0300 05/18/21 0211 05/19/21 0236  NA 157* 160* 154* 151* 147* 146*  K 3.2* 3.3* 3.8 3.8 3.4* 3.5  CL 121* 122* 122* 121* 112* 114*  CO2 27  --  26 24 25 27   GLUCOSE 121* 130* 87 87 111* 123*  BUN 20 20 19 17 10  5*  CREATININE 0.60 0.40* 0.48 0.37* 0.48 0.48  CALCIUM 8.8*  --  8.2* 8.1* 8.7* 8.4*  MG  --   --   --  2.1  --   --   PHOS  --   --   --  2.6  --   --     GFR: Estimated Creatinine Clearance: 32.6 mL/min (by C-G formula based on SCr of 0.48 mg/dL).  Liver Function Tests: Recent Labs  Lab 05/16/21 1513  AST 24  ALT 18  ALKPHOS 69  BILITOT 0.8  PROT 6.4*  ALBUMIN 2.4*    CBG: Recent Labs  Lab 05/18/21 1220  GLUCAP 111*     Recent Results (from the past 240 hour(s))  Resp Panel by RT-PCR (Flu A&B, Covid) Nasopharyngeal Swab     Status: None   Collection Time: 05/16/21  3:37 PM   Specimen: Nasopharyngeal Swab; Nasopharyngeal(NP) swabs in vial transport medium  Result Value Ref Range Status   SARS Coronavirus 2 by RT PCR NEGATIVE NEGATIVE Final    Comment: (NOTE) SARS-CoV-2 target nucleic acids are NOT DETECTED.  The SARS-CoV-2 RNA is generally detectable in upper respiratory specimens during the acute phase of infection. The lowest concentration of SARS-CoV-2 viral copies this assay can detect is 138 copies/mL. A negative result does not preclude SARS-Cov-2 infection and should not be used as the sole basis for treatment or other patient management decisions. A negative result may occur with  improper specimen collection/handling, submission of specimen other than nasopharyngeal swab, presence of  viral mutation(s) within the areas targeted by this assay, and inadequate number of viral copies(<138 copies/mL). A negative result must be combined with clinical observations, patient history, and epidemiological information. The expected result is Negative.  Fact Sheet for Patients:  BloggerCourse.comhttps://www.fda.gov/media/152166/download  Fact Sheet for Healthcare Providers:  SeriousBroker.ithttps://www.fda.gov/media/152162/download  This test is no t yet approved or cleared by the [place] FDA and  has been authorized for detection and/or diagnosis of SARS-CoV-2 by FDA under an Emergency Use Authorization (EUA). This EUA will remain  in effect (meaning this test can be used) for the duration of the COVID-19 declaration under Section 564(b)(1) of the Act, 21 U.S.C.section 360bbb-3(b)(1), unless the authorization is terminated  or revoked sooner.       Influenza A by PCR NEGATIVE NEGATIVE Final   Influenza B by PCR NEGATIVE NEGATIVE Final    Comment: (NOTE) The Xpert Xpress SARS-CoV-2/FLU/RSV plus assay is intended as an aid in the diagnosis of influenza from Nasopharyngeal swab specimens and should not be used as a sole basis for treatment. Nasal washings and aspirates are unacceptable for Xpert Xpress SARS-CoV-2/FLU/RSV testing.  Fact Sheet for Patients: BloggerCourse.comhttps://www.fda.gov/media/152166/download  Fact Sheet for Healthcare Providers: SeriousBroker.ithttps://www.fda.gov/media/152162/download  This test is not yet approved or cleared by the [place] FDA and has been authorized for detection and/or diagnosis of SARS-CoV-2 by FDA under an Emergency Use Authorization (EUA). This EUA will remain in effect (meaning this test can be used) for the duration of the COVID-19 declaration under Section 564(b)(1) of the Act, 21 U.S.C. section 360bbb-3(b)(1), unless the authorization is terminated or revoked.  Performed at [facility], [address] [place], [place] [address]          Radiology  Studies: No results found.      Scheduled Meds:  antiseptic oral rinse  15 mL Topical BID   bisacodyl  10 mg Rectal Daily   pantoprazole (PROTONIX) IV  40 mg Intravenous Q12H   Continuous Infusions:  levETIRAcetam       LOS: 4 days   Time spent: 15mins, case discussed with palliative care and hospice, daughter updated over the phone Greater than 50% of this time was spent in counseling, explanation of diagnosis, planning of further management,  and coordination of care.   Voice Recognition Reubin Milan dictation system was used to create this note, attempts have been made to correct errors. Please contact the author with questions and/or clarifications.   Albertine Grates, MD PhD FACP [facility]  Available via Epic secure chat 7am-7pm for nonurgent issues Please page for urgent issues To page the attending provider between 7A-7P or the covering provider during after hours 7P-7A, please log into the web site [email or web address] and access using universal [place] password for that web site. If you do not have the password, please call the hospital operator.    05/20/2021, 11:21 AM

## 2021-05-20 NOTE — Progress Notes (Signed)
Daily Progress Note   Patient Name: Kristi Webb       Date: 05/20/2021 DOB: 01/02/1932  Age: 87 y.o. MRN#: [id number] Attending Physician: Albertine Grates, MD Primary Care Physician: [name], NP Admit Date: 05/16/2021  Reason for Consultation/Follow-up: Disposition, Establishing goals of care, Non pain symptom management, Pain control, Psychosocial/spiritual support, and Terminal Care  Subjective: Chart review performed - noted night shift LPN note about family concern for NPO status and requesting second SLP follow up - regular diet had been ordered yesterday for EOL and SLP consult was ordered and completed yesterday afternoon, no need to reconsult. Received report from primary RN - no acute concerns.   Went to visit patient at bedside - no family/visitors present. Patient was lying in bed asleep - I did not attempt to wake her. No signs or non-verbal gestures of pain or discomfort noted. No respiratory distress, increased work of breathing, or secretions noted.   Called patient's daughter/Kristi Webb and granddaughter/Kristi Webb - therapeutic listening provided as they both expressed concern that LPN stated patient was NPO last night and told them they could not offer patient food/drink after she was accepting of an ice chip. Explained that regular diet was ordered as well as SLP eval yesterday after our phone conversation - reviewed SLP recommendations from yesterday. Sincerely apologized for miscommunication and validated that RN today is aware patient can eat/drink as desires for EOL. Careful hand feeding techniques reviewed with family -they expressed appreciation and understanding. Family stated that they are in the processes of rearranging/disassembling furniture in the home to make room for  hospital bed - request hospital bed be delivered Monday and are ok with patient discharged home after DME delivery. Explained that we do anticipate patient will continue to decline and patient's stability for transfer may change before Monday - they expressed understanding.  All questions and concerns addressed. Encouraged to call with questions and/or concerns. PMT number previously provided.   Length of Stay: 4  Current Medications: Scheduled Meds:   antiseptic oral rinse  15 mL Topical BID   bisacodyl  10 mg Rectal Daily   pantoprazole (PROTONIX) IV  40 mg Intravenous Q12H    Continuous Infusions:  levETIRAcetam      PRN Meds: acetaminophen **OR** acetaminophen, glycopyrrolate **OR** glycopyrrolate **OR** glycopyrrolate, haloperidol **OR** haloperidol **OR** haloperidol lactate,  LORazepam, midazolam, morphine injection, ondansetron **OR** ondansetron (ZOFRAN) IV, polyvinyl alcohol  Physical Exam Vitals and nursing note reviewed.  Constitutional:      General: She is not in acute distress.    Appearance: She is cachectic. She is ill-appearing.  Pulmonary:     Effort: No respiratory distress.  Skin:    General: Skin is cool and dry.  Neurological:     Mental Status: She is lethargic.     Motor: Weakness present.  Psychiatric:        Speech: She is noncommunicative.            Vital Signs: BP 120/74 (BP Location: Right Arm)   Pulse 63   Temp (!) 97.3 F (36.3 C) (Axillary)   Resp 18   Wt 41.7 kg   SpO2 100%   BMI 13.98 kg/m  SpO2: SpO2: 100 % O2 Device: O2 Device: Room Air O2 Flow Rate: O2 Flow Rate (L/min): 0 L/min  Intake/output summary: No intake or output data in the 24 hours ending 05/20/21 1108 LBM: Last BM Date: 05/20/21 Baseline Weight: Weight: 41.7 kg Most recent weight: Weight: 41.7 kg       Palliative Assessment/Data: PPS 10%    Flowsheet Rows    Flowsheet Row Most Recent Value  Intake Tab   Referral Department Hospitalist  Unit at Time of  Referral ER  Palliative Care Primary Diagnosis Neurology  Date Notified 05/16/21  Palliative Care Type New Palliative care  Reason for referral Clarify Goals of Care  Date of Admission 05/16/21  Date first seen by Palliative Care 05/18/21  # of days Palliative referral response time 2 Day(s)  # of days IP prior to Palliative referral 0  Clinical Assessment   Psychosocial & Spiritual Assessment   Palliative Care Outcomes   Patient/Family meeting held? Yes  Who was at the meeting? daughter, granddaughter  Palliative Care Outcomes Clarified goals of care, Counseled regarding hospice, Provided psychosocial or spiritual support  Patient/Family wishes: Interventions discontinued/not started  Tube feedings/TPN, PEG, Transfer out of ICU, Vasopressors, Hemodialysis       Patient Active Problem List   Diagnosis Date Noted   Palliative care by specialist    Goals of care, counseling/discussion    DNR (do not resuscitate)    DNI (do not intubate)    Advanced directives, counseling/discussion    Encounter for hospice care discussion    Concern about end of life    Hemorrhagic shock (HCC)    Shock circulatory (HCC) 05/16/2021   Lower GI bleed 05/16/2021   GIB (gastrointestinal bleeding) 05/16/2021   Allergic rhinitis 10/30/2019   Seizure disorder (HCC) 10/30/2019   Complex partial seizure with impairment of consciousness at onset [facility]) 04/17/2018   Hypokalemia 02/04/2014   Aspiration pneumonia (HCC) 12/24/2013   FUO (fever of unknown origin) 12/23/2013   Personal history of tobacco use, presenting hazards to health 04/29/2013   Shoulder joint pain 01/30/2013   Disorder of bone and cartilage 12/01/2012   Hx traumatic fracture 12/01/2012   Problems influencing health status 12/01/2012   Other specified diseases of blood and blood-forming organs 09/03/2012   Aspiration pneumonitis (HCC) 07/06/2012   Malnutrition (HCC) 05/29/2012   Acute hypernatremia 05/28/2012   Leukocytosis  05/28/2012   Sacral decubitus ulcer, stage II (HCC) 05/28/2012   Thyroid disorder 05/28/2012   Thrush 05/28/2012   FTT (failure to thrive) in adult 05/28/2012   Tachypnea 05/28/2012   Tachycardia 05/28/2012   Status post craniotomy 05/19/2012   Altered mental  status 12/27/2011   Dysphagia 12/27/2011   Pressure ulcer, stage 2 (HCC) 12/05/2011   Alzheimer's dementia without behavioral disturbance (HCC) 11/29/2011   Hypothyroidism 11/29/2011   Essential hypertension 11/29/2011   Subdural hematoma (HCC) 11/29/2011   Constipation 09/14/2011   Heartburn 07/06/2011    Palliative Care Assessment & Plan   Patient Profile: 87 y.o. female  with past medical history of advanced Alzheimer's dementia, hypertension, thyroid disease, seizures, prior subdural hematoma presented to the ED on 05/16/21 from home by family with concerns for rectal bleeding. On initial ED evaluation patient severely hypotensive with systolic 40s associated with lethargy.  Patient was found to have a large amount of bright rectal bleeding and had waxing and waning alertness.  Patient quickly was transfused with 2 units of emergency release blood and started on fluid boluses. After discussion with GI providers family decided continue IV fluids and nonaggressive modes of treatment as patient had shown  improvement. Patient was admitted on 05/16/2021 with hemorrhagic shock, lower GIB, and advanced dementia with metabolic encephalopathy.   Assessment: Hemmorrhagic shock Hypernatremia Hypokalemia QTC prolongation Seizure disorder with history of subdural hematoma Failure to thrive Terminal care  Recommendations/Plan: Continue full comfort measures Continue DNR/DNI as previously documented Plan for DME delivery Monday 8/22 - family ready for patient discharge home with hospice once hospital bed delivered Continue protonix until discharge Recommend continuing keppra for seizure prophylaxis as this is considered a comfort  measure Continue palliative wound care Nursing to provide frequent assessments and administer PRN medications as clinically necessary to ensure EOL comfort PMT will continue to follow holistically  Goals of Care and Additional Recommendations: Limitations on Scope of Treatment: Full Comfort Care  Code Status:    Code Status Orders  (From admission, onward)           Start     Ordered   05/19/21 1216  Do not attempt resuscitation (DNR)  Continuous       Question Answer Comment  In the event of cardiac or respiratory ARREST Do not call a "code blue"   In the event of cardiac or respiratory ARREST Do not perform Intubation, CPR, defibrillation or ACLS   In the event of cardiac or respiratory ARREST Use medication by any route, position, wound care, and other measures to relive pain and suffering. May use oxygen, suction and manual treatment of airway obstruction as needed for comfort.      05/19/21 1224           Code Status History     Date Active Date Inactive Code Status Order ID Comments User Context   05/16/2021 1952 05/19/2021 1224 DNR [id number]  Alessandra BevelsKamineni, Neelima, MD ED   05/16/2021 1703 05/16/2021 1952 DNR [id number]  Simonne MartinetBabcock, Peter E, NP ED   12/23/2013 1911 12/26/2013 2030 DNR [id number]  Rama, Maryruth Bunhristina P, MD Inpatient   07/06/2012 1622 07/07/2012 2021 DNR [id number]  Darletta MollSmith, Tim M, RN Inpatient   05/28/2012 1823 06/04/2012 2012 DNR [id number]  Annett GulaMcLean, Tracy N Inpatient   05/28/2012 1800 05/28/2012 1823 Full Code [id number]  Annett GulaMcLean, Tracy N Inpatient       Prognosis:  < 2 weeks  Discharge Planning: Home with Hospice  Care plan was discussed with primary RN, TOC, [facility] liaison, patient's family, Dr. Roda ShuttersXu  Thank you for allowing the Palliative Medicine Team to assist in the care of this patient.   Total Time 40 minutes Prolonged Time Billed  no       Greater than 50%  of this  time was spent counseling and coordinating care related to the above assessment and plan.  Haskel Khan, NP  Please contact Palliative Medicine Team phone at [phone] for questions and concerns.

## 2021-05-20 NOTE — Progress Notes (Signed)
[facility] 1[facility]15 [facility]) Hospital Liaison RN note  Received request from [facility] for hospice services at home after discharge. Chart and patient information under review by Hospice physician. Hospice eligibility pending at this time. Spoke with patient's daughter Lessie Dings and a Nurse, learning disability to initiate education related to hospice philosophy, services, and team approach to care. Patient/family verbalized understanding of information given. Per discussion, the plan is for discharge home by [facility] on Monday 8.22.2022 DME needs discussed. Patient has the following equipment in the home. Overbed table. Patient/family requests the following equipment for deliver: Hospital bed and wheelchair. Address has been verified and is correct in the chart. Vickye at [phone] is the family contact to arrange time of equipment delivery.   Please send signed and completed DNR with patient/family. Please provide prescriptions at discharge for ongoing symptom management. [facility] information and contact numbers given to [facility] and left in room. Above information shared with Earle Gell.  Please call with any hospice related questions or concerns.  Thank you for the opportunity to participate in this patient's care.  Thea Gist, Charity fundraiser, BSN [employer] [phone]

## 2021-05-20 NOTE — Progress Notes (Signed)
Was called to room and Patient 's daughter had given patient some ice chips . Daughter felt like patient could take ice chips now and poss. Drink some water or try some apple sauce. Due to patient was opening mouth and chewing on ice. Explain patient was NPO and I would call Doctor on call and ask him about this. Dr. Robb Matar on call and was paged and return call. M.D. did not want patient to be given anything tonight and to have M.D. in A.M. review patient progress concern for Asp. Pnu. Patient daughter would like a repeat swallow test done Saturday and would like to speak with M.D. Saturday A.M. Daughter Larene Beach [phone]. Granddaughter  Doreene Eland [phone]

## 2021-05-21 NOTE — Progress Notes (Signed)
[facility] 4E22 [facility]) Hospital Liaison RN Note  Visited patient at bedside. Patient resting in bed with no noted distress.   Spoke with care team at hospital. Plan remains for patient to discharge home with hospice services on Monday once caregivers are in place.   [facility] hospital liaison will continue to follow through disposition.   Please do not hesitate to call with any hospice related questions.   Thank you for the opportunity to participate in this patient's care.  Bobbie "Einar Gip, RN, BSN [facility] Liaison [phone]

## 2021-05-21 NOTE — Progress Notes (Addendum)
Daily Progress Note   Patient Name: Kristi Webb       Date: 05/21/2021 DOB: 12-Jan-1932  Age: 87 y.o. MRN#: [id number] Attending Physician: Albertine Grates, MD Primary Care Physician: [name], NP Admit Date: 05/16/2021  Reason for Consultation/Follow-up: Disposition, Establishing goals of care, Non pain symptom management, Pain control, Psychosocial/spiritual support, and Terminal Care  Subjective: Chart review performed. Received report from primary RN - no acute concerns. RN reports patient has no PO intake. Discussed plan of care with RN and answered EOL questions.  Went to visit patient at bedside - no family/visitors present. Patient was lying in bed asleep - she does briefly wake to voice/gentle touch but promptly falls back asleep. She does not respond to questions. No signs or non-verbal gestures of pain or discomfort noted. No respiratory distress, increased work of breathing, or secretions noted.   Called daughter/Kristi Webb to provide updates and support. Reviewed patient's current status today. Provided emotional support and therapeutic listening. Reviewed communication process for tomorrow - requested after DME delivered to the home, she call TOC and notify them so we can start discharge paperwork and set up non-emergent transport - she expressed understanding. Lessie Dings confirms they will be ready for DME tomorrow and patient discharge home.  Kristi Webb expressed appreciation for call today.  All questions and concerns addressed. Encouraged to call with questions and/or concerns. PMT number provided.  Length of Stay: 5  Current Medications: Scheduled Meds:   antiseptic oral rinse  15 mL Topical BID   pantoprazole (PROTONIX) IV  40 mg Intravenous Q12H    Continuous Infusions:   levETIRAcetam 250 mg (05/21/21 1039)    PRN Meds: acetaminophen **OR** acetaminophen, glycopyrrolate **OR** glycopyrrolate **OR** glycopyrrolate, haloperidol **OR** haloperidol **OR** haloperidol lactate, LORazepam, midazolam, morphine injection, ondansetron **OR** ondansetron (ZOFRAN) IV, polyvinyl alcohol  Physical Exam Vitals and nursing note reviewed.  Constitutional:      General: She is not in acute distress.    Appearance: She is cachectic. She is ill-appearing.  Pulmonary:     Effort: No respiratory distress.  Skin:    General: Skin is cool and dry.  Neurological:     Mental Status: She is lethargic.     Motor: Weakness present.  Psychiatric:        Speech: She is noncommunicative.  Vital Signs: BP 120/74 (BP Location: Right Arm)   Pulse 72   Temp (!) 97.5 F (36.4 C) (Axillary)   Resp 16   Wt 41.7 kg   SpO2 100%   BMI 13.98 kg/m  SpO2: SpO2: 100 % O2 Device: O2 Device: Room Air O2 Flow Rate: O2 Flow Rate (L/min): 0 L/min  Intake/output summary: No intake or output data in the 24 hours ending 05/21/21 1253 LBM: Last BM Date: 05/20/21 Baseline Weight: Weight: 41.7 kg Most recent weight: Weight: 41.7 kg       Palliative Assessment/Data: PPS 10%    Flowsheet Rows    Flowsheet Row Most Recent Value  Intake Tab   Referral Department Hospitalist  Unit at Time of Referral ER  Palliative Care Primary Diagnosis Neurology  Date Notified 05/16/21  Palliative Care Type New Palliative care  Reason for referral Clarify Goals of Care  Date of Admission 05/16/21  Date first seen by Palliative Care 05/18/21  # of days Palliative referral response time 2 Day(s)  # of days IP prior to Palliative referral 0  Clinical Assessment   Psychosocial & Spiritual Assessment   Palliative Care Outcomes   Patient/Family meeting held? Yes  Who was at the meeting? daughter, granddaughter  Palliative Care Outcomes Clarified goals of care, Counseled regarding hospice,  Provided psychosocial or spiritual support  Patient/Family wishes: Interventions discontinued/not started  Tube feedings/TPN, PEG, Transfer out of ICU, Vasopressors, Hemodialysis       Patient Active Problem List   Diagnosis Date Noted   Terminal care    Palliative care by specialist    Goals of care, counseling/discussion    DNR (do not resuscitate)    DNI (do not intubate)    Advanced directives, counseling/discussion    Encounter for hospice care discussion    Concern about end of life    Hemorrhagic shock (HCC)    Shock circulatory (HCC) 05/16/2021   Lower GI bleed 05/16/2021   GIB (gastrointestinal bleeding) 05/16/2021   Allergic rhinitis 10/30/2019   Seizure disorder (HCC) 10/30/2019   Complex partial seizure with impairment of consciousness at onset [facility]) 04/17/2018   Hypokalemia 02/04/2014   Aspiration pneumonia (HCC) 12/24/2013   FUO (fever of unknown origin) 12/23/2013   Personal history of tobacco use, presenting hazards to health 04/29/2013   Shoulder joint pain 01/30/2013   Disorder of bone and cartilage 12/01/2012   Hx traumatic fracture 12/01/2012   Problems influencing health status 12/01/2012   Other specified diseases of blood and blood-forming organs 09/03/2012   Aspiration pneumonitis (HCC) 07/06/2012   Malnutrition (HCC) 05/29/2012   Acute hypernatremia 05/28/2012   Leukocytosis 05/28/2012   Sacral decubitus ulcer, stage II (HCC) 05/28/2012   Thyroid disorder 05/28/2012   Thrush 05/28/2012   FTT (failure to thrive) in adult 05/28/2012   Tachypnea 05/28/2012   Tachycardia 05/28/2012   Status post craniotomy 05/19/2012   Altered mental status 12/27/2011   Dysphagia 12/27/2011   Pressure ulcer, stage 2 (HCC) 12/05/2011   Alzheimer's dementia without behavioral disturbance (HCC) 11/29/2011   Hypothyroidism 11/29/2011   Essential hypertension 11/29/2011   Subdural hematoma (HCC) 11/29/2011   Constipation 09/14/2011   Heartburn 07/06/2011     Palliative Care Assessment & Plan   Patient Profile: 87 y.o. female  with past medical history of advanced Alzheimer's dementia, hypertension, thyroid disease, seizures, prior subdural hematoma presented to the ED on 05/16/21 from home by family with concerns for rectal bleeding. On initial ED evaluation patient severely hypotensive with systolic  40s associated with lethargy.  Patient was found to have a large amount of bright rectal bleeding and had waxing and waning alertness.  Patient quickly was transfused with 2 units of emergency release blood and started on fluid boluses. After discussion with GI providers family decided continue IV fluids and nonaggressive modes of treatment as patient had shown  improvement. Patient was admitted on 05/16/2021 with hemorrhagic shock, lower GIB, and advanced dementia with metabolic encephalopathy.  Assessment: Hemmorrhagic shock Hypernatremia Hypokalemia QTC prolongation Seizure disorder with history of subdural hematoma Failure to thrive Terminal care  Recommendations/Plan: Continue full comfort measures Continue DNR/DNI as previously documented Plan for DME delivery Monday 8/22 - family ready for patient discharge home with hospice once hospital bed delivered Continue protonix until discharge Recommend continuing keppra for seizure prophylaxis as this is considered a comfort measure Continue palliative wound care Continue current EOL comfort focused medication regimen as patient appears comfortable Nursing to provide frequent assessments and administer PRN medications as clinically necessary to ensure EOL comfort PMT will continue to follow holistically  Goals of Care and Additional Recommendations: Limitations on Scope of Treatment: Full Comfort Care  Code Status:    Code Status Orders  (From admission, onward)           Start     Ordered   05/19/21 1216  Do not attempt resuscitation (DNR)  Continuous       Question Answer  Comment  In the event of cardiac or respiratory ARREST Do not call a "code blue"   In the event of cardiac or respiratory ARREST Do not perform Intubation, CPR, defibrillation or ACLS   In the event of cardiac or respiratory ARREST Use medication by any route, position, wound care, and other measures to relive pain and suffering. May use oxygen, suction and manual treatment of airway obstruction as needed for comfort.      05/19/21 1224           Code Status History     Date Active Date Inactive Code Status Order ID Comments User Context   05/16/2021 1952 05/19/2021 1224 DNR [id number]  Alessandra Bevels, MD ED   05/16/2021 1703 05/16/2021 1952 DNR [id number]  Simonne Martinet, NP ED   12/23/2013 1911 12/26/2013 2030 DNR [id number]  Rama, Maryruth Bun, MD Inpatient   07/06/2012 1622 07/07/2012 2021 DNR [id number]  Darletta Moll, RN Inpatient   05/28/2012 1823 06/04/2012 2012 DNR [id number]  Annett Gula Inpatient   05/28/2012 1800 05/28/2012 1823 Full Code [id number]  Annett Gula Inpatient       Prognosis:  < 2 weeks  Discharge Planning: Home with Hospice  Care plan was discussed with primary RN, patient's daughter  Thank you for allowing the Palliative Medicine Team to assist in the care of this patient.   Total Time 30 minutes Prolonged Time Billed  no       Greater than 50%  of this time was spent counseling and coordinating care related to the above assessment and plan.  Haskel Khan, NP  Please contact Palliative Medicine Team phone at [phone] for questions and concerns.

## 2021-05-21 NOTE — Progress Notes (Signed)
PROGRESS NOTE    Kristi Webb  EHU:[id number] DOB: 13-Jun-1932 DOA: 05/16/2021 PCP: [name], NP    No chief complaint on file.   Brief Narrative:  Kristi Webb is a 87 y.o. female with advanced Alzheimer's dementia, hypertension, thyroid disease, seizures, prior subdural hematoma brought into the ED by family and concern for rectal hemorrhage since 1 PM today.Per ED physician family brought in a "bucket full of blood".  On initial ED evaluation patient severely hypotensive with systolic 40s associated with lethargy.  Patient was found to have a large amount of bright rectal bleeding and had waxing and waning alertness.  Patient quickly was transfused with 2 units of emergency release blood and started on fluid boluses. GI and critical care were consulted.  ED physician, PCCM and GI had discussions with family regarding critical condition, risks of anesthesia, prognosis and CODE STATUS.  Patient was DNR/DNI prior to presentation and family chose to continue the same.  Fortunately her blood pressure did improve with blood and fluid resuscitation.  Family would like to continue IV fluids and nonaggressive modes of treatment as patient has shown improvement at this time understanding risk of deterioration during the hospital stay.  Subjective:  She is on full comfort measures  Open eyes to voice briefly  She is nonverbal, not following commands, no agitation    Assessment & Plan:   Principal Problem:   Shock circulatory (HCC) Active Problems:   Acute hypernatremia   Alzheimer's dementia without behavioral disturbance (HCC)   Hypothyroidism   Essential hypertension   Seizure disorder (HCC)   Lower GI bleed   GIB (gastrointestinal bleeding)   Palliative care by specialist   Goals of care, counseling/discussion   DNR (do not resuscitate)   DNI (do not intubate)   Advanced directives, counseling/discussion   Encounter for hospice care discussion   Concern about end of life    Terminal care   Hemorrhagic shock/rectal bleeding -Seen by GI and critical care, conservative management -Blood pressure improved after PRBC x2 units and fluids bolus, hemoglobin 14.9 this morning -no active bleed since admission, stool is brown -Now on comfort measures   Hypernatremia Likely from volume depletion, sodium 160 on presentation -sodium improved after hydration  -Started on comfort measures   Hypokalemia replaced by iv, improved Now on comfort measures  Advanced dementia presented with acute metabolic encephalopathy Due to hemorrhagic shock and hyponatremia Hold Aricept and Namenda Now on comfort measures   QTC prolongation Keep K above 4, mag above 2 QTC 505 on presentation, improved on repeat Now on comfort measures  Seizure disorder with history of subdural hematoma Home medication Depakote changed to IV due to unable to tolerate p.o. from altered mental status Continue antiseizure medication while on comfort measures  Hypothyroidism Currently on IV Synthroid, transition to p.o. when mental status improves Now on comfort measures  Failure to thrive, total care at baseline, 50lbs weight loss in the last year Palliative care input appreciated, patient is on comfort measures, plan to go home with home hospice once  home equipment delivered and help at home secured by daughter     Skin Assessment:  I have examined the patient's skin and I agree with the wound assessment as performed by the wound care RN as outlined below:  Pressure Ulcer 05/28/12 Stage I -  Intact skin with non-blanchable redness of a localized area usually over a bony prominence. bruised looking area over left outer ankle (Active)  05/28/12 1600  Location:  Ankle  Location Orientation: Left;Lateral  Staging: Stage I -  Intact skin with non-blanchable redness of a localized area usually over a bony prominence.  Wound Description (Comments): bruised looking area over left outer ankle   Present on Admission:     Unresulted Labs (From admission, onward)    None         DVT prophylaxis:   On full comfort measures   Code Status:DNR Family Communication: daughter at bedside on 8/17, on 8/20 vickye would like to be contacted day or night if needed   Disposition:   Status is: Inpatient  Dispo: The patient is from: home              Anticipated d/c is to: monday              Anticipated d/c date is: Home with home hospice on monday, after home equipment delivered, help at home and secured by daughter                Consultants:  GI Critical care Palliative care  Procedures:  none  Antimicrobials:    Anti-infectives (From admission, onward)    None          Objective: Vitals:   05/20/21 1358 05/20/21 1732 05/20/21 1940 05/21/21 0445  BP:  139/80 102/78 120/74  Pulse:  68 66 72  Resp: 15  18 16   Temp: 97.7 F (36.5 C)  97.9 F (36.6 C) (!) 97.5 F (36.4 C)  TempSrc: Axillary  Axillary Axillary  SpO2:  100% 100% 100%  Weight:       No intake or output data in the 24 hours ending 05/21/21 1225  Filed Weights   05/17/21 1800  Weight: 41.7 kg    Examination:  General exam: advance dementia, nonverbal at baseline, open eyes briefly to voice, no sign of distress  Respiratory system:  Respiratory effort normal. Psychiatry: advanced dementia, nonverbal, does not follow commands, no agitation currently    Data Reviewed: I have personally reviewed following labs and imaging studies  CBC: Recent Labs  Lab 05/16/21 1513 05/16/21 1556 05/16/21 2002 05/17/21 0300 05/18/21 0211  WBC 6.5  --   --  6.5 5.2  NEUTROABS 2.9  --   --   --   --   HGB 12.3 13.6 14.8 14.9 14.0  HCT 41.6 40.0 46.9* 45.9 41.6  MCV 93.9  --   --  87.8 83.4  PLT 161  --   --  95* 114*    Basic Metabolic Panel: Recent Labs  Lab 05/16/21 1513 05/16/21 1556 05/16/21 2002 05/17/21 0300 05/18/21 0211 05/19/21 0236  NA 157* 160* 154* 151* 147* 146*  K  3.2* 3.3* 3.8 3.8 3.4* 3.5  CL 121* 122* 122* 121* 112* 114*  CO2 27  --  26 24 25 27   GLUCOSE 121* 130* 87 87 111* 123*  BUN 20 20 19 17 10  5*  CREATININE 0.60 0.40* 0.48 0.37* 0.48 0.48  CALCIUM 8.8*  --  8.2* 8.1* 8.7* 8.4*  MG  --   --   --  2.1  --   --   PHOS  --   --   --  2.6  --   --     GFR: Estimated Creatinine Clearance: 32.6 mL/min (by C-G formula based on SCr of 0.48 mg/dL).  Liver Function Tests: Recent Labs  Lab 05/16/21 1513  AST 24  ALT 18  ALKPHOS 69  BILITOT 0.8  PROT 6.4*  ALBUMIN 2.4*    CBG: Recent Labs  Lab 05/18/21 1220  GLUCAP 111*     Recent Results (from the past 240 hour(s))  Resp Panel by RT-PCR (Flu A&B, Covid) Nasopharyngeal Swab     Status: None   Collection Time: 05/16/21  3:37 PM   Specimen: Nasopharyngeal Swab; Nasopharyngeal(NP) swabs in vial transport medium  Result Value Ref Range Status   SARS Coronavirus 2 by RT PCR NEGATIVE NEGATIVE Final    Comment: (NOTE) SARS-CoV-2 target nucleic acids are NOT DETECTED.  The SARS-CoV-2 RNA is generally detectable in upper respiratory specimens during the acute phase of infection. The lowest concentration of SARS-CoV-2 viral copies this assay can detect is 138 copies/mL. A negative result does not preclude SARS-Cov-2 infection and should not be used as the sole basis for treatment or other patient management decisions. A negative result may occur with  improper specimen collection/handling, submission of specimen other than nasopharyngeal swab, presence of viral mutation(s) within the areas targeted by this assay, and inadequate number of viral copies(<138 copies/mL). A negative result must be combined with clinical observations, patient history, and epidemiological information. The expected result is Negative.  Fact Sheet for Patients:  BloggerCourse.com  Fact Sheet for Healthcare Providers:  SeriousBroker.it  This test is no t  yet approved or cleared by the [place] FDA and  has been authorized for detection and/or diagnosis of SARS-CoV-2 by FDA under an Emergency Use Authorization (EUA). This EUA will remain  in effect (meaning this test can be used) for the duration of the COVID-19 declaration under Section 564(b)(1) of the Act, 21 U.S.C.section 360bbb-3(b)(1), unless the authorization is terminated  or revoked sooner.       Influenza A by PCR NEGATIVE NEGATIVE Final   Influenza B by PCR NEGATIVE NEGATIVE Final    Comment: (NOTE) The Xpert Xpress SARS-CoV-2/FLU/RSV plus assay is intended as an aid in the diagnosis of influenza from Nasopharyngeal swab specimens and should not be used as a sole basis for treatment. Nasal washings and aspirates are unacceptable for Xpert Xpress SARS-CoV-2/FLU/RSV testing.  Fact Sheet for Patients: BloggerCourse.com  Fact Sheet for Healthcare Providers: SeriousBroker.it  This test is not yet approved or cleared by the [place] FDA and has been authorized for detection and/or diagnosis of SARS-CoV-2 by FDA under an Emergency Use Authorization (EUA). This EUA will remain in effect (meaning this test can be used) for the duration of the COVID-19 declaration under Section 564(b)(1) of the Act, 21 U.S.C. section 360bbb-3(b)(1), unless the authorization is terminated or revoked.  Performed at [facility], [address], [place], [place] [address]          Radiology Studies: No results found.      Scheduled Meds:  antiseptic oral rinse  15 mL Topical BID   pantoprazole (PROTONIX) IV  40 mg Intravenous Q12H   Continuous Infusions:  levETIRAcetam 250 mg (05/21/21 1039)     LOS: 5 days   Time spent: , Greater than 50% of this time was spent in counseling, explanation of diagnosis, planning of further management, and coordination of care.   Voice Recognition Reubin Milan dictation system  was used to create this note, attempts have been made to correct errors. Please contact the author with questions and/or clarifications.   Albertine Grates, MD PhD FACP [facility]  Available via Epic secure chat 7am-7pm for nonurgent issues Please page for urgent issues To page the attending provider between 7A-7P or the covering provider during after  hours 7P-7A, please log into the web site [email or web address] and access using universal Oasis password for that web site. If you do not have the password, please call the hospital operator.    05/21/2021, 12:25 PM

## 2021-05-22 MED ORDER — PANTOPRAZOLE SODIUM 40 MG PO PACK: 40.0000 mg | PACK | Freq: Two times a day (BID) | ORAL | 1 refills | Status: AC

## 2021-05-22 MED ORDER — LORAZEPAM 0.5 MG PO TABS: 0.5000 mg | ORAL_TABLET | Freq: Four times a day (QID) | ORAL | 0 refills | Status: AC | PRN

## 2021-05-22 MED ORDER — GLYCOPYRROLATE 1 MG PO TABS: 1.0000 mg | ORAL_TABLET | ORAL | 0 refills | Status: AC | PRN

## 2021-05-22 MED ORDER — ONDANSETRON 4 MG PO TBDP: 4.0000 mg | ORAL_TABLET | Freq: Four times a day (QID) | ORAL | 0 refills | Status: AC | PRN

## 2021-05-22 MED ORDER — LEVETIRACETAM 250 MG PO TABS: 250.0000 mg | ORAL_TABLET | Freq: Two times a day (BID) | ORAL | 0 refills | Status: DC

## 2021-05-22 MED ORDER — LEVETIRACETAM 100 MG/ML PO SOLN: 250.0000 mg | Freq: Two times a day (BID) | ORAL | 0 refills | Status: AC

## 2021-05-22 MED ORDER — HALOPERIDOL LACTATE 2 MG/ML PO CONC: 2.0000 mg | Freq: Four times a day (QID) | ORAL | 0 refills | Status: AC | PRN

## 2021-05-22 MED ORDER — PANTOPRAZOLE SODIUM 40 MG PO TBEC: 40.0000 mg | DELAYED_RELEASE_TABLET | Freq: Two times a day (BID) | ORAL | 0 refills | Status: DC

## 2021-05-22 NOTE — Progress Notes (Signed)
Discharge instructions provided to [facility] for transport. IV removed and foley left in place. Patient's daugher, Lessie Dings, called and updated.

## 2021-05-22 NOTE — TOC Progression Note (Signed)
Transition of Care [facility]) - Progression Note    Patient Details  Name: Kristi Webb MRN: [id number] Date of Birth: [birth year]  Transition of [facility]) CM/SW Contact  [name], RN Phone Number: 05/22/2021, 12:35 PM  Clinical Narrative:    NCM notified patient for dc home with Hospice, [facility] when DME (hospital bed) has been delivered to the home.  Will need ptar transport.    Expected Discharge Plan: Home w Hospice Care Barriers to Discharge: Other (must enter comment) (Hospice referral and DME delivery)  Expected Discharge Plan and Services Expected Discharge Plan: Home w Hospice Care   Discharge Planning Services: CM Consult Post Acute Care Choice: Hospice Living arrangements for the past 2 months: Single Family Home Expected Discharge Date: 05/22/21               DME Arranged: Hospice Equipment Package Others DME Agency: [facility] Date DME Agency Contacted: 05/19/21   Representative spoke with at DME Agency: Per [facility] Agency: [facility] Date [facility] Agency Contacted: 05/19/21 Time HH Agency Contacted: 1215 Representative spoke with at [facility] Agency: [name]   Social Determinants of Health (SDOH) Interventions    Readmission Risk Interventions No flowsheet data found.

## 2021-05-22 NOTE — Progress Notes (Signed)
Daily Progress Note   Patient Name: Kristi Webb       Date: 05/22/2021 DOB: 08-26-1932  Age: 87 y.o. MRN#: [id number] Attending Physician: Shelly Coss, MD Primary Care Physician: Vevelyn Francois, NP Admit Date: 05/16/2021  Reason for Consultation/Follow-up: end of life care, symptom management  Subjective: Patient appears comfortable. Unresponsive to voice and light touch. No non-verbal signs of pain or discomfort noted. Respirations are shallow, even, and unlabored. Her mouth is fixed in an open position. No excessive respiratory secretions noted.   11:45 - Spoke with [place] regarding patient stability to transport home. She met me at bedside to assess. On return to room, patient opens her eyes but otherwise is minimally responsive.   12:05 - [name] has spoken with daughter to make sure she is aware prognosis is likely days at this point and offered alternate option for [facility]. Daughter has stated that she is aware of prognosis and wishes to proceed with transfer home.      Length of Stay: 6  Current Medications: Scheduled Meds:   antiseptic oral rinse  15 mL Topical BID   pantoprazole (PROTONIX) IV  40 mg Intravenous Q12H    Continuous Infusions:  levETIRAcetam 250 mg (05/22/21 1105)    PRN Meds: acetaminophen **OR** acetaminophen, glycopyrrolate **OR** glycopyrrolate **OR** glycopyrrolate, haloperidol **OR** haloperidol **OR** haloperidol lactate, LORazepam, midazolam, morphine injection, ondansetron **OR** ondansetron (ZOFRAN) IV, polyvinyl alcohol     Vital Signs: BP (!) 105/58 (BP Location: Right Arm)   Pulse 67   Temp (!) 96.6 F (35.9 C) (Axillary)   Resp 16   Wt 41.7 kg   SpO2 100%   BMI 13.98 kg/m  SpO2: SpO2: 100 % O2 Device: O2  Device: Room Air O2 Flow Rate: O2 Flow Rate (L/min): 0 L/min  Intake/output summary:  Intake/Output Summary (Last 24 hours) at 05/22/2021 1138 Last data filed at 05/22/2021 0200 Gross per 24 hour  Intake 306 ml  Output --  Net 306 ml   LBM: Last BM Date: 05/20/21 Baseline Weight: Weight: 41.7 kg Most recent weight: Weight: 41.7 kg       Palliative Assessment/Data: PPS 10%      Palliative Care Assessment & Plan   Patient Profile: 87 y.o. female  with past medical history of advanced Alzheimer's dementia, hypertension, thyroid disease,  seizures, prior subdural hematoma presented to the ED on 05/16/21 from home by family with concerns for rectal bleeding. On initial ED evaluation patient severely hypotensive with systolic 10Y associated with lethargy.  Patient was found to have a large amount of bright rectal bleeding and had waxing and waning alertness.  Patient quickly was transfused with 2 units of emergency release blood and started on fluid boluses. After discussion with GI providers family decided continue IV fluids and nonaggressive modes of treatment as patient had shown  improvement. Patient was admitted on 05/16/2021 with hemorrhagic shock, lower GIB, and advanced dementia with metabolic encephalopathy.   Assessment: Hemmorrhagic shock Hypernatremia Hypokalemia QTC prolongation Seizure disorder with history of subdural hematoma Failure to thrive Terminal care  Recommendations/Plan: Continue full comfort measures Pending discharge home with hospice - patient appears stable for transport PRN medications are available for symptom management at EOL  Goals of Care and Additional Recommendations: Limitations on Scope of Treatment: Full Comfort Care  Code Status: DNR/DNI   Prognosis:  Days  Discharge Planning: Home with Hospice    Thank you for allowing the Palliative Medicine Team to assist in the care of this patient.   Total Time 25 minutes Prolonged Time  Billed  no       Greater than 50%  of this time was spent counseling and coordinating care related to the above assessment and plan.  Lavena Bullion, NP  Please contact Palliative Medicine Team phone at [phone] for questions and concerns.

## 2021-05-22 NOTE — Progress Notes (Signed)
Patient bladder scanned at >364 mL. Per order, foley catheter inserted following protocol.  375 mL of clear, yellow urine returned.

## 2021-05-22 NOTE — Care Management Important Message (Signed)
Important Message  Patient Details  Name: Kristi Webb MRN: [id number] Date of Birth: Sep 23, 1932   Medicare Important Message Given:  Yes     Renie Ora 05/22/2021, 1:04 PM

## 2021-05-22 NOTE — Discharge Summary (Addendum)
Physician Discharge Summary  Kristi Webb JQG:[id number] DOB: 1932-09-15 DOA: 05/16/2021  PCP: [name], NP  Admit date: 05/16/2021 Discharge date: 05/22/2021  Admitted From: Home Disposition:  Home  Discharge Condition:Stable CODE STATUS: Comfort Care Diet recommendation: Regular   Brief/Interim Summary: Kristi Webb is a 87 y.o. female with advanced Alzheimer's dementia, hypertension, thyroid disease, seizures, prior subdural hematoma brought into the ED by family and concern for rectal hemorrhage since 1 PM today.Per ED physician family brought in a "bucket full of blood".  On initial ED evaluation patient severely hypotensive with systolic 40s associated with lethargy.  Patient was found to have a large amount of bright rectal bleeding and had waxing and waning alertness.  Patient quickly was transfused with 2 units of emergency release blood and started on fluid boluses. GI and critical care were consulted.  ED physician, PCCM and GI had discussions with family regarding critical condition, risks of anesthesia, prognosis and CODE STATUS.  Patient was DNR/DNI prior to presentation and family chose to continue the same.  Fortunately her blood pressure did improve with blood and fluid resuscitation.  After extensive discussion with family, decision was made to transition her care to comfort.  Plan is to discharge her home today with hospice.  Following problems were addressed during her hospitalization:   Hemorrhagic shock/rectal bleeding -Seen by GI and critical care, plan is for conservative management -Blood pressure improved after PRBC x2 units and fluids bolus, -no active bleed since admission, stool is brown -Now on comfort measures     Hypernatremia Likely from volume depletion, sodium 160 on presentation -sodium improved after hydration  -Started on comfort measures    Hypokalemia replaced by iv, improved Now on comfort measures   Advanced dementia presented with  acute metabolic encephalopathy Due to hemorrhagic shock and hyponatremia Hold Aricept and Namenda Now on comfort measures   QTC prolongation QTC 505 on presentation, improved on repeat Now on comfort measures   Seizure disorder with history of subdural hematoma On keppra Continue antiseizure medication while on comfort measures   Hypothyroidism Now on comfort measures   Failure to thrive, total care at baseline, 50lbs weight loss in the last year Palliative care input appreciated, patient is on comfort measures, plan to go home with home hospice once  home equipment delivered and help at home secured by daughter        Discharge Diagnoses:  Principal Problem:   Shock circulatory (HCC) Active Problems:   Acute hypernatremia   Alzheimer's dementia without behavioral disturbance (HCC)   Hypothyroidism   Essential hypertension   Seizure disorder (HCC)   Lower GI bleed   GIB (gastrointestinal bleeding)   Palliative care by specialist   Goals of care, counseling/discussion   DNR (do not resuscitate)   DNI (do not intubate)   Advanced directives, counseling/discussion   Encounter for hospice care discussion   Concern about end of life   Terminal care    Discharge Instructions  Discharge Instructions     Diet general   Complete by: As directed    Discharge instructions   Complete by: As directed    Please follow up with home hospice   Discharge wound care:   Complete by: As directed    As per hospice agency   Increase activity slowly   Complete by: As directed       Allergies as of 05/22/2021   No Known Allergies      Medication List  STOP taking these medications    acetaminophen 500 MG tablet Commonly known as: TYLENOL   aspirin 81 MG tablet   CALCIUM PO   cetirizine 10 MG tablet Commonly known as: ZYRTEC   divalproex 125 MG capsule Commonly known as: Depakote Sprinkles   donepezil 10 MG tablet Commonly known as: ARICEPT    levothyroxine 75 MCG tablet Commonly known as: SYNTHROID   memantine 5 MG tablet Commonly known as: NAMENDA   MULTIVITAMIN PO   vitamin C 1000 MG tablet   Vitamin D3 10 MCG (400 UNIT) Caps       TAKE these medications    glycopyrrolate 1 MG tablet Commonly known as: ROBINUL Take 1 tablet (1 mg total) by mouth every 4 (four) hours as needed (excessive secretions).   haloperidol 2 MG/ML solution Commonly known as: HALDOL Place 1 mL (2 mg total) under the tongue every 6 (six) hours as needed for agitation (or delirium).   levETIRAcetam 100 MG/ML solution Commonly known as: Keppra Take 2.5 mLs (250 mg total) by mouth 2 (two) times daily.   LORazepam 0.5 MG tablet Commonly known as: ATIVAN Place 1 tablet (0.5 mg total) under the tongue every 6 (six) hours as needed for seizure.   ondansetron 4 MG disintegrating tablet Commonly known as: ZOFRAN-ODT Take 1 tablet (4 mg total) by mouth every 6 (six) hours as needed for nausea.   pantoprazole sodium 40 mg Pack Commonly known as: PROTONIX Take 20 mLs (40 mg total) by mouth 2 (two) times daily.               Discharge Care Instructions  (From admission, onward)           Start     Ordered   05/22/21 0000  Discharge wound care:       Comments: As per hospice agency   05/22/21 0950            Follow-up Information     [facility] Follow up.   Specialty: Hospice and Palliative Medicine Why: Home Hospice referral Contact information: [address] [place] [address] [phone]               No Known Allergies  Consultations: Palliative care,gI,PCCM   Procedures/Studies: No results found.    Subjective: Patient seen and examined at the bedside this morning.  She was on full comfort care.  Did not respond that much .  Appears comfortable  Discharge Exam: Vitals:   05/21/21 1654 05/22/21 0720  BP: (!) 97/55 (!) 105/58  Pulse: 68 67  Resp: 16   Temp:  (!)  96.6 F (35.9 C)  SpO2: 100% 100%   Vitals:   05/20/21 1940 05/21/21 0445 05/21/21 1654 05/22/21 0720  BP: 102/78 120/74 (!) 97/55 (!) 105/58  Pulse: 66 72 68 67  Resp: Temp: 97.9 F (36.6 C) (!) 97.5 F (36.4 C)  (!) 96.6 F (35.9 C)  TempSrc: Axillary Axillary  Axillary  SpO2: 100% 100% 100% 100%  Weight:        General: Pt is not alert or  awake, on full comfort care Cardiovascular: RRR, S1/S2 +, no rubs, no gallops Respiratory: CTA bilaterally, no wheezing, no rhonchi Abdominal: Soft, NT, ND Extremities: no edema, no cyanosis    The results of significant diagnostics from this hospitalization (including imaging, microbiology, ancillary and laboratory) are listed below for reference.     Microbiology: Recent Results (from the past 240 hour(s))  Resp  Panel by RT-PCR (Flu A&B, Covid) Nasopharyngeal Swab     Status: None   Collection Time: 05/16/21  3:37 PM   Specimen: Nasopharyngeal Swab; Nasopharyngeal(NP) swabs in vial transport medium  Result Value Ref Range Status   SARS Coronavirus 2 by RT PCR NEGATIVE NEGATIVE Final    Comment: (NOTE) SARS-CoV-2 target nucleic acids are NOT DETECTED.  The SARS-CoV-2 RNA is generally detectable in upper respiratory specimens during the acute phase of infection. The lowest concentration of SARS-CoV-2 viral copies this assay can detect is 138 copies/mL. A negative result does not preclude SARS-Cov-2 infection and should not be used as the sole basis for treatment or other patient management decisions. A negative result may occur with  improper specimen collection/handling, submission of specimen other than nasopharyngeal swab, presence of viral mutation(s) within the areas targeted by this assay, and inadequate number of viral copies(<138 copies/mL). A negative result must be combined with clinical observations, patient history, and epidemiological information. The expected result is Negative.  Fact Sheet for  Patients:  BloggerCourse.com  Fact Sheet for Healthcare Providers:  SeriousBroker.it  This test is no t yet approved or cleared by the [place] FDA and  has been authorized for detection and/or diagnosis of SARS-CoV-2 by FDA under an Emergency Use Authorization (EUA). This EUA will remain  in effect (meaning this test can be used) for the duration of the COVID-19 declaration under Section 564(b)(1) of the Act, 21 U.S.C.section 360bbb-3(b)(1), unless the authorization is terminated  or revoked sooner.       Influenza A by PCR NEGATIVE NEGATIVE Final   Influenza B by PCR NEGATIVE NEGATIVE Final    Comment: (NOTE) The Xpert Xpress SARS-CoV-2/FLU/RSV plus assay is intended as an aid in the diagnosis of influenza from Nasopharyngeal swab specimens and should not be used as a sole basis for treatment. Nasal washings and aspirates are unacceptable for Xpert Xpress SARS-CoV-2/FLU/RSV testing.  Fact Sheet for Patients: BloggerCourse.com  Fact Sheet for Healthcare Providers: SeriousBroker.it  This test is not yet approved or cleared by the [place] FDA and has been authorized for detection and/or diagnosis of SARS-CoV-2 by FDA under an Emergency Use Authorization (EUA). This EUA will remain in effect (meaning this test can be used) for the duration of the COVID-19 declaration under Section 564(b)(1) of the Act, 21 U.S.C. section 360bbb-3(b)(1), unless the authorization is terminated or revoked.  Performed at [facility], [address] [place], [place] [address]      Labs: BNP (last 3 results) No results for input(s): BNP in the last 8760 hours. Basic Metabolic Panel: Recent Labs  Lab 05/16/21 1513 05/16/21 1556 05/16/21 2002 05/17/21 0300 05/18/21 0211 05/19/21 0236  NA 157* 160* 154* 151* 147* 146*  K 3.2* 3.3* 3.8 3.8 3.4* 3.5  CL 121* 122* 122* 121*  112* 114*  CO2 27  --  26 24 25 27   GLUCOSE 121* 130* 87 87 111* 123*  BUN 20 20 19 17 10  5*  CREATININE 0.60 0.40* 0.48 0.37* 0.48 0.48  CALCIUM 8.8*  --  8.2* 8.1* 8.7* 8.4*  MG  --   --   --  2.1  --   --   PHOS  --   --   --  2.6  --   --    Liver Function Tests: Recent Labs  Lab 05/16/21 1513  AST 24  ALT 18  ALKPHOS 69  BILITOT 0.8  PROT 6.4*  ALBUMIN 2.4*   No results  for input(s): LIPASE, AMYLASE in the last 168 hours. No results for input(s): AMMONIA in the last 168 hours. CBC: Recent Labs  Lab 05/16/21 1513 05/16/21 1556 05/16/21 2002 05/17/21 0300 05/18/21 0211  WBC 6.5  --   --  6.5 5.2  NEUTROABS 2.9  --   --   --   --   HGB 12.3 13.6 14.8 14.9 14.0  HCT 41.6 40.0 46.9* 45.9 41.6  MCV 93.9  --   --  87.8 83.4  PLT 161  --   --  95* 114*   Cardiac Enzymes: No results for input(s): CKTOTAL, CKMB, CKMBINDEX, TROPONINI in the last 168 hours. BNP: Invalid input(s): POCBNP CBG: Recent Labs  Lab 05/18/21 1220  GLUCAP 111*   D-Dimer No results for input(s): DDIMER in the last 72 hours. Hgb A1c No results for input(s): HGBA1C in the last 72 hours. Lipid Profile No results for input(s): CHOL, HDL, LDLCALC, TRIG, CHOLHDL, LDLDIRECT in the last 72 hours. Thyroid function studies No results for input(s): TSH, T4TOTAL, T3FREE, THYROIDAB in the last 72 hours.  Invalid input(s): FREET3 Anemia work up No results for input(s): VITAMINB12, FOLATE, FERRITIN, TIBC, IRON, RETICCTPCT in the last 72 hours. Urinalysis    Component Value Date/Time   COLORURINE AMBER (A) 05/16/2016 1720   APPEARANCEUR CLOUDY (A) 05/16/2016 1720   LABSPEC 1.033 (H) 05/16/2016 1720   PHURINE 5.0 05/16/2016 1720   GLUCOSEU NEGATIVE 05/16/2016 1720   HGBUR NEGATIVE 05/16/2016 1720   BILIRUBINUR NEGATIVE 05/16/2016 1720   KETONESUR NEGATIVE 05/16/2016 1720   PROTEINUR NEGATIVE 05/16/2016 1720   UROBILINOGEN 0.2 11/10/2014 1757   NITRITE NEGATIVE 05/16/2016 1720   LEUKOCYTESUR  NEGATIVE 05/16/2016 1720   Sepsis Labs Invalid input(s): PROCALCITONIN,  WBC,  LACTICIDVEN Microbiology Recent Results (from the past 240 hour(s))  Resp Panel by RT-PCR (Flu A&B, Covid) Nasopharyngeal Swab     Status: None   Collection Time: 05/16/21  3:37 PM   Specimen: Nasopharyngeal Swab; Nasopharyngeal(NP) swabs in vial transport medium  Result Value Ref Range Status   SARS Coronavirus 2 by RT PCR NEGATIVE NEGATIVE Final    Comment: (NOTE) SARS-CoV-2 target nucleic acids are NOT DETECTED.  The SARS-CoV-2 RNA is generally detectable in upper respiratory specimens during the acute phase of infection. The lowest concentration of SARS-CoV-2 viral copies this assay can detect is 138 copies/mL. A negative result does not preclude SARS-Cov-2 infection and should not be used as the sole basis for treatment or other patient management decisions. A negative result may occur with  improper specimen collection/handling, submission of specimen other than nasopharyngeal swab, presence of viral mutation(s) within the areas targeted by this assay, and inadequate number of viral copies(<138 copies/mL). A negative result must be combined with clinical observations, patient history, and epidemiological information. The expected result is Negative.  Fact Sheet for Patients:  BloggerCourse.comhttps://www.fda.gov/media/152166/download  Fact Sheet for Healthcare Providers:  SeriousBroker.ithttps://www.fda.gov/media/152162/download  This test is no t yet approved or cleared by the [place] FDA and  has been authorized for detection and/or diagnosis of SARS-CoV-2 by FDA under an Emergency Use Authorization (EUA). This EUA will remain  in effect (meaning this test can be used) for the duration of the COVID-19 declaration under Section 564(b)(1) of the Act, 21 U.S.C.section 360bbb-3(b)(1), unless the authorization is terminated  or revoked sooner.       Influenza A by PCR NEGATIVE NEGATIVE Final   Influenza B by PCR  NEGATIVE NEGATIVE Final    Comment: (NOTE) The Xpert Xpress  SARS-CoV-2/FLU/RSV plus assay is intended as an aid in the diagnosis of influenza from Nasopharyngeal swab specimens and should not be used as a sole basis for treatment. Nasal washings and aspirates are unacceptable for Xpert Xpress SARS-CoV-2/FLU/RSV testing.  Fact Sheet for Patients: BloggerCourse.com  Fact Sheet for Healthcare Providers: SeriousBroker.it  This test is not yet approved or cleared by the [place] FDA and has been authorized for detection and/or diagnosis of SARS-CoV-2 by FDA under an Emergency Use Authorization (EUA). This EUA will remain in effect (meaning this test can be used) for the duration of the COVID-19 declaration under Section 564(b)(1) of the Act, 21 U.S.C. section 360bbb-3(b)(1), unless the authorization is terminated or revoked.  Performed at [facility], [address] [place], [place] [address]     Please note: You were cared for by a hospitalist during your hospital stay. Once you are discharged, your primary care physician will handle any further medical issues. Please note that NO REFILLS for any discharge medications will be authorized once you are discharged, as it is imperative that you return to your primary care physician (or establish a relationship with a primary care physician if you do not have one) for your post hospital discharge needs so that they can reassess your need for medications and monitor your lab values.    Time coordinating discharge: 40 minutes  SIGNED:   Burnadette Pop, MD  [facility] 05/22/2021, 1:04 PM Pager [phone]  If 7PM-7AM, please contact night-coverage [email or web address] Password [email or web address]

## 2021-05-22 NOTE — TOC Transition Note (Signed)
Transition of Care [facility]) - CM/SW Discharge Note   Patient Details  Name: Kristi Webb MRN: [id number] Date of Birth: 1932/03/09  [facility] CM/SW Contact:  [name], RN Phone Number: 05/22/2021, 1:11 PM   Clinical Narrative:    Patient is for dc home with hospice, ptar shceduled.  NCM confirmed with [name] and family that the DME is at the home.  NCM  confirmed home address also.  Forms are on the chart with DNR.   Final next level of care: Home w Hospice Care Barriers to Discharge: No Barriers Identified   Patient Goals and CMS Choice Patient states their goals for this hospitalization and ongoing recovery are:: comfort care CMS Medicare.gov Compare Post Acute Care list provided to:: Patient Represenative (must comment) Choice offered to / list presented to : Adult Children  Discharge Placement                       Discharge Plan and Services   Discharge Planning Services: CM Consult Post Acute Care Choice: Hospice          DME Arranged: Hospice Equipment Package Others DME Agency: [employer] Date DME Agency Contacted: 05/19/21   Representative spoke with at DME Agency: Per [facility]   [facility] Agency: [facility] Date [facility] Agency Contacted: 05/19/21 Time HH Agency Contacted: 1215 Representative spoke with at [facility] Agency: [name]  Social Determinants of Health (SDOH) Interventions     Readmission Risk Interventions No flowsheet data found.

## 2021-05-23 DIAGNOSIS — R1312 Dysphagia, oropharyngeal phase: Secondary | ICD-10-CM | POA: Diagnosis not present

## 2021-05-23 DIAGNOSIS — E039 Hypothyroidism, unspecified: Secondary | ICD-10-CM | POA: Diagnosis not present

## 2021-05-23 DIAGNOSIS — E870 Hyperosmolality and hypernatremia: Secondary | ICD-10-CM | POA: Diagnosis not present

## 2021-05-23 DIAGNOSIS — R569 Unspecified convulsions: Secondary | ICD-10-CM | POA: Diagnosis not present

## 2021-05-23 DIAGNOSIS — F0280 Dementia in other diseases classified elsewhere without behavioral disturbance: Secondary | ICD-10-CM | POA: Diagnosis not present

## 2021-05-23 DIAGNOSIS — R410 Disorientation, unspecified: Secondary | ICD-10-CM | POA: Diagnosis not present

## 2021-05-23 DIAGNOSIS — G309 Alzheimer's disease, unspecified: Secondary | ICD-10-CM | POA: Diagnosis not present

## 2021-05-23 DIAGNOSIS — Z8679 Personal history of other diseases of the circulatory system: Secondary | ICD-10-CM | POA: Diagnosis not present

## 2021-05-23 DIAGNOSIS — R978 Other abnormal tumor markers: Secondary | ICD-10-CM | POA: Diagnosis not present

## 2021-05-23 DIAGNOSIS — I10 Essential (primary) hypertension: Secondary | ICD-10-CM | POA: Diagnosis not present

## 2021-05-23 DIAGNOSIS — E43 Unspecified severe protein-calorie malnutrition: Secondary | ICD-10-CM | POA: Diagnosis not present

## 2021-05-23 DIAGNOSIS — Z681 Body mass index (BMI) 19 or less, adult: Secondary | ICD-10-CM | POA: Diagnosis not present

## 2021-05-23 DIAGNOSIS — K922 Gastrointestinal hemorrhage, unspecified: Secondary | ICD-10-CM | POA: Diagnosis not present

## 2021-05-24 DIAGNOSIS — R1312 Dysphagia, oropharyngeal phase: Secondary | ICD-10-CM | POA: Diagnosis not present

## 2021-05-24 DIAGNOSIS — G309 Alzheimer's disease, unspecified: Secondary | ICD-10-CM | POA: Diagnosis not present

## 2021-05-24 DIAGNOSIS — E43 Unspecified severe protein-calorie malnutrition: Secondary | ICD-10-CM | POA: Diagnosis not present

## 2021-05-24 DIAGNOSIS — F0280 Dementia in other diseases classified elsewhere without behavioral disturbance: Secondary | ICD-10-CM | POA: Diagnosis not present

## 2021-05-24 DIAGNOSIS — I10 Essential (primary) hypertension: Secondary | ICD-10-CM | POA: Diagnosis not present

## 2021-05-24 DIAGNOSIS — R569 Unspecified convulsions: Secondary | ICD-10-CM | POA: Diagnosis not present

## 2021-05-25 DIAGNOSIS — E43 Unspecified severe protein-calorie malnutrition: Secondary | ICD-10-CM | POA: Diagnosis not present

## 2021-05-25 DIAGNOSIS — G309 Alzheimer's disease, unspecified: Secondary | ICD-10-CM | POA: Diagnosis not present

## 2021-05-25 DIAGNOSIS — F0280 Dementia in other diseases classified elsewhere without behavioral disturbance: Secondary | ICD-10-CM | POA: Diagnosis not present

## 2021-05-25 DIAGNOSIS — R1312 Dysphagia, oropharyngeal phase: Secondary | ICD-10-CM | POA: Diagnosis not present

## 2021-05-25 DIAGNOSIS — I10 Essential (primary) hypertension: Secondary | ICD-10-CM | POA: Diagnosis not present

## 2021-05-25 DIAGNOSIS — R569 Unspecified convulsions: Secondary | ICD-10-CM | POA: Diagnosis not present

## 2021-05-26 DIAGNOSIS — F0280 Dementia in other diseases classified elsewhere without behavioral disturbance: Secondary | ICD-10-CM | POA: Diagnosis not present

## 2021-05-26 DIAGNOSIS — E43 Unspecified severe protein-calorie malnutrition: Secondary | ICD-10-CM | POA: Diagnosis not present

## 2021-05-26 DIAGNOSIS — G309 Alzheimer's disease, unspecified: Secondary | ICD-10-CM | POA: Diagnosis not present

## 2021-05-26 DIAGNOSIS — R569 Unspecified convulsions: Secondary | ICD-10-CM | POA: Diagnosis not present

## 2021-05-26 DIAGNOSIS — R1312 Dysphagia, oropharyngeal phase: Secondary | ICD-10-CM | POA: Diagnosis not present

## 2021-05-26 DIAGNOSIS — I10 Essential (primary) hypertension: Secondary | ICD-10-CM | POA: Diagnosis not present

## 2021-05-27 DIAGNOSIS — I10 Essential (primary) hypertension: Secondary | ICD-10-CM | POA: Diagnosis not present

## 2021-05-27 DIAGNOSIS — G309 Alzheimer's disease, unspecified: Secondary | ICD-10-CM | POA: Diagnosis not present

## 2021-05-27 DIAGNOSIS — E43 Unspecified severe protein-calorie malnutrition: Secondary | ICD-10-CM | POA: Diagnosis not present

## 2021-05-27 DIAGNOSIS — F0280 Dementia in other diseases classified elsewhere without behavioral disturbance: Secondary | ICD-10-CM | POA: Diagnosis not present

## 2021-05-27 DIAGNOSIS — R569 Unspecified convulsions: Secondary | ICD-10-CM | POA: Diagnosis not present

## 2021-05-27 DIAGNOSIS — R1312 Dysphagia, oropharyngeal phase: Secondary | ICD-10-CM | POA: Diagnosis not present

## 2021-05-28 DIAGNOSIS — G309 Alzheimer's disease, unspecified: Secondary | ICD-10-CM | POA: Diagnosis not present

## 2021-05-28 DIAGNOSIS — R1312 Dysphagia, oropharyngeal phase: Secondary | ICD-10-CM | POA: Diagnosis not present

## 2021-05-28 DIAGNOSIS — F0280 Dementia in other diseases classified elsewhere without behavioral disturbance: Secondary | ICD-10-CM | POA: Diagnosis not present

## 2021-05-28 DIAGNOSIS — I10 Essential (primary) hypertension: Secondary | ICD-10-CM | POA: Diagnosis not present

## 2021-05-28 DIAGNOSIS — R569 Unspecified convulsions: Secondary | ICD-10-CM | POA: Diagnosis not present

## 2021-05-28 DIAGNOSIS — E43 Unspecified severe protein-calorie malnutrition: Secondary | ICD-10-CM | POA: Diagnosis not present

## 2021-05-29 DIAGNOSIS — E43 Unspecified severe protein-calorie malnutrition: Secondary | ICD-10-CM | POA: Diagnosis not present

## 2021-05-29 DIAGNOSIS — I10 Essential (primary) hypertension: Secondary | ICD-10-CM | POA: Diagnosis not present

## 2021-05-29 DIAGNOSIS — G309 Alzheimer's disease, unspecified: Secondary | ICD-10-CM | POA: Diagnosis not present

## 2021-05-29 DIAGNOSIS — F0280 Dementia in other diseases classified elsewhere without behavioral disturbance: Secondary | ICD-10-CM | POA: Diagnosis not present

## 2021-05-29 DIAGNOSIS — R569 Unspecified convulsions: Secondary | ICD-10-CM | POA: Diagnosis not present

## 2021-05-29 DIAGNOSIS — R1312 Dysphagia, oropharyngeal phase: Secondary | ICD-10-CM | POA: Diagnosis not present

## 2021-05-30 DIAGNOSIS — R1312 Dysphagia, oropharyngeal phase: Secondary | ICD-10-CM | POA: Diagnosis not present

## 2021-05-30 DIAGNOSIS — I10 Essential (primary) hypertension: Secondary | ICD-10-CM | POA: Diagnosis not present

## 2021-05-30 DIAGNOSIS — R569 Unspecified convulsions: Secondary | ICD-10-CM | POA: Diagnosis not present

## 2021-05-30 DIAGNOSIS — F0280 Dementia in other diseases classified elsewhere without behavioral disturbance: Secondary | ICD-10-CM | POA: Diagnosis not present

## 2021-05-30 DIAGNOSIS — G309 Alzheimer's disease, unspecified: Secondary | ICD-10-CM | POA: Diagnosis not present

## 2021-05-30 DIAGNOSIS — E43 Unspecified severe protein-calorie malnutrition: Secondary | ICD-10-CM | POA: Diagnosis not present

## 2021-06-01 DEATH — deceased

## 2021-10-28 ENCOUNTER — Other Ambulatory Visit: Payer: Self-pay | Admitting: Nurse Practitioner

## 2021-10-28 DIAGNOSIS — F0280 Dementia in other diseases classified elsewhere without behavioral disturbance: Secondary | ICD-10-CM

## 2021-10-28 DIAGNOSIS — E039 Hypothyroidism, unspecified: Secondary | ICD-10-CM
